# Patient Record
Sex: Male | Born: 1955 | Race: White | Hispanic: Yes | Marital: Married | Smoking: Former smoker
Health system: Southern US, Community
[De-identification: ages and names within clinical notes are randomized; demographics above are authoritative.]

## PROBLEM LIST (undated history)

## (undated) DIAGNOSIS — F32A Depression, unspecified: Secondary | ICD-10-CM

## (undated) DIAGNOSIS — R011 Cardiac murmur, unspecified: Secondary | ICD-10-CM

## (undated) DIAGNOSIS — R040 Epistaxis: Secondary | ICD-10-CM

## (undated) DIAGNOSIS — F988 Other specified behavioral and emotional disorders with onset usually occurring in childhood and adolescence: Secondary | ICD-10-CM

## (undated) DIAGNOSIS — Z87442 Personal history of urinary calculi: Secondary | ICD-10-CM

## (undated) DIAGNOSIS — E119 Type 2 diabetes mellitus without complications: Secondary | ICD-10-CM

## (undated) DIAGNOSIS — M1990 Unspecified osteoarthritis, unspecified site: Secondary | ICD-10-CM

## (undated) DIAGNOSIS — F429 Obsessive-compulsive disorder, unspecified: Secondary | ICD-10-CM

## (undated) DIAGNOSIS — F329 Major depressive disorder, single episode, unspecified: Secondary | ICD-10-CM

## (undated) DIAGNOSIS — Z9989 Dependence on other enabling machines and devices: Secondary | ICD-10-CM

## (undated) DIAGNOSIS — E785 Hyperlipidemia, unspecified: Secondary | ICD-10-CM

## (undated) DIAGNOSIS — IMO0001 Reserved for inherently not codable concepts without codable children: Secondary | ICD-10-CM

## (undated) DIAGNOSIS — I701 Atherosclerosis of renal artery: Secondary | ICD-10-CM

## (undated) DIAGNOSIS — G4730 Sleep apnea, unspecified: Secondary | ICD-10-CM

## (undated) DIAGNOSIS — N200 Calculus of kidney: Secondary | ICD-10-CM

## (undated) DIAGNOSIS — F419 Anxiety disorder, unspecified: Secondary | ICD-10-CM

## (undated) DIAGNOSIS — M5126 Other intervertebral disc displacement, lumbar region: Secondary | ICD-10-CM

## (undated) DIAGNOSIS — I10 Essential (primary) hypertension: Secondary | ICD-10-CM

## (undated) DIAGNOSIS — M51369 Other intervertebral disc degeneration, lumbar region without mention of lumbar back pain or lower extremity pain: Secondary | ICD-10-CM

## (undated) DIAGNOSIS — S2239XA Fracture of one rib, unspecified side, initial encounter for closed fracture: Secondary | ICD-10-CM

## (undated) DIAGNOSIS — M5136 Other intervertebral disc degeneration, lumbar region: Secondary | ICD-10-CM

## (undated) DIAGNOSIS — N400 Enlarged prostate without lower urinary tract symptoms: Secondary | ICD-10-CM

## (undated) DIAGNOSIS — G4733 Obstructive sleep apnea (adult) (pediatric): Secondary | ICD-10-CM

## (undated) HISTORY — DX: Unspecified osteoarthritis, unspecified site: M19.90

## (undated) HISTORY — DX: Dependence on other enabling machines and devices: Z99.89

## (undated) HISTORY — DX: Sleep apnea, unspecified: G47.30

## (undated) HISTORY — DX: Benign prostatic hyperplasia without lower urinary tract symptoms: N40.0

## (undated) HISTORY — PX: REPLACEMENT TOTAL KNEE BILATERAL: SUR1225

## (undated) HISTORY — DX: Depression, unspecified: F32.A

## (undated) HISTORY — DX: Reserved for inherently not codable concepts without codable children: IMO0001

## (undated) HISTORY — DX: Other specified behavioral and emotional disorders with onset usually occurring in childhood and adolescence: F98.8

## (undated) HISTORY — DX: Major depressive disorder, single episode, unspecified: F32.9

## (undated) HISTORY — DX: Calculus of kidney: N20.0

## (undated) HISTORY — DX: Hyperlipidemia, unspecified: E78.5

## (undated) HISTORY — DX: Anxiety disorder, unspecified: F41.9

## (undated) HISTORY — PX: NASAL SINUS SURGERY: SHX719

## (undated) HISTORY — DX: Obstructive sleep apnea (adult) (pediatric): G47.33

## (undated) HISTORY — DX: Type 2 diabetes mellitus without complications: E11.9

## (undated) HISTORY — PX: JOINT REPLACEMENT: SHX530

## (undated) HISTORY — PX: FRACTURE SURGERY: SHX138

## (undated) HISTORY — DX: Essential (primary) hypertension: I10

## (undated) HISTORY — DX: Cardiac murmur, unspecified: R01.1

## (undated) HISTORY — PX: MULTIPLE TOOTH EXTRACTIONS: SHX2053

## (undated) HISTORY — PX: LITHOTRIPSY: SUR834

## (undated) HISTORY — PX: FEMUR FRACTURE SURGERY: SHX633

## (undated) HISTORY — PX: KNEE SURGERY: SHX244

## (undated) HISTORY — PX: COLONOSCOPY: SHX174

---

## 1976-06-28 HISTORY — PX: DEROTATIONAL TIBIAL OSTEOTOMY: SHX1453

## 2003-04-05 ENCOUNTER — Encounter: Admission: RE | Admit: 2003-04-05 | Discharge: 2003-04-05 | Payer: Self-pay | Admitting: Family Medicine

## 2003-04-05 ENCOUNTER — Encounter: Payer: Self-pay | Admitting: Family Medicine

## 2004-12-24 ENCOUNTER — Encounter: Payer: Self-pay | Admitting: Internal Medicine

## 2006-03-03 ENCOUNTER — Ambulatory Visit (HOSPITAL_COMMUNITY): Admission: RE | Admit: 2006-03-03 | Discharge: 2006-03-03 | Payer: Self-pay | Admitting: Urology

## 2006-09-13 ENCOUNTER — Ambulatory Visit: Payer: Self-pay | Admitting: Cardiology

## 2006-09-13 LAB — CONVERTED CEMR LAB
BUN: 20 mg/dL (ref 6–23)
CO2: 29 meq/L (ref 19–32)
GFR calc Af Amer: 82 mL/min
Potassium: 3.9 meq/L (ref 3.5–5.1)
Sodium: 140 meq/L (ref 135–145)

## 2006-09-23 ENCOUNTER — Ambulatory Visit: Payer: Self-pay | Admitting: Cardiology

## 2006-09-23 LAB — CONVERTED CEMR LAB
BUN: 22 mg/dL (ref 6–23)
CO2: 25 meq/L (ref 19–32)
Calcium: 10.2 mg/dL (ref 8.4–10.5)
GFR calc Af Amer: 91 mL/min
GFR calc non Af Amer: 75 mL/min
Glucose, Bld: 155 mg/dL — ABNORMAL HIGH (ref 70–99)
Potassium: 4.3 meq/L (ref 3.5–5.1)

## 2006-10-06 ENCOUNTER — Ambulatory Visit: Payer: Self-pay | Admitting: Cardiology

## 2006-11-10 ENCOUNTER — Ambulatory Visit: Payer: Self-pay

## 2006-11-29 ENCOUNTER — Ambulatory Visit: Payer: Self-pay | Admitting: Internal Medicine

## 2006-11-29 ENCOUNTER — Encounter: Payer: Self-pay | Admitting: Internal Medicine

## 2006-11-29 DIAGNOSIS — M1990 Unspecified osteoarthritis, unspecified site: Secondary | ICD-10-CM

## 2006-11-29 DIAGNOSIS — E119 Type 2 diabetes mellitus without complications: Secondary | ICD-10-CM

## 2006-11-29 DIAGNOSIS — E785 Hyperlipidemia, unspecified: Secondary | ICD-10-CM

## 2006-11-29 DIAGNOSIS — I10 Essential (primary) hypertension: Secondary | ICD-10-CM

## 2006-11-29 LAB — CONVERTED CEMR LAB
Cholesterol, target level: 200 mg/dL
HDL goal, serum: 40 mg/dL
LDL Goal: 100 mg/dL

## 2006-12-08 ENCOUNTER — Observation Stay (HOSPITAL_COMMUNITY): Admission: EM | Admit: 2006-12-08 | Discharge: 2006-12-09 | Payer: Self-pay | Admitting: Emergency Medicine

## 2006-12-12 ENCOUNTER — Ambulatory Visit (HOSPITAL_COMMUNITY): Admission: RE | Admit: 2006-12-12 | Discharge: 2006-12-12 | Payer: Self-pay | Admitting: Urology

## 2007-01-03 ENCOUNTER — Ambulatory Visit: Payer: Self-pay | Admitting: Cardiology

## 2007-01-04 ENCOUNTER — Encounter: Payer: Self-pay | Admitting: Internal Medicine

## 2007-01-04 ENCOUNTER — Ambulatory Visit (HOSPITAL_BASED_OUTPATIENT_CLINIC_OR_DEPARTMENT_OTHER): Admission: RE | Admit: 2007-01-04 | Discharge: 2007-01-04 | Payer: Self-pay | Admitting: Internal Medicine

## 2007-01-10 ENCOUNTER — Ambulatory Visit: Payer: Self-pay | Admitting: Internal Medicine

## 2007-01-16 ENCOUNTER — Ambulatory Visit: Payer: Self-pay | Admitting: Gastroenterology

## 2007-01-18 ENCOUNTER — Ambulatory Visit: Payer: Self-pay | Admitting: Pulmonary Disease

## 2007-01-25 ENCOUNTER — Ambulatory Visit: Payer: Self-pay | Admitting: Internal Medicine

## 2007-01-25 DIAGNOSIS — Z87442 Personal history of urinary calculi: Secondary | ICD-10-CM

## 2007-01-25 LAB — HM DIABETES FOOT EXAM

## 2007-01-27 ENCOUNTER — Ambulatory Visit: Payer: Self-pay | Admitting: Gastroenterology

## 2007-01-27 LAB — HM COLONOSCOPY: HM Colonoscopy: NORMAL

## 2007-02-10 ENCOUNTER — Ambulatory Visit: Payer: Self-pay | Admitting: Internal Medicine

## 2007-02-14 ENCOUNTER — Encounter: Payer: Self-pay | Admitting: Internal Medicine

## 2007-02-21 ENCOUNTER — Telehealth: Payer: Self-pay | Admitting: Internal Medicine

## 2007-02-21 ENCOUNTER — Encounter: Admission: RE | Admit: 2007-02-21 | Discharge: 2007-05-22 | Payer: Self-pay | Admitting: Internal Medicine

## 2007-02-21 ENCOUNTER — Encounter: Payer: Self-pay | Admitting: Internal Medicine

## 2007-02-28 LAB — CONVERTED CEMR LAB
ALT: 40 units/L (ref 0–53)
AST: 26 units/L (ref 0–37)
Albumin: 4.2 g/dL (ref 3.5–5.2)
Alkaline Phosphatase: 42 units/L (ref 39–117)
BUN: 21 mg/dL (ref 6–23)
Bilirubin, Direct: 0.1 mg/dL (ref 0.0–0.3)
CO2: 26 meq/L (ref 19–32)
Calcium: 9.3 mg/dL (ref 8.4–10.5)
Chloride: 106 meq/L (ref 96–112)
Cholesterol: 136 mg/dL (ref 0–200)
Creatinine, Ser: 1.2 mg/dL (ref 0.4–1.5)
GFR calc Af Amer: 82 mL/min
GFR calc non Af Amer: 68 mL/min
Glucose, Bld: 139 mg/dL — ABNORMAL HIGH (ref 70–99)
HDL: 25.6 mg/dL — ABNORMAL LOW (ref >39.0)
LDL Cholesterol: 15 mg/dL (ref 0–99)
Potassium: 4.2 meq/L (ref 3.5–5.1)
Sodium: 141 meq/L (ref 135–145)
Total Bilirubin: 1.1 mg/dL (ref 0.3–1.2)
Total CHOL/HDL Ratio: 5.3
Total Protein: 7.3 g/dL (ref 6.0–8.3)
Triglycerides: 476 mg/dL (ref 0–149)
VLDL: 95 mg/dL — ABNORMAL HIGH (ref 0–40)

## 2007-03-25 ENCOUNTER — Encounter: Admission: RE | Admit: 2007-03-25 | Discharge: 2007-03-25 | Payer: Self-pay | Admitting: Otolaryngology

## 2007-03-27 ENCOUNTER — Ambulatory Visit: Payer: Self-pay | Admitting: Pulmonary Disease

## 2007-04-21 DIAGNOSIS — G4733 Obstructive sleep apnea (adult) (pediatric): Secondary | ICD-10-CM | POA: Insufficient documentation

## 2007-04-24 ENCOUNTER — Encounter: Payer: Self-pay | Admitting: Internal Medicine

## 2007-04-29 LAB — CONVERTED CEMR LAB: PSA: NORMAL ng/mL

## 2007-05-15 ENCOUNTER — Ambulatory Visit: Payer: Self-pay | Admitting: Internal Medicine

## 2007-05-15 LAB — CONVERTED CEMR LAB
ALT: 30 units/L (ref 0–53)
AST: 23 units/L (ref 0–37)
Albumin: 4.5 g/dL (ref 3.5–5.2)
Alkaline Phosphatase: 47 units/L (ref 39–117)
BUN: 33 mg/dL — ABNORMAL HIGH (ref 6–23)
Bilirubin, Direct: 0.2 mg/dL (ref 0.0–0.3)
CO2: 26 meq/L (ref 19–32)
Calcium: 9.7 mg/dL (ref 8.4–10.5)
Chloride: 103 meq/L (ref 96–112)
Cholesterol: 126 mg/dL (ref 0–200)
Creatinine, Ser: 1.5 mg/dL (ref 0.4–1.5)
Direct LDL: 54.1 mg/dL
GFR calc Af Amer: 63 mL/min
GFR calc non Af Amer: 52 mL/min
Glucose, Bld: 188 mg/dL — ABNORMAL HIGH (ref 70–99)
HDL: 23.9 mg/dL — ABNORMAL LOW (ref >39.0)
Hgb A1c MFr Bld: 6.8 % — ABNORMAL HIGH (ref 4.6–6.0)
Potassium: 5 meq/L (ref 3.5–5.1)
Sodium: 137 meq/L (ref 135–145)
Total Bilirubin: 1 mg/dL (ref 0.3–1.2)
Total CHOL/HDL Ratio: 5.3
Total Protein: 7.7 g/dL (ref 6.0–8.3)
Triglycerides: 302 mg/dL (ref 0–149)
VLDL: 60 mg/dL — ABNORMAL HIGH (ref 0–40)

## 2007-05-22 ENCOUNTER — Ambulatory Visit: Payer: Self-pay | Admitting: Internal Medicine

## 2007-05-22 DIAGNOSIS — F329 Major depressive disorder, single episode, unspecified: Secondary | ICD-10-CM

## 2007-05-22 DIAGNOSIS — F419 Anxiety disorder, unspecified: Secondary | ICD-10-CM | POA: Insufficient documentation

## 2007-06-15 ENCOUNTER — Observation Stay (HOSPITAL_COMMUNITY): Admission: RE | Admit: 2007-06-15 | Discharge: 2007-06-16 | Payer: Self-pay | Admitting: Surgery

## 2007-06-15 ENCOUNTER — Encounter (INDEPENDENT_AMBULATORY_CARE_PROVIDER_SITE_OTHER): Payer: Self-pay | Admitting: Otolaryngology

## 2007-07-28 ENCOUNTER — Ambulatory Visit: Payer: Self-pay | Admitting: Internal Medicine

## 2007-08-01 ENCOUNTER — Telehealth: Payer: Self-pay | Admitting: Internal Medicine

## 2007-09-20 ENCOUNTER — Ambulatory Visit: Payer: Self-pay | Admitting: Internal Medicine

## 2007-09-20 LAB — CONVERTED CEMR LAB
AST: 30 units/L (ref 0–37)
Albumin: 4.6 g/dL (ref 3.5–5.2)
Alkaline Phosphatase: 44 units/L (ref 39–117)
BUN: 27 mg/dL — ABNORMAL HIGH (ref 6–23)
Cholesterol: 170 mg/dL (ref 0–200)
Creatinine, Ser: 1.3 mg/dL (ref 0.4–1.5)
GFR calc Af Amer: 75 mL/min
Glucose, Bld: 210 mg/dL — ABNORMAL HIGH (ref 70–99)
HDL: 30.8 mg/dL — ABNORMAL LOW (ref >39.0)
Hgb A1c MFr Bld: 7.9 % — ABNORMAL HIGH (ref 4.6–6.0)
Potassium: 5.3 meq/L — ABNORMAL HIGH (ref 3.5–5.1)
Total Protein: 7.3 g/dL (ref 6.0–8.3)
VLDL: 101 mg/dL — ABNORMAL HIGH (ref 0–40)

## 2007-09-27 ENCOUNTER — Ambulatory Visit: Payer: Self-pay | Admitting: Internal Medicine

## 2007-09-28 ENCOUNTER — Ambulatory Visit: Payer: Self-pay | Admitting: Internal Medicine

## 2007-10-04 ENCOUNTER — Telehealth: Payer: Self-pay | Admitting: Internal Medicine

## 2007-10-05 ENCOUNTER — Telehealth: Payer: Self-pay | Admitting: Internal Medicine

## 2007-10-11 ENCOUNTER — Telehealth: Payer: Self-pay | Admitting: Internal Medicine

## 2007-10-16 ENCOUNTER — Encounter: Payer: Self-pay | Admitting: Internal Medicine

## 2008-01-22 ENCOUNTER — Ambulatory Visit: Payer: Self-pay | Admitting: Internal Medicine

## 2008-01-22 LAB — CONVERTED CEMR LAB
ALT: 43 units/L (ref 0–53)
AST: 31 units/L (ref 0–37)
Alkaline Phosphatase: 49 units/L (ref 39–117)
Bilirubin, Direct: 0.1 mg/dL (ref 0.0–0.3)
CO2: 26 meq/L (ref 19–32)
Chloride: 103 meq/L (ref 96–112)
Glucose, Bld: 195 mg/dL — ABNORMAL HIGH (ref 70–99)
Sodium: 139 meq/L (ref 135–145)
Total Bilirubin: 0.8 mg/dL (ref 0.3–1.2)
Total CHOL/HDL Ratio: 4.8

## 2008-01-29 ENCOUNTER — Ambulatory Visit: Payer: Self-pay | Admitting: Internal Medicine

## 2008-02-05 ENCOUNTER — Telehealth: Payer: Self-pay | Admitting: Internal Medicine

## 2008-02-16 ENCOUNTER — Ambulatory Visit: Payer: Self-pay | Admitting: Internal Medicine

## 2008-02-16 LAB — CONVERTED CEMR LAB
BUN: 28 mg/dL — ABNORMAL HIGH (ref 6–23)
CO2: 26 meq/L (ref 19–32)
Chloride: 105 meq/L (ref 96–112)
Creatinine, Ser: 1.4 mg/dL (ref 0.4–1.5)
Glucose, Bld: 180 mg/dL — ABNORMAL HIGH (ref 70–99)

## 2008-02-20 ENCOUNTER — Telehealth: Payer: Self-pay | Admitting: Internal Medicine

## 2008-07-22 IMAGING — CT CT ABDOMEN W/O CM
1 series · 15 of 32 positions shown, 19 images · IV contrast (agent unspecified)
Comparison: None.

CLINICAL DATA: 50-year-old with left flank pain and hematuria.  
 ABDOMEN CT WITHOUT CONTRAST:
TECHNIQUE: Multidetector CT imaging of the abdomen was performed following the standard protocol without IV contrast.
TECHNIQUE: Multidetector CT imaging of the pelvis was performed following the standard protocol without IV contrast.

[Series 2: stone_wo 5.0 b40f st · axial · 0.88mm/px · z∈[-474,-70]mm · 15 of 113 slices shown, 19 images]
[im 8/113  soft-tissue]
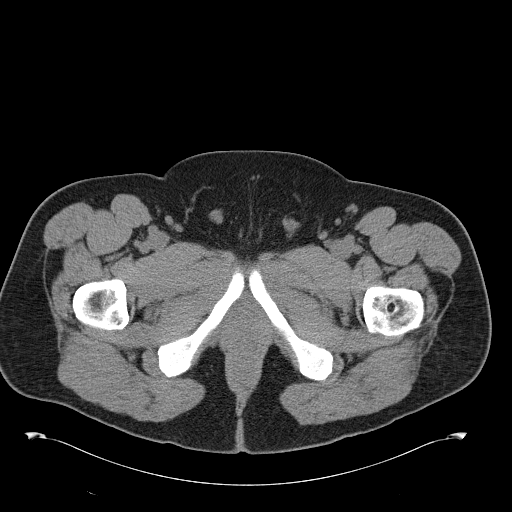
[im 8/113  bone]
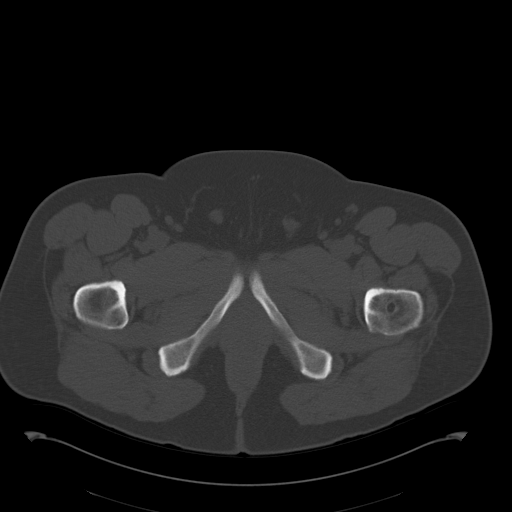
[im 15/113  soft-tissue]
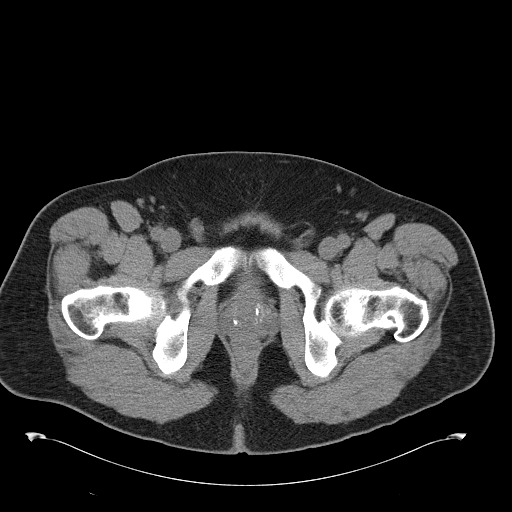
[im 22/113  soft-tissue]
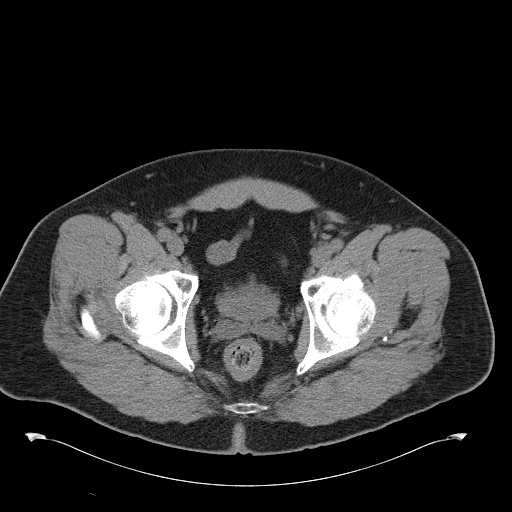
[im 33/113  soft-tissue]
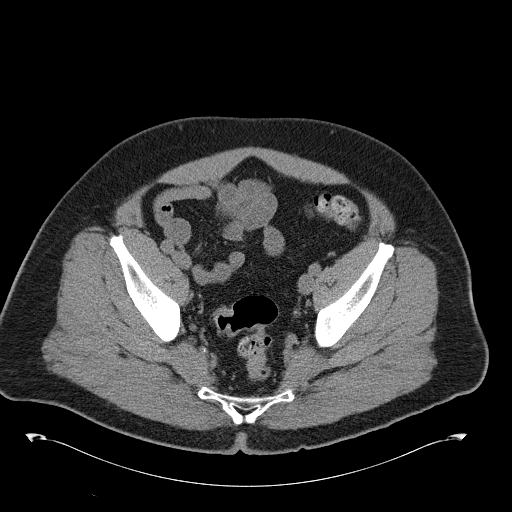
[im 40/113  soft-tissue]
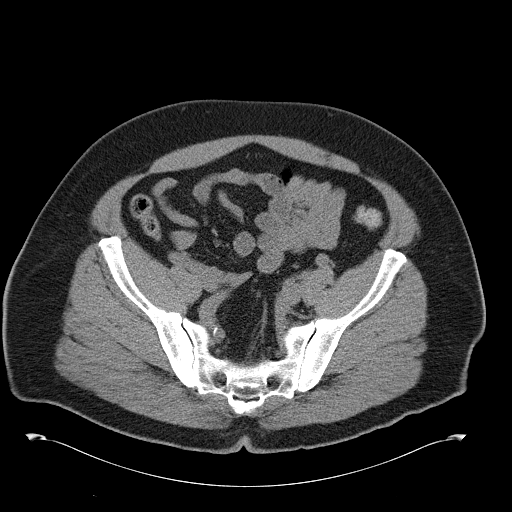
[im 47/113  soft-tissue]
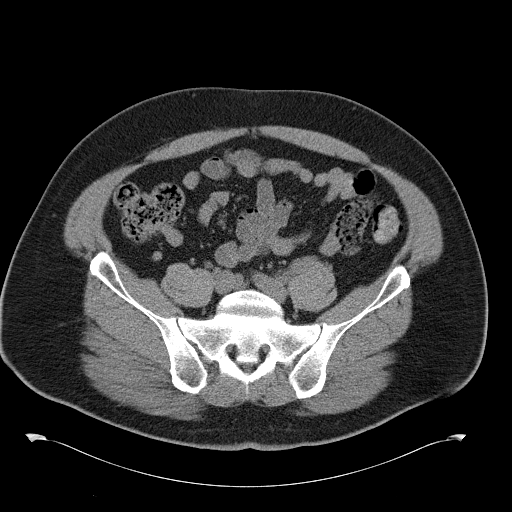
[im 58/113  soft-tissue]
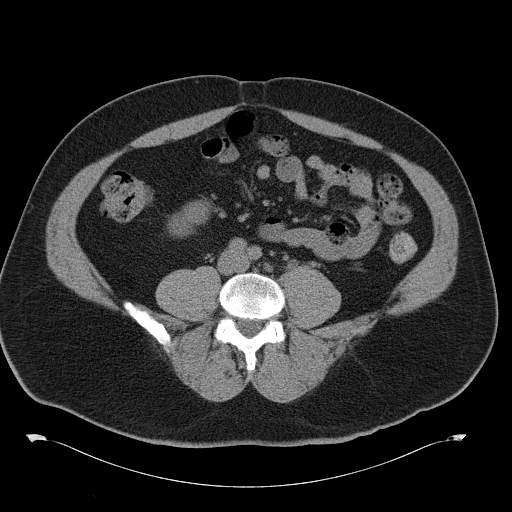
[im 66/113  soft-tissue]
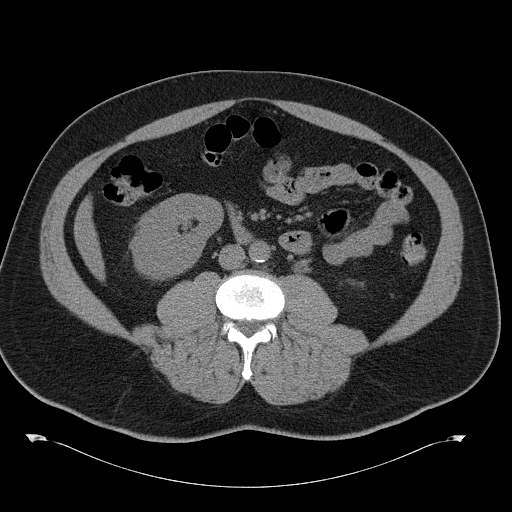
[im 73/113  soft-tissue]
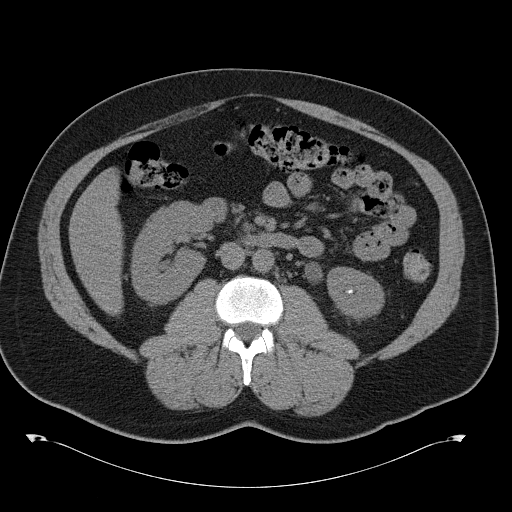
[im 73/113  bone]
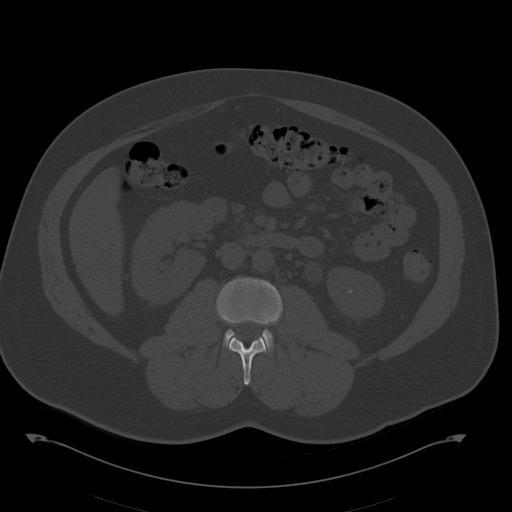
[im 80/113  soft-tissue]
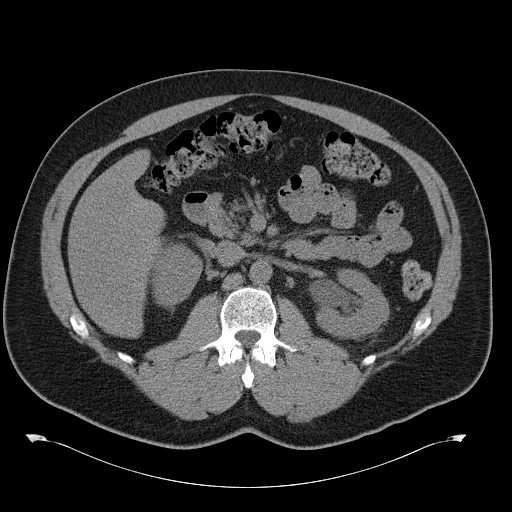
[im 91/113  soft-tissue]
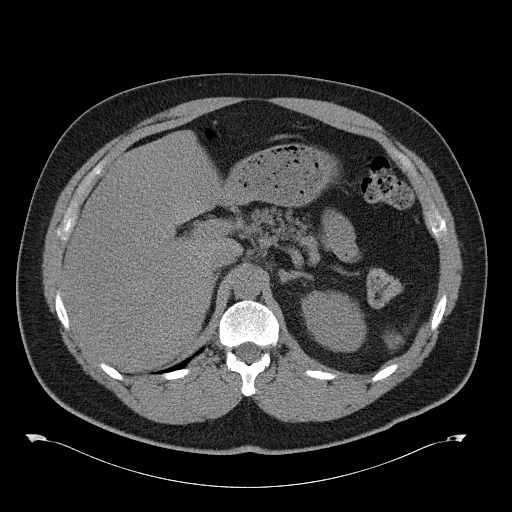
[im 98/113  soft-tissue]
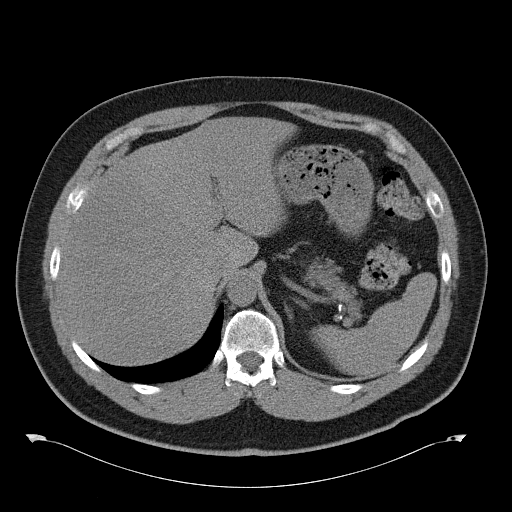
[im 98/113  lung]
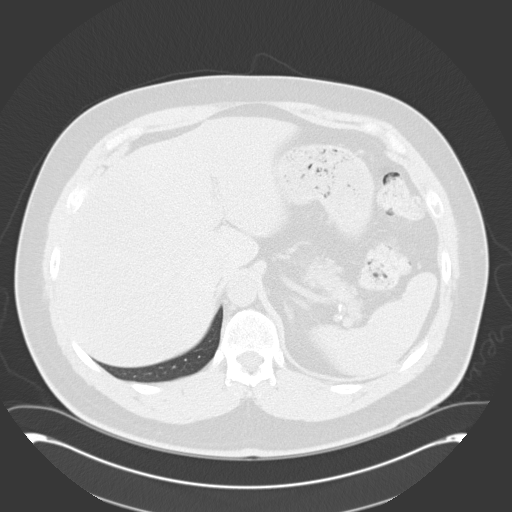
[im 102/113  lung]
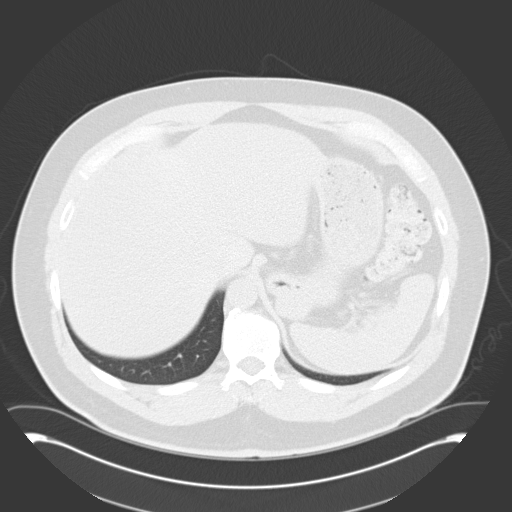
[im 105/113  soft-tissue]
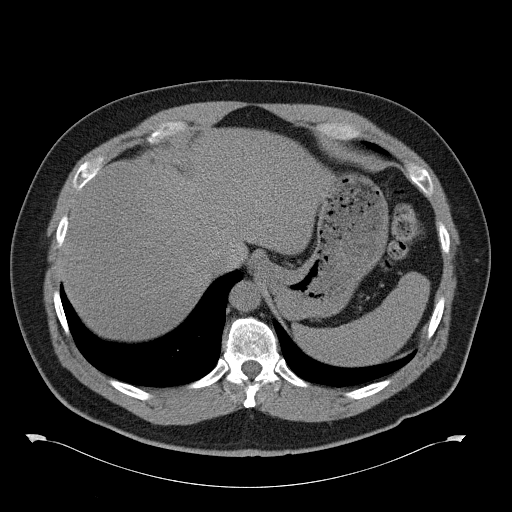
[im 105/113  lung]
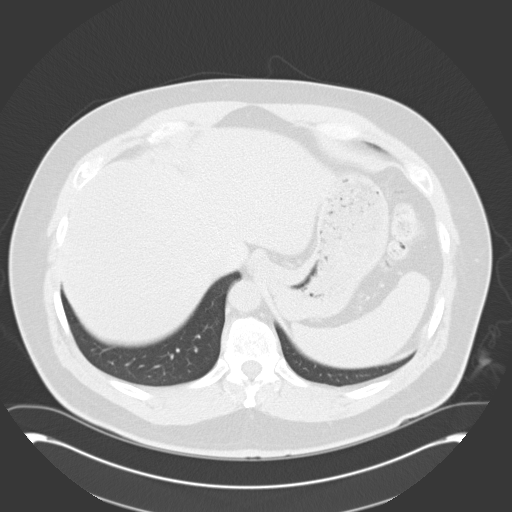
[im 109/113  lung]
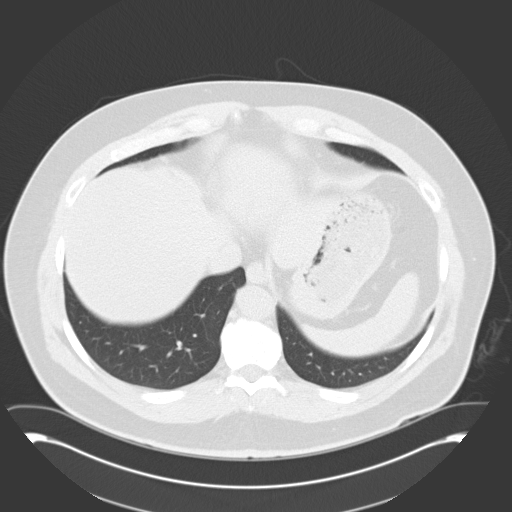

[15 of 32 positions shown; findings below may reference images not displayed]

FINDINGS: The lung bases are clear.  
 The unenhanced appearance of the liver and spleen are unremarkable.  Gallbladder is slightly contracted.  The pancreas demonstrates no abnormalities.  The adrenal glands are normal.  Left kidney is normal.  There is a small lower pole left renal calculus and there is moderate hydronephrosis of the left kidney.  The right ureter is dilated down to a large, 10.0 x 9.8 x 8.0 mm calculus which is located near the L5-S1 disc space.  Below this, the ureter appears normal.  There may be a tiny stone fragment just above the large dome.  Right ureter is normal.  The appendix is visualized and is normal.  The aorta is normal in caliber.  Stomach, duodenum, small bowel and colon are grossly normal.  The study is limited without oral contrast.
IMPRESSION: 1.  10.0 x 9.8 x 8.0 mm calculus in the left mid ureter at the L5-S1 disc space level causing moderate grade obstruction. 
 2.  Lower pole left renal calculus. 
 3.  Remainder of the abdomen is unremarkable.  
 PELVIS CT WITHOUT CONTRAST:
FINDINGS: The rectum, sigmoid colon, and visualized small bowel loops are unremarkable.  Mild diverticulosis of the sigmoid colon.  No distal ureteral calculi or bladder calculi.  
 No significant bony findings.
IMPRESSION: Unremarkable CT pelvis.

## 2008-09-11 ENCOUNTER — Encounter: Payer: Self-pay | Admitting: Cardiology

## 2008-09-11 ENCOUNTER — Ambulatory Visit: Payer: Self-pay | Admitting: Cardiology

## 2008-09-18 ENCOUNTER — Ambulatory Visit: Payer: Self-pay | Admitting: Cardiology

## 2008-09-18 LAB — CONVERTED CEMR LAB
BUN: 20 mg/dL (ref 6–23)
CO2: 29 meq/L (ref 19–32)
Calcium: 9.2 mg/dL (ref 8.4–10.5)
Creatinine, Ser: 1.2 mg/dL (ref 0.4–1.5)
Glucose, Bld: 212 mg/dL — ABNORMAL HIGH (ref 70–99)
Sodium: 138 meq/L (ref 135–145)

## 2008-10-08 ENCOUNTER — Ambulatory Visit: Payer: Self-pay | Admitting: Internal Medicine

## 2008-10-10 ENCOUNTER — Telehealth (INDEPENDENT_AMBULATORY_CARE_PROVIDER_SITE_OTHER): Payer: Self-pay | Admitting: *Deleted

## 2008-10-10 ENCOUNTER — Ambulatory Visit: Payer: Self-pay

## 2008-10-10 ENCOUNTER — Encounter: Payer: Self-pay | Admitting: Cardiology

## 2008-10-10 ENCOUNTER — Ambulatory Visit: Payer: Self-pay | Admitting: Cardiology

## 2008-10-10 LAB — CONVERTED CEMR LAB
AST: 33 units/L (ref 0–37)
BUN: 28 mg/dL — ABNORMAL HIGH (ref 6–23)
Basophils Relative: 0.6 % (ref 0.0–3.0)
CO2: 24 meq/L (ref 19–32)
Chloride: 102 meq/L (ref 96–112)
Creatinine, Ser: 1.2 mg/dL (ref 0.4–1.5)
Eosinophils Absolute: 0.2 10*3/uL (ref 0.0–0.7)
Hemoglobin: 14.6 g/dL (ref 13.0–17.0)
Lymphocytes Relative: 33.1 % (ref 12.0–46.0)
MCHC: 34.2 g/dL (ref 30.0–36.0)
Neutro Abs: 3.4 10*3/uL (ref 1.4–7.7)
RBC: 4.75 M/uL (ref 4.22–5.81)

## 2008-10-21 ENCOUNTER — Encounter: Payer: Self-pay | Admitting: Internal Medicine

## 2008-10-22 ENCOUNTER — Telehealth (INDEPENDENT_AMBULATORY_CARE_PROVIDER_SITE_OTHER): Payer: Self-pay | Admitting: *Deleted

## 2008-11-26 ENCOUNTER — Telehealth (INDEPENDENT_AMBULATORY_CARE_PROVIDER_SITE_OTHER): Payer: Self-pay | Admitting: *Deleted

## 2008-11-26 ENCOUNTER — Ambulatory Visit: Payer: Self-pay | Admitting: Cardiology

## 2008-11-27 LAB — CONVERTED CEMR LAB
Bilirubin, Direct: 0.1 mg/dL (ref 0.0–0.3)
Eosinophils Absolute: 0.3 10*3/uL (ref 0.0–0.7)
MCHC: 35.3 g/dL (ref 30.0–36.0)
MCV: 87.5 fL (ref 78.0–100.0)
Monocytes Absolute: 0.9 10*3/uL (ref 0.1–1.0)
Neutrophils Relative %: 52.4 % (ref 43.0–77.0)
Platelets: 304 10*3/uL (ref 150.0–400.0)
RDW: 12.4 % (ref 11.5–14.6)
Total Bilirubin: 1.1 mg/dL (ref 0.3–1.2)
WBC: 7.4 10*3/uL (ref 4.5–10.5)

## 2008-12-25 LAB — CONVERTED CEMR LAB: PSA: 0.51 ng/mL

## 2009-01-06 ENCOUNTER — Telehealth (INDEPENDENT_AMBULATORY_CARE_PROVIDER_SITE_OTHER): Payer: Self-pay | Admitting: *Deleted

## 2009-01-17 ENCOUNTER — Telehealth (INDEPENDENT_AMBULATORY_CARE_PROVIDER_SITE_OTHER): Payer: Self-pay | Admitting: *Deleted

## 2009-01-22 ENCOUNTER — Ambulatory Visit: Payer: Self-pay | Admitting: Internal Medicine

## 2009-01-27 ENCOUNTER — Telehealth: Payer: Self-pay | Admitting: Internal Medicine

## 2009-01-27 LAB — CONVERTED CEMR LAB
CO2: 26 meq/L (ref 19–32)
Calcium: 9.4 mg/dL (ref 8.4–10.5)
Chloride: 108 meq/L (ref 96–112)
Cholesterol: 133 mg/dL (ref 0–200)
Creatinine,U: 148.5 mg/dL
Direct LDL: 48.1 mg/dL
Hgb A1c MFr Bld: 8 % — ABNORMAL HIGH (ref 4.6–6.5)
Microalb, Ur: 5.2 mg/dL — ABNORMAL HIGH (ref 0.0–1.9)
Sodium: 139 meq/L (ref 135–145)
Triglycerides: 405 mg/dL — ABNORMAL HIGH (ref 0.0–149.0)

## 2009-01-28 ENCOUNTER — Encounter: Payer: Self-pay | Admitting: Internal Medicine

## 2009-01-28 LAB — HM DIABETES EYE EXAM: HM Diabetic Eye Exam: NORMAL

## 2009-02-12 ENCOUNTER — Encounter: Payer: Self-pay | Admitting: Internal Medicine

## 2009-02-24 ENCOUNTER — Encounter: Payer: Self-pay | Admitting: Internal Medicine

## 2009-04-17 ENCOUNTER — Ambulatory Visit: Payer: Self-pay | Admitting: Internal Medicine

## 2009-04-21 ENCOUNTER — Telehealth: Payer: Self-pay | Admitting: Internal Medicine

## 2009-04-25 ENCOUNTER — Ambulatory Visit: Payer: Self-pay | Admitting: Internal Medicine

## 2009-05-01 ENCOUNTER — Telehealth (INDEPENDENT_AMBULATORY_CARE_PROVIDER_SITE_OTHER): Payer: Self-pay | Admitting: *Deleted

## 2009-05-12 ENCOUNTER — Encounter (INDEPENDENT_AMBULATORY_CARE_PROVIDER_SITE_OTHER): Payer: Self-pay | Admitting: *Deleted

## 2009-05-30 ENCOUNTER — Ambulatory Visit: Payer: Self-pay | Admitting: Internal Medicine

## 2009-08-29 ENCOUNTER — Ambulatory Visit: Payer: Self-pay | Admitting: Internal Medicine

## 2009-09-03 LAB — CONVERTED CEMR LAB
CO2: 28 meq/L (ref 19–32)
Calcium: 9.8 mg/dL (ref 8.4–10.5)
Chloride: 106 meq/L (ref 96–112)
Cholesterol: 132 mg/dL (ref 0–200)
Glucose, Bld: 132 mg/dL — ABNORMAL HIGH (ref 70–99)
HDL: 40.7 mg/dL (ref >39.00)
Sodium: 141 meq/L (ref 135–145)
Total CHOL/HDL Ratio: 3
Triglycerides: 126 mg/dL (ref 0.0–149.0)

## 2009-10-14 ENCOUNTER — Encounter: Payer: Self-pay | Admitting: Internal Medicine

## 2009-10-25 ENCOUNTER — Encounter: Admission: RE | Admit: 2009-10-25 | Discharge: 2009-10-25 | Payer: Self-pay | Admitting: Orthopedic Surgery

## 2009-11-11 ENCOUNTER — Telehealth (INDEPENDENT_AMBULATORY_CARE_PROVIDER_SITE_OTHER): Payer: Self-pay | Admitting: *Deleted

## 2009-12-31 ENCOUNTER — Ambulatory Visit: Payer: Self-pay | Admitting: Internal Medicine

## 2010-01-01 LAB — CONVERTED CEMR LAB
BUN: 28 mg/dL — ABNORMAL HIGH (ref 6–23)
Basophils Relative: 0.6 % (ref 0.0–3.0)
Creatinine, Ser: 1.2 mg/dL (ref 0.4–1.5)
Eosinophils Absolute: 0.3 10*3/uL (ref 0.0–0.7)
Eosinophils Relative: 4 % (ref 0.0–5.0)
GFR calc non Af Amer: 70.46 mL/min (ref >60)
Glucose, Bld: 190 mg/dL — ABNORMAL HIGH (ref 70–99)
HCT: 42.9 % (ref 39.0–52.0)
Hemoglobin: 14.9 g/dL (ref 13.0–17.0)
Lymphs Abs: 2 10*3/uL (ref 0.7–4.0)
MCHC: 34.8 g/dL (ref 30.0–36.0)
MCV: 89.6 fL (ref 78.0–100.0)
Monocytes Absolute: 0.8 10*3/uL (ref 0.1–1.0)
Neutro Abs: 5.2 10*3/uL (ref 1.4–7.7)
Neutrophils Relative %: 62.7 % (ref 43.0–77.0)
RBC: 4.79 M/uL (ref 4.22–5.81)
WBC: 8.4 10*3/uL (ref 4.5–10.5)

## 2010-01-13 ENCOUNTER — Encounter: Payer: Self-pay | Admitting: Internal Medicine

## 2010-02-09 ENCOUNTER — Ambulatory Visit: Payer: Self-pay | Admitting: Pulmonary Disease

## 2010-02-25 ENCOUNTER — Ambulatory Visit: Payer: Self-pay | Admitting: Cardiology

## 2010-03-25 ENCOUNTER — Encounter: Payer: Self-pay | Admitting: Pulmonary Disease

## 2010-03-26 ENCOUNTER — Ambulatory Visit: Payer: Self-pay | Admitting: Cardiology

## 2010-03-26 ENCOUNTER — Ambulatory Visit: Payer: Self-pay

## 2010-03-26 DIAGNOSIS — R9431 Abnormal electrocardiogram [ECG] [EKG]: Secondary | ICD-10-CM

## 2010-03-30 ENCOUNTER — Telehealth (INDEPENDENT_AMBULATORY_CARE_PROVIDER_SITE_OTHER): Payer: Self-pay | Admitting: *Deleted

## 2010-03-31 ENCOUNTER — Ambulatory Visit: Payer: Self-pay | Admitting: Cardiology

## 2010-03-31 ENCOUNTER — Ambulatory Visit: Payer: Self-pay

## 2010-03-31 ENCOUNTER — Encounter: Payer: Self-pay | Admitting: Cardiovascular Disease

## 2010-03-31 ENCOUNTER — Encounter: Payer: Self-pay | Admitting: Cardiology

## 2010-03-31 ENCOUNTER — Encounter (HOSPITAL_COMMUNITY): Admission: RE | Admit: 2010-03-31 | Discharge: 2010-04-13 | Payer: Self-pay | Admitting: Cardiology

## 2010-03-31 ENCOUNTER — Encounter (INDEPENDENT_AMBULATORY_CARE_PROVIDER_SITE_OTHER): Payer: Self-pay | Admitting: *Deleted

## 2010-05-04 ENCOUNTER — Ambulatory Visit: Payer: Self-pay | Admitting: Cardiology

## 2010-05-06 ENCOUNTER — Encounter: Payer: Self-pay | Admitting: Cardiology

## 2010-05-06 LAB — CONVERTED CEMR LAB
Albumin: 4.4 g/dL (ref 3.5–5.2)
Cholesterol: 129 mg/dL (ref 0–200)
Total Bilirubin: 0.9 mg/dL (ref 0.3–1.2)
Total CHOL/HDL Ratio: 4
Triglycerides: 207 mg/dL — ABNORMAL HIGH (ref 0.0–149.0)
VLDL: 41.4 mg/dL — ABNORMAL HIGH (ref 0.0–40.0)

## 2010-06-03 ENCOUNTER — Encounter: Payer: Self-pay | Admitting: Pulmonary Disease

## 2010-06-09 ENCOUNTER — Ambulatory Visit: Payer: Self-pay | Admitting: Internal Medicine

## 2010-07-20 ENCOUNTER — Encounter: Payer: Self-pay | Admitting: Otolaryngology

## 2010-07-24 ENCOUNTER — Telehealth (INDEPENDENT_AMBULATORY_CARE_PROVIDER_SITE_OTHER): Payer: Self-pay | Admitting: *Deleted

## 2010-07-30 NOTE — Letter (Signed)
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 [address]   [place], [place] [address]   Phone: [phone]  Fax: [phone]         May 06, 2010 MRN: [id number]   Leonard [name] [address] [place], [place]  [address]   Dear Mr. Stehle,  We have reviewed your cholesterol results.  They are as follows:     Total Cholesterol:    129 (Desirable: less than 200)       HDL  Cholesterol:     33.40  (Desirable: greater than 40 for men and 50 for women)       LDL Cholesterol:       66  (Desirable: less than 100 for low risk and less than 70 for moderate to high risk)       Triglycerides:       207.0  (Desirable: less than 150)  Our recommendations include:  No change in treatment, continue current medications and healthy life style.  Hope all is well with you!   Call our office at the number listed above if you have any questions.  Lowering your LDL cholesterol is important, but it is only one of a large number of "risk factors" that may indicate that you are at risk for heart disease, stroke or other complications of hardening of the arteries.  Other risk factors include:   A.  Cigarette Smoking* B.  High Blood Pressure* C.  Obesity* D.   Low HDL Cholesterol (see yours above)* E.   Diabetes Mellitus (higher risk if your is uncontrolled) F.  Family history of premature heart disease G.  Previous history of stroke or cardiovascular disease    *These are risk factors YOU HAVE CONTROL OVER.  For more information, visit .      There is now evidence that lowering the TOTAL CHOLESTEROL AND LDL CHOLESTEROL can reduce the risk of heart disease.  The American Heart Association recommends the following guidelines for the treatment of elevated cholesterol:  1.  If there is now current heart disease and less than two risk factors, TOTAL CHOLESTEROL should be less than 200 and LDL CHOLESTEROL should be less than 100. 2.  If there is current heart disease  or two or more risk factors, TOTAL CHOLESTEROL should be less than 200 and LDL CHOLESTEROL should be less than 70.  A diet low in cholesterol, saturated fat, and calories is the cornerstone of treatment for elevated cholesterol.  Cessation of smoking and exercise are also important in the management of elevated cholesterol and preventing vascular disease.  Studies have shown that 30 to 60 minutes of physical activity most days can help lower blood pressure, lower cholesterol, and keep your weight at a healthy level.  Drug therapy is used when cholesterol levels do not respond to therapeutic lifestyle changes (smoking cessation, diet, and exercise) and remains unacceptably high.  If medication is started, it is important to have you levels checked periodically to evaluate the need for further treatment options.      Thank you,     Sander Nephew, RN for Dr. Rollene Rotunda [facility] Team
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
## 2010-07-30 NOTE — Miscellaneous (Signed)
Summary: order for GXT  Clinical Lists Changes  Orders: Added new Referral order of Treadmill (Treadmill) - Signed

## 2010-07-30 NOTE — Letter (Signed)
Summary: CMN for CPAP/[facility]  CMN for CPAP/[facility]   Imported By: Lanelle Bal 10/16/2009 10:02:44  _____________________________________________________________________  External Attachment:    Type:   Image     Comment:   External Document

## 2010-07-30 NOTE — Assessment & Plan Note (Signed)
Summary: Cardiology Nuclear Testing  Nuclear Med Background Indications for Stress Test: Evaluation for Ischemia  Indications Comments: Abnormal GXT  History: GXT, Myocardial Perfusion Study  History Comments: '08 [id number]   Symptoms Comments: No cardiac complaints   Nuclear Pre-Procedure Cardiac Risk Factors: History of Smoking, Hypertension, Lipids, NIDDM, Obesity Caffeine/Decaff Intake: none NPO After: 6:30 PM Lungs: Clear IV 0.9% NS with Angio Cath: 22g     IV Site: R Wrist IV Started by: Cathlyn Parsons, RN Chest Size (in) 46     Height (in): 72 Weight (lb): 253 BMI: 34.44  Nuclear Med Study 1 or 2 day study:  1 day     Stress Test Type:  Stress Reading MD:  Peter Swaziland, MD     Referring MD:  Rollene Rotunda, MD Resting Radionuclide:  Technetium 41m Tetrofosmin     Resting Radionuclide Dose:  11 mCi  Stress Radionuclide:  Technetium 85m Tetrofosmin     Stress Radionuclide Dose:  33 mCi   Stress Protocol Exercise Time (min):  12:45 min     Max HR:  179 bpm     Predicted Max HR:  166 bpm  Max Systolic BP: 192 mm Hg     Percent Max HR:  107.83 %     METS: 14.7 Rate Pressure Product:  14782    Stress Test Technologist:  [employer], CMA-N     Nuclear Technologist:  Domenic Polite, CNMT  Rest Procedure  Myocardial perfusion imaging was performed at rest 45 minutes following the intravenous administration of Technetium 42m Tetrofosmin.  Stress Procedure  The patient exercised for 12:45.  The patient stopped due to fatigue and denied any chest pain.  There were ST-T wave changes with exercise and occasional PVC's with rare couplet, occasional PAC's.  Technetium 53m Tetrofosmin was injected at peak exercise and myocardial perfusion imaging was performed after a brief delay.  QPS Raw Data Images:  Normal; no motion artifact; normal heart/lung ratio. Stress Images:  Mild apical thinning, otherwise normal. Rest Images:  Mild apical thinning, otherwise  normal. Subtraction (SDS):  Normal Transient Ischemic Dilatation:  .95  (Normal <1.22)  Lung/Heart Ratio:  .31  (Normal <0.45)  Quantitative Gated Spect Images QGS EDV:  139 ml QGS ESV:  66 ml QGS EF:  52 % QGS cine images:  Normal wall motion.  Findings Normal nuclear study      Overall Impression  Exercise Capacity: Excellent exercise capacity. BP Response: Hypertensive blood pressure response. Clinical Symptoms: No chest pain ECG Impression: Significant ST abnormalities consistent with ischemia. Overall Impression: Normal stress nuclear study. Overall Impression Comments: Mild apical thinning. No ischemia.   Appended Document: Cardiology Nuclear Testing Negative nuclear study.  OK to resume exercise.    Appended Document: Cardiology Nuclear Testing pt aware of results

## 2010-07-30 NOTE — Miscellaneous (Signed)
Summary: nuc study l/l orders  Clinical Lists Changes  Problems: Added new problem of ABNORMAL STRESS ELECTROCARDIOGRAM (ICD-794.31) Orders: Added new Referral order of Nuclear Stress Test (Nuc Stress Test) - Signed Observations: Added new observation of PI CARDIO: Your physician recommends that you schedule a follow-up appointment after stress test Your physician recommends that you return for a FASTING lipid and liver profile: on the same day as your stress test Your physician recommends that you continue on your current medications as directed. Please refer to the Current Medication list given to you today. Your physician has requested that you have an exercise stress myoview.  For further information please visit [email or web address]  Please follow instruction sheet, as given. (03/26/2010 15:45)      Patient Instructions: 1)  Your physician recommends that you schedule a follow-up appointment after stress test 2)  Your physician recommends that you return for a FASTING lipid and liver profile: on the same day as your stress test 3)  Your physician recommends that you continue on your current medications as directed. Please refer to the Current Medication list given to you today. 4)  Your physician has requested that you have an exercise stress myoview.  For further information please visit [email or web address]  Please follow instruction sheet, as given.

## 2010-07-30 NOTE — Letter (Signed)
Summary: stone followup---[facility]   [facility]   Imported By: Lanelle Bal 01/27/2010 12:39:06  _____________________________________________________________________  External Attachment:    Type:   Image     Comment:   External Document  Appended Document: Orders Update  downalod  7/1 - 03/25/10 >> good compliance, avg pr 7.4 cm, AHI 1.5 cm, no leak, change to fixed pr 8 cm Let him know we have sent Rx to change settings to fixed pressure.   Clinical Lists Changes  Orders: Added new Referral order of DME Referral (DME) - Signed      Appended Document: sleep issues/jd pt informed and stated he will take machine to DME this afternoon. jwr

## 2010-07-30 NOTE — Progress Notes (Signed)
Summary: Refill Request  Phone Note Refill Request Call back at [phone] Message from:  Pharmacy on Nov 11, 2009 10:14 AM  Refills Requested: Medication #1:  GLIPIZIDE 10 MG TABLET, take 1 tablet by mouth twice a day   Dosage confirmed as above?Dosage Confirmed   Supply Requested: 1 month   Last Refilled: 07/01/2009 [facility] on [address]   Next Appointment Scheduled: 7.6.11 Initial call taken by: Harold Barban,  Nov 11, 2009 10:15 AM  Follow-up for Phone Call        SPOKE WITH PHARMACIST INFORMED THIS RX WAS REMOVED FROM  MED LIST .Kandice Hams  Nov 11, 2009 2:33 PM  Follow-up by: Kandice Hams,  Nov 11, 2009 2:33 PM
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
## 2010-07-30 NOTE — Letter (Signed)
Summary: Outpatient Coinsurance Notice  Outpatient Coinsurance Notice   Imported By: Marylou Mccoy 04/08/2010 12:06:07  _____________________________________________________________________  External Attachment:    Type:   Image     Comment:   External Document

## 2010-07-30 NOTE — Assessment & Plan Note (Signed)
Summary: sleep issues/Leonard [name]   Visit Type:  Initial Consult Primary Leonard [name]/Referring Leonard [name]:  [name], MD  CC:  Pt here to become re-established.  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  The sleep efficiency of 65%.  Although the oral AHI was 8 per hour, but REM related AHI was31.8.  The lowest desaturation was 87%.  Longest hypopnea was 43 seconds.  His lowest heart rate was 52 beats per minute during non-REM sleep.  No PLMs or arrhythmias were noted.  August15, 2011  Had good results with autoCPAP but lost to FU x 3 yrs. Feels more refreshed. He has lost from 295 to 258 lbs  Preventive Screening-Counseling & Management  Alcohol-Tobacco     Smoking Status: quit     Year Quit: 2005  Caffeine-Diet-Exercise     Does Patient Exercise: no   History of Present Illness: Sleep disorder-CPAP user Sleep Apnea  What time do you typically go to bed?(between what hours): 11:00pm  How long does it take you to fall asleep? 10 minutes  How many times during the night do you wake up? 2  What time do you get out of bed to start your day? 7:00am  Do you drive or operate heavy machinery in your occupation? no  How much has your weight changed (up or down) over the past two years? (in pounds): 45 decrease   Do you currently use CPAP ? If so , at what pressure? yes-18  Do you wear oxygen at any time? If yes, how many liters per minute? no Current Medications (verified): 1)  Amlodipine Besy-Benazepril Hcl 10-20 Mg Caps (Amlodipine Besy-Benazepril Hcl) .[name].. 1 By Mouth Qpm 2)  Benazepril Hcl 20 Mg Tabs (Benazepril Hcl) .... One By Mouth Every Am 3)  Hydrochlorothiazide 25 Mg Tabs (Hydrochlorothiazide) .... Take 1 Tablet By Mouth Every Morning 4)  Simvastatin 40 Mg Tabs (Simvastatin) .... Take 1 Tablet  By Mouth At Bedtime 5)  Janumet 50-500 Mg  Tabs (Sitagliptin-Metformin Hcl) .... Take 1 Tablet By Mouth Two Times A Day 6)  Glipizide 10 Mg Tabs (Glipizide) .... 1/2 Once Daily. 7)  Flomax 0.4 Mg  Cp24 (Tamsulosin Hcl) .[name].. 1 By Mouth At Bedtime 8)  K Citrate .... Two Times A Day 9)  Allopurinol 300 Mg  Tabs (Allopurinol) .... Take 1 Tablet By Mouth Once A Day 10)  Fish Oil 1000 Mg  Caps (Omega-3 Fatty Acids) .[name].. 1 Two Times A Day 11)  Vitamin D 1000 Unit  Tabs (Cholecalciferol) .... Once Daily 12)  Accu-Chek Compact   Strp (Glucose Blood) .... Check Blood Sugar Daily 13)  Multivitamins   Tabs (Multiple Vitamin) .... Qd 14)  Vitamin E 1000 Unit  Caps (Vitamin E) .... Once Daily 15)  Clonazepam 0.5 Mg Tbdp (Clonazepam) .... One or Two At Bedtime As Needed For Anxiety or Insomnia 16)  Diclofenac Potassium 50 Mg Tabs (Diclofenac Potassium) .... 2x As Needed 17)  Cpap .[name].. 18 Max Ahc  Allergies (verified): No Known Drug Allergies  Past History:  Past Medical History: Last updated: 12/31/2009 Diabetes mellitus, type II Hyperlipidemia Hypertension Osteoarthritis-knees, recommended replaement by Dr. 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  The patient denies shortness of breath with activity, shortness of breath at rest, productive cough, non-productive cough, coughing up blood, chest pain, irregular heartbeats, acid heartburn, indigestion, loss of appetite, weight change, abdominal pain, difficulty swallowing, sore throat, tooth/dental problems, headaches, nasal congestion/difficulty  breathing through nose, sneezing, itching, ear ache, hand/feet swelling, joint stiffness or pain, rash, change in color of mucus, and fever.    Vital Signs:  Patient profile:   55 year old male Height:      72 inches Weight:      258.13 pounds BMI:     35.14 O2 Sat:      98 % on Room air Temp:     98.3 degrees F oral Pulse rate:   75 / minute BP sitting:   110 / 78  (left arm) Cuff size:   large  Vitals Entered By: Zackery Barefoot CMA (February 09, 2010 3:08 PM)  O2 Flow:  Room air CC: Pt here to become re-established Comments Medications reviewed with patient Verified contact number and pharmacy with patient Zackery Barefoot CMA  February 09, 2010 3:09 PM    Physical Exam  Additional Exam:  Gen. Pleasant, well-nourished, in no distress, normal affect ENT - no lesions, no post nasal drip, class 2 airway Neck: No JVD, no thyromegaly, no carotid bruits Lungs: no use of accessory muscles, no dullness to percussion, clear without rales or rhonchi  Cardiovascular: Rhythm regular, heart sounds  normal, no murmurs or gallops, no peripheral edema Abdomen: soft and non-tender, no hepatosplenomegaly, BS normal. Musculoskeletal: No deformities, no cyanosis or clubbing Neuro:  alert, non focal     Impression & Recommendations:  Problem # 1:  OBSTRUCTIVE SLEEP APNEA (ICD-327.23) The pathophysiology of obstructive sleep apnea, it's cardiovascular consequences and modes of treatment including CPAP were discussed with the patient in great detail.  ct autoCPAP, obtain download & adjust to fixed level CPAP as needed Compliance encouraged, wt loss emphasized, asked to avoid meds with sedative side effects, cautioned against driving when sleepy.  if he loses suficicent wt, revisit degree of obstructive sleep apnea  Orders: DME Referral (DME) Consultation Level III (62130)  Medications Added to Medication List This Visit: 1)  Cpap  .[name].. 18 max ahc  Patient Instructions: 1)  Copy sent to:  Dr Drue Novel 2)  Please schedule a follow-up appointment in 1 year. 3)  We will give you feedback once we see a download  Appended Document: Orders Update  downalod  7/1 - 03/25/10 >> good compliance, avg pr 7.4 cm, AHI 1.5 cm, no leak, change to fixed pr 8 cm Let him know we have sent Rx to change settings to fixed pressure.   Clinical Lists Changes  Orders: Added new Referral order of DME Referral (DME) - Signed      Appended Document: sleep issues/Leonard [name] pt informed and stated he will take machine to DME this afternoon. jwr  Appended Document: sleep issues/Leonard [name] download 10/28 - 06/03/10 >> good compliance, good control of events <2/h, minimal leak  Appended Document: sleep issues/Leonard [name] Pt informed no changes at this time. Pt c/o snoring with mask after adjusting Ramping. Was advised by Mardelle Matte that if continues will make arrangement to restrain mouth.
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
## 2010-07-30 NOTE — Progress Notes (Signed)
Summary: Nuclear Pre-Procedure  Phone Note Outgoing Call   Call placed by: Milana Na, EMT-P,  March 30, 2010 3:33 PM Summary of Call: Left message with information on [employer] Information Sheet (see scanned document for details).      Nuclear Med Background Indications for Stress Test: Evaluation for Ischemia   History: GXT, Myocardial Perfusion Study  History Comments: 05/08 MPS (-) ischemia 56% Thinning inferior     Nuclear Pre-Procedure Cardiac Risk Factors: Hypertension, Lipids, NIDDM Height (in): 72  Nuclear Med Study Referring MD:  J.Hochrein

## 2010-08-05 ENCOUNTER — Other Ambulatory Visit: Payer: Self-pay | Admitting: Internal Medicine

## 2010-08-05 ENCOUNTER — Encounter: Payer: Self-pay | Admitting: Internal Medicine

## 2010-08-05 ENCOUNTER — Encounter (INDEPENDENT_AMBULATORY_CARE_PROVIDER_SITE_OTHER): Payer: BC Managed Care – PPO | Admitting: Internal Medicine

## 2010-08-05 DIAGNOSIS — Z0000 Encounter for general adult medical examination without abnormal findings: Secondary | ICD-10-CM

## 2010-08-05 DIAGNOSIS — E119 Type 2 diabetes mellitus without complications: Secondary | ICD-10-CM

## 2010-08-05 DIAGNOSIS — I10 Essential (primary) hypertension: Secondary | ICD-10-CM

## 2010-08-05 DIAGNOSIS — M1990 Unspecified osteoarthritis, unspecified site: Secondary | ICD-10-CM

## 2010-08-05 DIAGNOSIS — E785 Hyperlipidemia, unspecified: Secondary | ICD-10-CM

## 2010-08-05 LAB — CBC WITH DIFFERENTIAL/PLATELET
Basophils Absolute: 0 10*3/uL (ref 0.0–0.1)
Eosinophils Absolute: 0.2 10*3/uL (ref 0.0–0.7)
HCT: 41.9 % (ref 39.0–52.0)
Hemoglobin: 14.4 g/dL (ref 13.0–17.0)
Lymphs Abs: 1.9 10*3/uL (ref 0.7–4.0)
MCHC: 34.3 g/dL (ref 30.0–36.0)
Monocytes Relative: 6.9 % (ref 3.0–12.0)
Neutro Abs: 5.7 10*3/uL (ref 1.4–7.7)
Platelets: 275 10*3/uL (ref 150.0–400.0)
RDW: 13 % (ref 11.5–14.6)

## 2010-08-05 LAB — BASIC METABOLIC PANEL
BUN: 27 mg/dL — ABNORMAL HIGH (ref 6–23)
CO2: 28 mEq/L (ref 19–32)
Calcium: 9.5 mg/dL (ref 8.4–10.5)
GFR: 72.48 mL/min (ref >60.00)
Glucose, Bld: 167 mg/dL — ABNORMAL HIGH (ref 70–99)
Potassium: 4.1 mEq/L (ref 3.5–5.1)
Sodium: 141 mEq/L (ref 135–145)

## 2010-08-05 LAB — HEMOGLOBIN A1C: Hgb A1c MFr Bld: 6.7 % — ABNORMAL HIGH (ref 4.6–6.5)

## 2010-08-05 NOTE — Progress Notes (Signed)
Summary: CPX--sched = 2/8  Phone Note Outgoing Call   Call placed by: Army Fossa CMA,  July 24, 2010 1:06 PM Summary of Call: Pt is due for a CPX. Army Fossa CMA  July 24, 2010 1:06 PM   Follow-up for Phone Call        patient has appointment for physical on for 08/05/2010 at 1:30pm Follow-up by: Jerolyn Shin,  July 29, 2010 4:11 PM
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
## 2010-09-16 ENCOUNTER — Other Ambulatory Visit: Payer: Self-pay | Admitting: Internal Medicine

## 2010-10-22 ENCOUNTER — Other Ambulatory Visit: Payer: Self-pay | Admitting: Internal Medicine

## 2010-10-23 NOTE — Telephone Encounter (Signed)
60, 3 RF 

## 2010-10-24 ENCOUNTER — Other Ambulatory Visit: Payer: Self-pay | Admitting: Cardiology

## 2010-10-24 DIAGNOSIS — I10 Essential (primary) hypertension: Secondary | ICD-10-CM

## 2010-10-27 ENCOUNTER — Other Ambulatory Visit: Payer: Self-pay | Admitting: Internal Medicine

## 2010-10-27 NOTE — Telephone Encounter (Signed)
Okay for a year 

## 2010-11-10 NOTE — Op Note (Signed)
NAMEROBEN, SCHLIEP NO.:  [id number]   MEDICAL RECORD NO.:  [id number]          PATIENT TYPE:  OBV   LOCATION:  1418                         FACILITY:  [facility]   PHYSICIAN:  Jamison Neighbor, M.D.  DATE OF BIRTH:  1955/12/28   DATE OF PROCEDURE:  12/09/2006  DATE OF DISCHARGE:                               OPERATIVE REPORT   PREOPERATIVE DIAGNOSIS:  1. Left ureteral calculus with hydronephrosis.  2. Moderate renal insufficiency exacerbated by hydronephrosis.   POSTOPERATIVE DIAGNOSIS:  1. Left ureteral calculus with hydronephrosis.  2. Moderate renal insufficiency exacerbated by hydronephrosis.   PROCEDURE:  Cystoscopy, bilateral retrograde, and a left double-J  catheter insertion.   SURGEON:  Jamison Neighbor, M.D.   ANESTHESIA:  General.   COMPLICATIONS:  None.   DRAINS:  6-French x 26 cm double J catheter   BRIEF HISTORY:  This 55 year old male has a past history of kidney  stones.  He presented to the hospital last night where he was found to  have some degree of renal insufficiency with a rise in his creatinine up  to 2.3.  One of potential reasons for this is the patient was found to  have a stone at the L5-S1 level in the distal left ureter with  associated hydronephrosis.  The patient does, however, describe recent  problems with diarrhea secondary to the initiation of therapy with  metformin. He also states that he had been out in the sun working and  may have become tired in that fashion.  The patient had his creatinine  rechecked today and it had come down to 1.6 which was closed to his  baseline of 1.4.  He is concerned, however, that he does not wish to  have pain on the left hand side but would like to go ahead and have  eventual ESWL. After some discussion, we decided that the best option  was to place a double-J catheter today, let him go home, and plan for  lithotripsy next week if, of course, the stone has not broken or passed,  he can  have in situ laser lithotripsy, but he would like to try the less  invasive therapy first. He understands the risks and benefits of the  procedure and gave full informed consent.   DESCRIPTION OF PROCEDURE:  After successful induction of general  anesthesia, the patient was placed in the dorsal lithotomy position,  prepped with Betadine, and draped in the usual sterile fashion.  Cystoscopy was performed, the urethra was visualized in its entirety and  found to be normal. Beyond the verumontanum, there was minimal prostatic  enlargement and no evidence of bladder obstruction.  The bladder was  carefully inspected and tumors or stones could be seen.  There was no  trabeculation and no irregularities of the mucosa. Bilateral retrograde  studies were performed. On the right hand side, a 6-French ureteral  catheter was inserted through the opening and a retrograde study was  done under visualization with fluoroscopy.  The ureter had a normal  caliber, the contrast ascended up the ureter, and the collecting system  was free of any filling  defects or other irregularities.  The drain out  on that side was normal. On the left hand side, a 6-French ureteral  catheter was also inserted into the ureter.  The contrast was injected  and a stone could be seen at the junction of the lumbar and sacral  vertebra.  There was hydronephrosis above that level with some clubbing  of the collecting system.  No other filling defects could be identified.  A drain out film showed that there was obstruction at the level of the  stone.   After the completion of the bilateral diagnostic retrogrades, a  guidewire was passed up to the kidney where it coil normally within the  pelvis.  A 6-French x 26 cm double-J catheter was passed over the wire  allowed to coil normally within the collecting system as well as within  the bladder.  The bladder was drained and the patient tolerated the  procedure well and was taken to  the recovery room in good condition.  He  will be sent home with Flomax to be taken one daily to try to help with  stone passage. He will not Lorcet 10 to take as needed for pain and  Pyridium Plus.  We will try to schedule him for ESWL next week.           ______________________________  Jamison Neighbor, M.D.  Electronically Signed     RJE/MEDQ  D:  12/09/2006  T:  12/10/2006  Job:  [id number]

## 2010-11-10 NOTE — Op Note (Signed)
NAMERAMAJ, FRANGOS NO.:  [id number]   MEDICAL RECORD NO.:  [id number]          PATIENT TYPE:  OBV   LOCATION:  3315                         FACILITY:  [facility]   PHYSICIAN:  Kristine Garbe. Ezzard Standing, M.D.DATE OF BIRTH:  [birth year]   DATE OF PROCEDURE:  06/15/2007  DATE OF DISCHARGE:                               OPERATIVE REPORT   PREOP DIAGNOSIS:  1. Chronic nasal obstruction with sinonasal polyps.  2. Septal deformity and turbinate hypertrophy.   POSTOPERATIVE DIAGNOSES:  1. Chronic nasal obstruction with sinonasal polyps.  2. Septal deformity and turbinate hypertrophy.   OPERATION PERFORMED:  1. Septoplasty with bilateral inferior turbinate reductions.  2. Functional endoscopic sinus surgery with bilateral total      ethmoidectomies.  3. Bilateral maxillary ostial enlargement with removal of polypoid      disease.   SURGEON:  Kristine Garbe. Ezzard Standing, M.D.   ANESTHETIC:  General endotracheal.   COMPLICATIONS:  None.   BRIEF CLINICAL NOTE:  Leonard [name] is a 55 year old gentleman with  obstructive sleep apnea, and chronic nasal obstruction.  He has had  chronic trouble breathing through his nose, despite use of nasal steroid  sprays.  He does have some allergies, and has also tried antihistamine  decongestant medication.  He has obstructive sleep apnea and is using  nasal CPAP and having some difficulty because of the nasal obstruction.  He has a septal deformity to the right with a narrow right nasal  passageway compared to left.  He has small polypoid disease within the  middle meatus bilaterally.  He is taken to the operating room at this  time for septoplasty, turbinate reductions, and functional endoscopic  sinus surgery with removal of sinonasal polyps.   DESCRIPTION OF PROCEDURE:  After adequate endotracheal anesthesia.  The  patient 1 gram Ancef IV preoperatively.  Nose was prepped with cotton  pledgets soaked in decongestant, and then  injected with Xylocaine with  epinephrine for hemostasis.  The patient had a deviation of the septum  to the right with a very narrowed right nasal passageway. and generous  sized inferior turbinates.   First a septoplasty was performed, and a hemitransfixion incision was  made along the septum at the caudal edge.  Mucoperichondrial  mucoperiosteal flaps were elevated posteriorly.  Most of the deformity  of the septum was at the junction of the bony cartilaginous septum.  This thickened cartilage and bone were removed with biting cutting  forceps.  The bony septum deviated to the right.  The septum returned  much more to midline, and I was able to better visualize the work in the  right middle meatus region.   Next the right middle meatus was injected with Xylocaine with  epinephrine.  The uncinate process was incised, anterior, and a few  posterior ethmoid cells were opened up with straight through cup and  microdebrider.  There was some polypoid disease within the ethmoid and  around the maxillary ostia, and this was removed.  The ostia was  enlarged to approximately 1 to 1.5 cm in size.  There was some minimal  disease within the maxillary sinus, but had  several polyps around the  maxillary ostia and the ethmoid region.  There was minimal bleeding on  the right side.  This completed the right ethmoidectomy and right  maxillary ostial enlargement.   Next, the left side was approached.  The left side had more polypoid  disease within the anterior superior ethmoid area toward the nasal  frontal region.  This area was opened up.  The polypoid tissue was  removed, as was the polypoid tissue around the left maxillary ostia.  There were a few polyps in the posterior ethmoid area on the left side.  A microdebrider and straight grasping and up through-cup forceps were  used to open this region up.  He had a moderate amount of bleeding from  the anterior ethmoid area on the left side.  A  cotton pledget soaked in  Afrin was placed for hemostasis.   Next, inferior turbinate reductions were performed.  On the left side,  incision was made along the inferior turbinate.  The mucosa was elevated  off the turbinate bone, and the turbinate bone was removed submucosally;  in addition, some the excess mucosa inferiorly was removed with a  microdebrider.  Cautery was used for hemostasis.  On the right side the  inferior 1/2 to 1/3 of the turbinate was amputated, and suction cautery  was used for hemostasis.  The remaining turbinate tissue was  outfractured bilaterally.  The hemitransfixion incision was closed with  interrupted 4-0 chromic sutures.  Septum was basted with a 3-0 chromic  suture.  Kennedy sinus packs were then placed within the ethmoid areas  bilaterally, and Telfa soaked in bacitracin ointment was placed along  the floor of the nose bilaterally.  This completed the procedure.  Eligha was awoke from anesthesia, and transferred to the recovery room,  postop doing well.   DISPOSITION:  Macalister will be observed for overnight observation because  of obstructive sleep apnea.  Will receive antibiotic Ancef  perioperatively, and plan discharge home on Keflex.  Will plan on  removing the nasal packs tomorrow morning, and discharging home after  the packs are removed.           ______________________________  Kristine Garbe Ezzard Standing, M.D.     CEN/MEDQ  D:  06/15/2007  T:  06/15/2007  Job:  [id number]   cc:   Valetta Mole. Cato Mulligan, MD  Oretha Milch, MD

## 2010-11-10 NOTE — Letter (Signed)
February 10, 2007    Bruce H. Swords, MD  [address] [place], [place] [address]   RE:  Leonard [name], Leonard [name]  MRN:  [id number]  /  DOB:  Aug 23, 1955   Dear Dr. Cato Mulligan:   Thank you for this referral.  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  Leonard [name] wife is a Administrator, Civil Service, and has  witnessed him choking and gasping in Leonard [name] sleep, and described loud  snoring.  Leonard [name] has occasionally been woken up by Leonard [name] own snoring.  Leonard [name]  reports Leonard [name] sleepiness score as 9 to 11 over 24, and describes  sleepiness.  Leonard [name] activity level is low, especially in the afternoon  hours.  Leonard [name] not able to drive for long distances without stopping at  rest areas.  Leonard [name] also reports significant nocturia, although Flomax has  helped a little bit.   Leonard [name] usual bedtime is between 10:30 p.m. to 11:30 p.m.  Sleep latency is  about 15 to 30 minutes.  Leonard [name] generally sleeps on Leonard [name] left side.  Leonard [name]  awakens 3 to 4 times for bathroom visits, and denies any postvoid sleep  latency.  Leonard [name] gets out of bed around 8 a.m. feeling tired with a dry  mouth and occasional headache.  On weekends, Leonard [name] will stay bed up to 10  a.m., and still be un-refreshed.  A cup of coffee generally gets him  going in the mornings, and Leonard [name] avoids sodas during the day.  Leonard [name] has  gained about 10 pounds in the last 3 years.   PAST MEDICAL HISTORY:  Includes:  1. Hypertension.  2. Diabetes type 2.  3. BPH.  4. Renal calculi requiring lithotripsy.   PAST SURGICAL HISTORY:  Knee surgery in both legs at 55 years of age.  Leonard [name] is trying to put off knee replacement surgery.   ALLERGIES:  NONE.   CURRENT MEDICATIONS:  Include:  1. Spironolactone 25 mg daily.  2. Lotrel 10/20 mg daily.  3. Benicar 40 mg daily.  4. Hydrochlorothiazide 25 mg daily.  5. Potassium citrate ER 10 mg b.i.d.  6. Glipizide 10 mg daily.  7. Diclofenac 75 mg b.i.d.  8. Metformin 400 mg  b.i.d.  9. Flomax 0.4 mg daily.  10.Simvastatin 40 mg daily.   SOCIAL HISTORY:  Leonard [name] quit smoking 2 years ago.  Smoked about a pack per  day for 25 years.  Leonard [name] is married, and teaches at [employer].   FAMILY HISTORY:  Leonard [name] son has asthma.  Father had prostate cancer.   REVIEW OF SYSTEMS:  Loud snoring.  Denies creepiness in legs before  going to sleep.  Denies sadness of mood.   PHYSICAL EXAMINATION:  Weight 275 pounds.  Temperature 97.6.  Blood  pressure 140/90.  Heart rate 90 per minute.  Oxygen saturation 96% on  room air.  HEENT:  Narrow oropharyngeal space.  NECK:  Supple.  __________  CARDIOVASCULAR:  Normal.  CHEST:  Clear to auscultation.  ABDOMEN:  Soft and nontender.  NEUROLOGIC:  Nonfocal.  EXTREMITIES:  No edema.   Overnight polysomnogram on January 04, 2007 recorded 232 minutes of sleep  with only 24.5 minutes of REM sleep.  The sleep maintained insufficiency  of 65%.  Although the oral AHI was 8 per hour, but REM related AHI was  31.8.  The lowest desaturation was 87%.  Longest hypopnea was 43  seconds.  Leonard [name] lowest  heart rate was 52 beats per minute during non-REM  sleep.  No PLMs or arrhythmias were noted.   IMPRESSION:  1. Obstructive sleep apnea.  I feel that the overnight polysomnogram      significantly underestimates Leonard [name] degree of sleep apnea.  Note, that      only 24.5 minutes of REM sleep was recorded, and during this      period, Leonard [name] had 13 events.  Unfortunately, this sleep study was      terminated at 5:30 a.m., and with Leonard [name] usual wake up time being 8      a.m., this would be the period of most extensive REM sleep.  I feel      that Leonard [name] would have significant REM related apnea.  Please also note      that our devices did not measure RERAs  (respiratory  effort-      related arousals), and hence, do underestimate the degree of sleep      apnea.  2. Obesity.  3. Difficult-to-control hypertension.  4. Diabetes type 2.   RECOMMENDATIONS:  1. Weight loss  should be encouraged.  Leonard [name] seems very motivated, and has      already started on a nutrition and exercise program.  2. The pathophysiology of obstructive sleep apnea and its      cardiovascular consequences and modes of treatment, including CPAP,      were discussed in detail.  Options including surgery and auto      appliance were also discussed.  I think Leonard [name] would definitely benefit      symptomatically from CPAP treatment.  This would perhaps also help      Leonard [name] blood pressure.  3. Due to Leonard [name] claustrophobia we would use nasal pillows as interface.      I will set him up with an auto CPAP with nasal pillows.  I have      discussed common CPAP problems.  Leonard [name] will use this for 2 weeks.  We      will download data, and put him on fixed CPAP based on the data      obtained.  Leonard [name] will call me for problems with the CPAP.  We will      keep you informed as to Leonard [name] progress.    Sincerely,      Oretha Milch, MD  Electronically Signed    RVA/MedQ  DD: 02/10/2007  DT: 02/11/2007  Job #: [id number]

## 2010-11-10 NOTE — Procedures (Signed)
NAMEKIERAN, ARREGUIN NO.:  [id number]   MEDICAL RECORD NO.:  [id number]          PATIENT TYPE:  OUT   LOCATION:  SLEEP CENTER                 FACILITY[facility]  [facility]   PHYSICIAN:  Barbaraann Share, MD,FCCPDATE OF BIRTH:  07-09-1955   DATE OF STUDY:  01/04/2007                            NOCTURNAL POLYSOMNOGRAM   REFERRING PHYSICIAN:   REFERRING PHYSICIAN:  Valetta Mole. Swords, M.D.   INDICATIONS FOR STUDY:  Hypersomnia with sleep apnea.   EPWORTH'S SCORE:  12.   SLEEP ARCHITECTURE:  The patient had a total sleep time of 233 minutes  with no slow wave sleep and only 25 minutes of REM.  Sleep onset latency  was prolonged at 67 minutes, and REM onset was normal.  Sleep efficiency  was severely decreased at 55%.   RESPIRATORY DATA:  The patient was found to have 30 obstructive  hypopneas and 1 obstructive apnea for an apnea/hypopnea index of 8  events per hour.  In addition the patient was noted to have numerous  respiratory effort-related arousals.  The obstructive events were  clearly worse in the supine position and there was moderate snoring  noted throughout.   OXYGEN DATA:  The patient had O2 desaturation as low as 87% with his  obstructive events.   CARDIAC DATA:  No clinically significant cardiac arrhythmias were noted.   MOVEMENT/PARASOMNIA:  None.   IMPRESSION/RECOMMENDATIONS:  Mild obstructive sleep apnea/hypopnea  syndrome with an apnea/hypopnea index of 8 events per hour and O2  desaturation as low as 87%.  It should be noted that the patient did  have many respiratory effort-related arousals and also had very little  slow wave sleep and REM which may underestimate his degree of sleep  apnea.  Treatment for this degree of sleep apnea can include weight loss  alone if  applicable, upper airway surgery, oral appliance, and also CPAP.  Treatment decisions should be based on how this is effecting the  patient's quality of life and what may fit best with his  lifestyle.      Barbaraann Share, MD,FCCP  Diplomate, American Board of Sleep  Medicine  Electronically Signed     KMC/MEDQ  D:  01/17/2007 17:01:01  T:  01/18/2007 10:13:22  Job:  [id number]

## 2010-11-10 NOTE — Assessment & Plan Note (Signed)
[facility]                            CARDIOLOGY OFFICE NOTE   MAURICE, FOTHERINGHAM                 MRN:          [id number]  DATE:01/03/2007                            DOB:          02/21/1956    PRIMARY:  Dr. Birdie Sons.   REASON FOR PRESENTATION:  Evaluate the patient with difficult-to-control  hypertension.   HISTORY OF PRESENT ILLNESS:  The patient returns for followup.  He is 55  years old.  He has done well since I last saw him.  He has had problems  with kidney stones and lots of discomfort related to the treatment of  this.  He was doing some walking before then and is now about to get  back to that.  He has noted improved blood pressure taking  spironolactone.  He has had followup blood work.  He did have an  elevation in his creatinine, but this was related to the kidney stone.  He is due to have this followed up soon.  He had no hyperkalemia.  He  has had no problems with gynecomastia or erectile dysfunction.  He  denies any chest pain or shortness of breath.  He has had no  palpitation, presyncope or syncope.  He has had no PND or orthopnea.   PAST MEDICAL HISTORY:  1. Hypertension for 14 years.  2. Hyperlipidemia for 6 months.  3. Diabetes mellitus for 2 years.  4. Nephrolithiasis, status post lithotripsy and extraction.  5. Degenerative joint disease.  6. Questionable fibromuscular disease of his renal artery on      arteriogram in 1996 with a normal renin level.   ALLERGIES:  NIACIN.   MEDICATIONS:  1. Spironolactone 25 mg daily.  2. Lotrel 10/20 daily.  3. Benicar 40 mg daily.  4. Hydrochlorothiazide 25 mg daily.  5. Citrate ER 10 mg b.i.d.  6. Glipizide 10 mg daily.  7. Diclofenac 75 mg b.i.d.  8. Metformin 500 mg b.i.d.  9. Flomax 0.4 mg daily.  10.Simvastatin 40 mg daily.   REVIEW OF SYSTEMS:  As stated in the HPI and otherwise negative for  other systems.   PHYSICAL EXAMINATION:  The patient is in no  distress  Blood pressure 124/90, heart rate 71 and regular, weight 276 pounds,  body mass index 37.  HEENT:  Eyelids unremarkable.  Pupils are equal, round and reactive to  light.  Fundi not visualized.  Oral mucosa unremarkable.  NECK:  No jugular venous distention at 45 degrees.  Carotid upstroke  brisk and symmetric, no bruits.  No thyromegaly.  LYMPHATICS:  No cervical, axillary or inguinal adenopathy.  LUNGS:  Clear to auscultation bilaterally.  BACK:  No costovertebral angle tenderness.  CHEST:  Unremarkable.  HEART:  PMI not displaced or sustained.  S1 and S2 within normal limits.  No S3, no S4.  No clicks, no rubs, no murmurs.  ABDOMEN:  Obese.  Positive bowel sounds, normal in frequency and pitch.  No bruits, no rebound, no guarding, no midline pulsatile mass, no  hepatomegaly, no splenomegaly.  SKIN:  No rashes, no nodules.  EXTREMITIES:  Pulses 2+ throughout.  No  edema, no cyanosis, no clubbing.  NEUROLOGIC:  Oriented to person, place and time.  Cranial nerves II-XII  grossly intact.  Motor grossly intact throughout.   EKG:  Sinus rhythm, rate 71, axis within normal limits, intervals within  normal limits, no acute ST-T wave changes, early transition in lead V2.   ASSESSMENT AND PLAN:  1. Hypertension:  Blood pressure is better controlled on the      spironolactone.  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  At      this point, I am going to try to back off on his medications      including reducing his Benicar to 20 mg daily.  He will follow up      with Dr. Cato Mulligan.  My goal would be to get rid of the Benicar.  I      then might consider discontinuing the hydrochlorothiazide.      Finally, I would get rid of the amlodipine component of the Lotrel      as he loses weight.  During that time if necessary we could      increase his spironolactone.  I would continue the ACE inhibitor.      Again, he will follow closely with Dr. Cato Mulligan and he is getting      blood work  done.  He knows he needs to get his blood work checked      about 3 times a year while on the spironolactone.  2. Obesity:  Again, he is working towards losing weight with diet and      exercise and he has an appointment with a nutritionist.  3. Followup:  I will see him back in 6 months and then probably as      needed thereafter.     Rollene Rotunda, MD, [facility]  Electronically Signed    JH/MedQ  DD: 01/03/2007  DT: 01/04/2007  Job #: [id number]   cc:   Valetta Mole. Swords, MD

## 2010-11-10 NOTE — Consult Note (Signed)
Leonard [name], Leonard [name] NO.:  [id number]   MEDICAL RECORD NO.:  [id number]          PATIENT TYPE:  EMS   LOCATION:  ED                           FACILITY:  [facility]   PHYSICIAN:  Heloise Purpura, MD      DATE OF BIRTH:  1955-08-27   DATE OF CONSULTATION:  12/08/2006  DATE OF DISCHARGE:                                 CONSULTATION   REASON FOR CONSULTATION:  1. Left ureteral calculus  2. Acute renal insufficiency.   REQUESTING PHYSICIAN:  Dr. Vanetta Mulders   HISTORY:  [name] is a 55 year old gentleman with a history of  nephrolithiasis who has been followed by Dr. Marcelyn Bruins.  He is seen  in consultation tonight due to his stone as well as his acute renal  insufficiency.  [name] has been somewhat dehydrated over the  last week.  He has been placed on metformin for diabetes beginning 1  week ago and did develop diarrhea at that time.  He began having gross  hematuria associated with left lower quadrant pain that was mild to  moderate earlier this morning.  He presented to the emergency  department, and a CT scan was performed which did demonstrate a 1 cm  left proximal ureteral calculus along with a smaller nonobstructing  lower pole left renal calculus.  There did not appear to be any stones  on the contralateral side.  The patient denies any recent fever.  He has  had some nausea and vomiting and poor p.o. intake.   PAST MEDICAL HISTORY:  1. Hypertension.  2. Hypertriglyceridemia.  3. Diabetes.  4. History of renal artery stenosis.   PAST SURGICAL HISTORY:  1. Bilateral knee surgery.  2. Repair of left femur fracture.  3. Renal artery angioplasty.  4. Lithotripsy.   MEDICATIONS:  1. Clonidine.  2. Hydrochlorothiazide.  3. Spironolactone.  4. Benicar.  5. Metformin.  6. Glipizide.  7. Zocor.  8. Diclofenac.  9. Potassium citrate.   ALLERGIES:  NIACIN.   FAMILY HISTORY:  No history of nephrolithiasis.   SOCIAL HISTORY:  The  patient quit smoking many years ago.   REVIEW OF SYSTEMS:  A complete review of systems was obtained..  Pertinent positives include recent nausea, vomiting, hematuria.  The  patient has also had significant diarrhea.  All other systems are  reviewed and are otherwise negative except for as in the history.   PHYSICAL EXAMINATION:  VITAL SIGNS:  Temperature 98.8, heart rate 76,  blood pressure 132/88.  CONSTITUTIONAL:  Alert and oriented, in no acute distress.  HEENT:  Normocephalic, atraumatic.  NECK:  Supple, without lymphadenopathy.  CARDIOVASCULAR:  Regular rate and rhythm.  LUNGS:  Clear bilaterally.  ABDOMEN:  Mild tenderness to palpation of the left lower quadrant.  No  rebound, tenderness, or guarding.  BACK:  No CVA tenderness.  GU:  Normal male external genitalia.  EXTREMITIES:  No edema.  PSYCHIATRIC:  Normal mood and affect.   LABORATORIES:  Urinalysis:  pH 5.50, nitrites negative, with 0-2 white  blood cells, too numerous to count red blood cells, and rare bacteria.  Serum electrolytes are mostly  within normal limits, except for the  patient's glucose which is 115.  Serum creatinine is 2.27, with a  calculated GFR of 31 mL/minute.  White blood count is 9.1, hemoglobin is  13.5. Serum creatinine September 13, 2006 is 1.2.   IMAGING:  The patient's CT scan was independently reviewed and does  represent a 9.8 mm proximal left ureteral calculus, with significant  hydronephrosis above the level stone.  The patient's right kidney  appears normal on noncontrasted images.  There is a small are  nonobstructing left lower pole renal calculus.   IMPRESSION:  1. Left ureteral calculus.  2. Acute renal failure.   PLAN:  Mr. Cho will be admitted for intravenous fluid hydration  and observation.  His renal function will be followed closely.  If his  creatinine does not improve, and certainly if it increases, he likely  will require ureteral stent drainage.  Otherwise, if the  patient's  creatinine is improving with hydration, he may be able to delay  definitive management of this stone per Dr. Logan Bores' recommendations.           ______________________________  Heloise Purpura, MD  Electronically Signed     LB/MEDQ  D:  12/08/2006  T:  12/09/2006  Job:  [id number]   cc:   Jamison Neighbor, M.D.  Fax: [phone]   Shelda Jakes, MD  [address] [place], [place] [address]

## 2010-11-13 NOTE — Procedures (Signed)
[facility]                              EXERCISE TREADMILL   [name], [name]                 MRN:          [id number]  DATE:10/06/2006                            DOB:          Nov 20, 1955    PROCEDURE:  Exercise treadmill test.   INDICATION:  Evaluate patient with chest discomfort and multiple  cardiovascular risk factors.   PROCEDURAL NOTE:  The patient was exercised using standard Bruce  protocol.  He exercised for 8 minutes.  The test was terminated because  of an accelerated blood pressure response, abnormal EKG, and because he  had reached his target heart rate.  He achieved 10.1 mets.  He had a  peak heart rate of 171, which was 100% of predicted.  His maximum blood  pressure was 202/74.  He had no chest pressure, neck, or arm discomfort.  However, he did have 1.5-mm ST segment depression in II, III, aVF, V5,  and V6 with peak exertion.  This resolved quickly in recovery.  He did  have slightly increased ventricular ectopy with recovery as well.  He  had a normal heart rate recovery.   CONCLUSION:  Abnormal stress perfusion study with ST segment changes.  These are not high risk as they occurred later in his activity, but they  must be interpreted as positive for evidence of an obstructive coronary  disease.   Based on the above, the patient will have stress perfusion imaging to  further clarify the presence or absence of obstructive coronary disease.     Rollene Rotunda, MD, [facility]  Electronically Signed    JH/MedQ  DD: 10/06/2006  DT: 10/06/2006  Job #: [id number]   cc:   L. Lupe Carney, M.D.

## 2010-11-13 NOTE — Assessment & Plan Note (Signed)
[facility]                            CARDIOLOGY OFFICE NOTE   Leonard [name], Leonard [name]                 MRN:          [id number]  DATE:09/13/2006                            DOB:          [birth year]    PRIMARY CARE PHYSICIAN:  L. Lupe Carney, M.D.   REASON FOR PRESENTATION:  Evaluate patient with difficult-to-control  hypertension.   HISTORY OF PRESENT ILLNESS:  The patient is a very pleasant 55 year old  gentleman whose father and mother are patients of mine.  He has had  difficult-to-control hypertension for over 14 years.  He has had a work-  up that apparently included a renal angiogram which is not available at  this point.  He did have some renal insufficiency I do note on labs in  March with a creatinine of 2; however, he says at that time he had  stones and underwent lithotripsy, and he says his creatinine returned to  baseline.  He has been on a multiple combination of medicines and says  his current blood pressure is as good as it has been.  He denies any  flushing, presyncope or syncope.  He will have vagal symptoms with blood  draws or other such stimuli.   He has not been as active as he used to be, although in the last 7 days  he stated elliptical training.  He denies any chest discomfort, neck  discomfort, arm discomfort, activity-induced nausea or vomiting or  excessive diaphoresis.  He has had no PND or orthopnea.   PAST MEDICAL HISTORY:  1. Hypertension x14 years.  2. Hyperlipidemia x6 months.  3. Diabetes mellitus x2 years.  4. Nephrolithiasis, status post lithotripsy.  5. Degenerative joint disease.   ALLERGIES:  NIACIN.   MEDICATIONS:  1. Benicar 40 mg daily.  2. Clonidine 0.1 mg b.i.d.  3. Hydrochlorothiazide 25 mg daily.  4. Amlodipine and benazepril 10/20 daily.  5. Diclofenac 75 b.i.d.  6. Glipizide 10 mg daily.  7. Potassium citrate 10 mg b.i.d.  8. Lovaza b.i.d.   SOCIAL HISTORY:  The patient is a  professor at [employer].  He has 2 children,  ages 66 and 21.  He was smoking 1 pack a day for 20 years but quit in  2006.  He drinks alcohol socially.   FAMILY HISTORY:  Contributory for his father having valvular heart  disease.  Otherwise, there is no early coronary disease.   REVIEW OF SYSTEMS:  As stated in the history of present illness.  Negative for other systems.   PHYSICAL EXAMINATION:  GENERAL:  The patient is in no distress.  VITAL SIGNS:  Blood pressure 148/98, heart rate 67 and regular, weight  283 pounds, body mass index 38.  HEENT:  Eyes unremarkable.  Pupils equal, round and reactive to light.  Fundi within normal limits.  Oral mucosa unremarkable.  NECK:  No jugular venous distention at 45 degrees.  Carotid upstrokes  brisk and symmetric.  No bruits or thyromegaly.  LYMPHATICS:  No cervical, axillary or inguinal adenopathy.  LUNGS:  Clear to auscultation bilaterally.  BACK:  No costovertebral angle tenderness.  CHEST:  Unremarkable.  HEART:  PMI not displaced or sustained.  S1 and S2 within normal limits.  No S3, no S4, clicks, rubs or murmurs.  ABDOMEN:  Obese.  Positive bowel sounds.  Normal in frequency and pitch.  No bruits, rebound or guarding.  No midline pulse or mass.  No  hepatomegaly or splenomegaly.  SKIN:  No rashes, no nodules.  EXTREMITIES:  There were 2+ pulses throughout.  No edema, no cyanosis,  no clubbing.  NEUROLOGIC:  Oriented to person, place, and time.  Cranial nerves II-XII  grossly intact.  Motor grossly intact throughout.   ELECTROCARDIOGRAM:  Sinus rhythm, rate 67.  Axis within normal limits.  Intervals within normal limits.  No acute ST-T wave changes.   ASSESSMENT AND PLAN:  1. Hypertension.  The patient's blood pressure is very difficult to      control.  Apparently, he has had a work-up for secondary causes.  I      would like to see his renal angiogram.  Will get a release of      information, but this says it is not available.  I am  going to add      Spironolactone after checking a BMET today.  This is typically      effective in difficult-to-control hypertension.  I will start with      25 mg daily.  I discussed with him gynecomastia and impotence and      hyperkalemia.  He will keep a look at his blood pressure on this.      Will continue the other medications.  Also weight loss would      probably facilitate, as it has in the past, blood pressure control.  2. Risk stratification.  The patient has multiple cardiovascular risk      factors.  We are going to have him come back for an exercise      treadmill test.  I doubt that he has any obstructive coronary      disease, but I would like to screen for this, and more importantly      risk stratify him, as well as give him a prescription for exercise.  3. Obesity.  Discussed weight loss with diet and exercise.  He did      this with [facility]kin's diet in the      past, but I would suggest [facility].  4. Follow up.  I will see him at the time of his treadmill test.     Rollene Rotunda, MD, [facility]  Electronically Signed    JH/MedQ  DD: 09/13/2006  DT: 09/14/2006  Job #: [id number]   cc:   L. Lupe Carney, M.D.

## 2010-11-20 ENCOUNTER — Other Ambulatory Visit: Payer: Self-pay | Admitting: Internal Medicine

## 2010-11-26 ENCOUNTER — Other Ambulatory Visit: Payer: Self-pay | Admitting: Internal Medicine

## 2010-11-26 MED ORDER — GLUCOSE BLOOD VI STRP: ORAL_STRIP | Status: DC

## 2010-11-26 NOTE — Telephone Encounter (Signed)
Strips sent in.

## 2010-12-02 ENCOUNTER — Encounter: Payer: Self-pay | Admitting: Internal Medicine

## 2010-12-21 ENCOUNTER — Other Ambulatory Visit: Payer: Self-pay | Admitting: Internal Medicine

## 2010-12-21 ENCOUNTER — Telehealth: Payer: Self-pay | Admitting: Internal Medicine

## 2010-12-21 MED ORDER — ACCU-CHEK SOFT TOUCH LANCETS MISC: Status: DC

## 2010-12-21 MED ORDER — ACCU-CHEK SOFT TOUCH LANCETS MISC: Status: AC

## 2010-12-21 MED ORDER — GLUCOSE BLOOD VI STRP: ORAL_STRIP | Status: DC

## 2010-12-21 MED ORDER — ACCU-CHEK COMPACT PLUS CARE KIT: PACK | Status: DC

## 2010-12-21 NOTE — Telephone Encounter (Signed)
error 

## 2010-12-21 NOTE — Telephone Encounter (Signed)
Patient called back---said he gave [place] wrong pharmacy---says he needs prescription called into [facility] on [address], [place]

## 2010-12-21 NOTE — Telephone Encounter (Signed)
Addended by: Army Fossa R on: 12/21/2010 04:19 PM   Modules accepted: Orders

## 2010-12-21 NOTE — Telephone Encounter (Signed)
Sent in

## 2011-01-21 ENCOUNTER — Other Ambulatory Visit: Payer: Self-pay | Admitting: Internal Medicine

## 2011-01-22 NOTE — Telephone Encounter (Signed)
Ok 6 months supply

## 2011-01-28 ENCOUNTER — Ambulatory Visit: Payer: BC Managed Care – PPO | Admitting: Internal Medicine

## 2011-02-11 ENCOUNTER — Ambulatory Visit (INDEPENDENT_AMBULATORY_CARE_PROVIDER_SITE_OTHER): Payer: BC Managed Care – PPO | Admitting: Internal Medicine

## 2011-02-11 ENCOUNTER — Encounter: Payer: Self-pay | Admitting: Internal Medicine

## 2011-02-11 DIAGNOSIS — E119 Type 2 diabetes mellitus without complications: Secondary | ICD-10-CM

## 2011-02-11 DIAGNOSIS — M1990 Unspecified osteoarthritis, unspecified site: Secondary | ICD-10-CM

## 2011-02-11 DIAGNOSIS — E785 Hyperlipidemia, unspecified: Secondary | ICD-10-CM

## 2011-02-11 DIAGNOSIS — R7989 Other specified abnormal findings of blood chemistry: Secondary | ICD-10-CM

## 2011-02-11 DIAGNOSIS — I10 Essential (primary) hypertension: Secondary | ICD-10-CM

## 2011-02-11 LAB — HEPATIC FUNCTION PANEL
ALT: 27 U/L (ref 0–53)
Albumin: 5 g/dL (ref 3.5–5.2)
Total Protein: 8.1 g/dL (ref 6.0–8.3)
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
## 2011-02-11 NOTE — Assessment & Plan Note (Signed)
LFTs elevate AST 203, AL:T 105 last month at urology. Since then he has d/c  tylenol (650 mg qd), tramadol, runed out of many OTCs No ETOH at all, on allopurinol x years .  Plan: recheck, further labs if still elevated

## 2011-02-11 NOTE — Assessment & Plan Note (Addendum)
amb BP slt elevated but has not checked recently; his life style has improved and BP today is great. Suspect his control is appropriate BMP last month normal Plan:  no change  (His pharmacyst states there is a amlodip-benazepril 10-40 combo, not found in Epic-- written Rx provided )

## 2011-02-11 NOTE — Assessment & Plan Note (Signed)
Well controlled 

## 2011-02-11 NOTE — Assessment & Plan Note (Addendum)
Doing great with life style, on glipizide as needed only Labs

## 2011-02-11 NOTE — Assessment & Plan Note (Signed)
Self d/c tramadol -tylenol due to increased LFTs last month Will see how he does w/o meds

## 2011-02-23 ENCOUNTER — Other Ambulatory Visit: Payer: Self-pay | Admitting: Internal Medicine

## 2011-02-23 ENCOUNTER — Other Ambulatory Visit: Payer: Self-pay | Admitting: Cardiology

## 2011-02-24 NOTE — Telephone Encounter (Signed)
Rx Done . 

## 2011-02-25 ENCOUNTER — Ambulatory Visit (INDEPENDENT_AMBULATORY_CARE_PROVIDER_SITE_OTHER): Payer: BC Managed Care – PPO | Admitting: Internal Medicine

## 2011-02-25 VITALS — BP 110/80 | HR 86

## 2011-02-25 DIAGNOSIS — R42 Dizziness and giddiness: Secondary | ICD-10-CM

## 2011-02-25 LAB — CBC WITH DIFFERENTIAL/PLATELET
Basophils Relative: 0.3 % (ref 0.0–3.0)
Eosinophils Relative: 1.9 % (ref 0.0–5.0)
Hemoglobin: 14.7 g/dL (ref 13.0–17.0)
Lymphocytes Relative: 22.7 % (ref 12.0–46.0)
MCHC: 33.7 g/dL (ref 30.0–36.0)
Monocytes Relative: 8.8 % (ref 3.0–12.0)
Neutro Abs: 5.6 10*3/uL (ref 1.4–7.7)
RBC: 4.86 Mil/uL (ref 4.22–5.81)

## 2011-02-25 LAB — BASIC METABOLIC PANEL
CO2: 27 mEq/L (ref 19–32)
Calcium: 9.6 mg/dL (ref 8.4–10.5)
GFR: 79.68 mL/min (ref >60.00)
Sodium: 140 mEq/L (ref 135–145)

## 2011-02-25 MED ORDER — MECLIZINE HCL 12.5 MG PO TABS: 12.5000 mg | ORAL_TABLET | Freq: Three times a day (TID) | ORAL | Status: AC | PRN

## 2011-02-25 NOTE — Patient Instructions (Signed)
ER if symptoms severe, headache, nausea , chest pain  Rest, lots of fluids

## 2011-02-25 NOTE — Assessment & Plan Note (Addendum)
55 year old gentleman with diabetes, high cholesterol hypertension with a one-week history of dizziness, fatigue. The dizziness is made worse by lying down or standing up, orthostatic vital signs are wnl , neurological exam is normal. EKG today wnl Sx likely peripheral , no h/o of low BP-CBGs Plan: Rest , fluids Labs antivert ER if sx severe

## 2011-02-25 NOTE — Progress Notes (Signed)
  Subjective:    Patient ID: Leonard [name], male    DOB: 1955-09-06, 55 y.o.   MRN: [id number]  HPI One week history of not feeling well in the afternoon. He feels dizzy, fatigued and very hungry. The dizziness is not described as spinning or lack of balance, spacey?[name] He is better when he rests in bed, is worse whenever he stands up or lay down ( like when he laid down and the table to be examined); has not notice this feeling when he turns in bed. He has checked his blood pressure and blood sugar while he is symptomatic and they are  abnormal. There is no associated nausea or headache.  Past Medical History  Diagnosis Date  . DM (diabetes mellitus)   . Other and unspecified hyperlipidemia   . HTN (hypertension)   . Osteoarthritis   . Nephrolithiasis   . Anxiety   . Depression   . OSA on CPAP   . Normal nuclear stress test    Past Surgical History  Procedure Date  . Knee surgery     reconstruction  . Femur fracture surgery   . Lithotripsy       Review of Systems Denies any chest pains or palpitations No URI type of sx No near-syncope, double vision, focal deficits, face paresthesias, slurred speech. On average his blood pressure is 120/70-130/80. No low blood sugars during the episodes or throughout the day. did 1 hour of intense workup at the gym yesterday w/o problems     Objective:   Physical Exam  Constitutional: He is oriented to person, place, and time. He appears well-developed and well-nourished. No distress.  Neck:       Normal carotid pulse  Cardiovascular: Normal rate, regular rhythm and normal heart sounds.   No murmur heard. Pulmonary/Chest: Breath sounds normal. No respiratory distress. He has no wheezes. He has no rales.  Musculoskeletal: He exhibits no edema.  Neurological: He is alert and oriented to person, place, and time. He has normal reflexes. He displays normal reflexes. No cranial nerve deficit. He exhibits normal muscle tone. Coordination  normal.       Speech, gait and motor normal  Skin: He is not diaphoretic.  Psychiatric: He has a normal mood and affect. His behavior is normal. Thought content normal.         Assessment & Plan:

## 2011-03-03 ENCOUNTER — Other Ambulatory Visit: Payer: Self-pay | Admitting: Internal Medicine

## 2011-03-04 ENCOUNTER — Encounter: Payer: Self-pay | Admitting: Pulmonary Disease

## 2011-03-04 ENCOUNTER — Ambulatory Visit (INDEPENDENT_AMBULATORY_CARE_PROVIDER_SITE_OTHER): Payer: BC Managed Care – PPO | Admitting: Pulmonary Disease

## 2011-03-04 DIAGNOSIS — G4733 Obstructive sleep apnea (adult) (pediatric): Secondary | ICD-10-CM

## 2011-03-04 MED ORDER — GLUCOSE BLOOD VI STRP: ORAL_STRIP | Status: DC

## 2011-03-04 NOTE — Patient Instructions (Signed)
We will check download on your machine & increase presure if needed to cut out snoring

## 2011-03-04 NOTE — Progress Notes (Signed)
  Subjective:    Patient ID: Leonard [name], male    DOB: Jul 05, 1955, 55 y.o.   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 The sleep efficiency of 65%. Although the oral AHI was 8 per hour, but REM related AHI was31.8. The lowest desaturation was 87%. Longest hypopnea was 43 seconds. His lowest heart rate was 52 beats per minute during non-REM sleep. No PLMs or arrhythmias were noted.  August15, 2011  Had good results with autoCPAP but lost to FU x 3 yrs. Feels more refreshed. He has lost from 295 to 258 lbs Changed to 8 cm based on download  03/04/2011 Last visit 8/11 pt states he snores a lot with cpap since the pressure was lowered, only when he is very tired otherwise it is fine, has been out camping a few times Download on 8 cm shows no residual events, AHI 1.1/h, leak +, good compliance Mask ok, pressure ok Wt 240 range   Review of Systems Patient denies significant dyspnea,cough, hemoptysis,  chest pain, palpitations, pedal edema, orthopnea, paroxysmal nocturnal dyspnea, lightheadedness, nausea, vomiting, abdominal or  leg pains      Objective:   Physical Exam Gen. Pleasant, well-nourished, in no distress ENT - no lesions, no post nasal drip Neck: No JVD, no thyromegaly, no carotid bruits Lungs: no use of accessory muscles, no dullness to percussion, clear without rales or rhonchi  Cardiovascular: Rhythm regular, heart sounds  normal, no murmurs or gallops, no peripheral edema Musculoskeletal: No deformities, no cyanosis or clubbing         Assessment & Plan:

## 2011-03-04 NOTE — Telephone Encounter (Signed)
Rx Done . 

## 2011-03-05 NOTE — Assessment & Plan Note (Signed)
Ct CPAP 8 cm He will adjust mask to prevent leak Weight loss encouraged - If he is able to stay at this wt, consider repeating study to see if he still needs CPAP  compliance with goal of at least 4-6 hrs every night is the expectation. Advised against medications with sedative side effects Cautioned against driving when sleepy - understanding that sleepiness will vary on a day to day basis

## 2011-03-24 ENCOUNTER — Other Ambulatory Visit: Payer: Self-pay | Admitting: Internal Medicine

## 2011-03-25 NOTE — Telephone Encounter (Signed)
Glipizide requested for Sig: Take [1] tab PO BID EMR Med List: Take 10 mg PO only as needed. Patient takes 1/4-1/2 tablet only PRN.  Please advise.

## 2011-04-02 LAB — CBC
HCT: 42.4
MCHC: 34
MCV: 88.4
Platelets: 312
RDW: 13
WBC: 7.5

## 2011-04-02 LAB — PROTIME-INR: Prothrombin Time: 13.2

## 2011-04-02 LAB — BASIC METABOLIC PANEL
BUN: 21
Chloride: 103
Creatinine, Ser: 1.23
Glucose, Bld: 188 — ABNORMAL HIGH

## 2011-04-15 LAB — URINALYSIS, ROUTINE W REFLEX MICROSCOPIC
Bilirubin Urine: NEGATIVE
Nitrite: NEGATIVE
Specific Gravity, Urine: 1.02
Urobilinogen, UA: 0.2

## 2011-04-15 LAB — URINE MICROSCOPIC-ADD ON

## 2011-04-15 LAB — BASIC METABOLIC PANEL
BUN: 35 — ABNORMAL HIGH
CO2: 21
Calcium: 9.2
Chloride: 103
Creatinine, Ser: 2.27 — ABNORMAL HIGH
GFR calc Af Amer: 37 — ABNORMAL LOW
GFR calc non Af Amer: 46 — ABNORMAL LOW
Glucose, Bld: 120 — ABNORMAL HIGH

## 2011-04-15 LAB — DIFFERENTIAL
Basophils Relative: 1
Eosinophils Absolute: 0.4
Eosinophils Relative: 4
Monocytes Absolute: 1 — ABNORMAL HIGH
Monocytes Relative: 11
Neutrophils Relative %: 48

## 2011-04-15 LAB — CBC
Hemoglobin: 13.5
MCHC: 34.6
MCV: 85.9
RBC: 4.52

## 2011-04-24 ENCOUNTER — Other Ambulatory Visit: Payer: Self-pay | Admitting: Cardiology

## 2011-05-24 ENCOUNTER — Other Ambulatory Visit: Payer: Self-pay

## 2011-05-24 NOTE — Telephone Encounter (Signed)
Last OV 02/25/2011. Last filled 03/30/2011, per Bill at [facility]

## 2011-05-31 MED ORDER — CLONAZEPAM 0.5 MG PO TABS: ORAL_TABLET | ORAL | Status: DC

## 2011-05-31 NOTE — Telephone Encounter (Signed)
Ok 60 and 1 RF 

## 2011-06-14 ENCOUNTER — Ambulatory Visit: Payer: BC Managed Care – PPO | Admitting: Internal Medicine

## 2011-06-15 ENCOUNTER — Ambulatory Visit: Payer: BC Managed Care – PPO | Admitting: Internal Medicine

## 2011-07-05 ENCOUNTER — Other Ambulatory Visit: Payer: Self-pay | Admitting: Internal Medicine

## 2011-07-05 MED ORDER — ATORVASTATIN CALCIUM 20 MG PO TABS: 20.0000 mg | ORAL_TABLET | Freq: Every day | ORAL | Status: DC

## 2011-07-05 NOTE — Telephone Encounter (Signed)
Rx sent 

## 2011-07-06 ENCOUNTER — Telehealth: Payer: Self-pay

## 2011-07-06 NOTE — Telephone Encounter (Signed)
Needing to verify pt's Lipitor rx.  Called pt to see if he wanted to change pharmacy to [facility].

## 2011-07-07 ENCOUNTER — Other Ambulatory Visit: Payer: Self-pay | Admitting: Internal Medicine

## 2011-07-07 MED ORDER — SITAGLIPTIN PHOS-METFORMIN HCL 50-500 MG PO TABS: ORAL_TABLET | ORAL | Status: DC

## 2011-07-07 MED ORDER — ATORVASTATIN CALCIUM 20 MG PO TABS: 20.0000 mg | ORAL_TABLET | Freq: Every day | ORAL | Status: DC

## 2011-07-07 NOTE — Telephone Encounter (Signed)
Rx sent 

## 2011-07-12 ENCOUNTER — Ambulatory Visit (INDEPENDENT_AMBULATORY_CARE_PROVIDER_SITE_OTHER): Payer: BC Managed Care – PPO | Admitting: *Deleted

## 2011-07-12 DIAGNOSIS — Z23 Encounter for immunization: Secondary | ICD-10-CM

## 2011-07-22 ENCOUNTER — Encounter: Payer: Self-pay | Admitting: Internal Medicine

## 2011-07-22 ENCOUNTER — Ambulatory Visit (INDEPENDENT_AMBULATORY_CARE_PROVIDER_SITE_OTHER): Payer: BC Managed Care – PPO | Admitting: Internal Medicine

## 2011-07-22 VITALS — BP 144/88 | HR 80 | Temp 97.7°F | Wt 242.0 lb

## 2011-07-22 DIAGNOSIS — F411 Generalized anxiety disorder: Secondary | ICD-10-CM

## 2011-07-22 DIAGNOSIS — E119 Type 2 diabetes mellitus without complications: Secondary | ICD-10-CM

## 2011-07-22 DIAGNOSIS — E785 Hyperlipidemia, unspecified: Secondary | ICD-10-CM

## 2011-07-22 DIAGNOSIS — I10 Essential (primary) hypertension: Secondary | ICD-10-CM

## 2011-07-22 DIAGNOSIS — R7989 Other specified abnormal findings of blood chemistry: Secondary | ICD-10-CM

## 2011-07-22 LAB — ALT: ALT: 27 U/L (ref 0–53)

## 2011-07-22 LAB — LIPID PANEL: VLDL: 18.4 mg/dL (ref 0.0–40.0)

## 2011-07-22 LAB — BASIC METABOLIC PANEL
Calcium: 9.3 mg/dL (ref 8.4–10.5)
Creatinine, Ser: 1.1 mg/dL (ref 0.4–1.5)

## 2011-07-22 LAB — HEMOGLOBIN A1C: Hgb A1c MFr Bld: 6.4 % (ref 4.6–6.5)

## 2011-07-22 LAB — AST: AST: 18 U/L (ref 0–37)

## 2011-07-22 NOTE — Assessment & Plan Note (Signed)
Changed klonopin to 1/2 bid, sx improved dramatically "70%" Plan: increase to tid prn

## 2011-07-22 NOTE — Assessment & Plan Note (Signed)
BP slt elevated today, labs, see instructions
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
## 2011-07-22 NOTE — Assessment & Plan Note (Signed)
Recheck labs 

## 2011-07-22 NOTE — Assessment & Plan Note (Signed)
Good med compliance , labs  

## 2011-07-22 NOTE — Patient Instructions (Signed)
Check the  blood pressure 2 or 3 times a week, be sure it is less than 140/85. If it is consistently higher, let me know  

## 2011-07-22 NOTE — Assessment & Plan Note (Signed)
Due for labs

## 2011-07-27 ENCOUNTER — Encounter: Payer: Self-pay | Admitting: Internal Medicine

## 2011-08-03 ENCOUNTER — Ambulatory Visit (INDEPENDENT_AMBULATORY_CARE_PROVIDER_SITE_OTHER): Payer: BC Managed Care – PPO | Admitting: Cardiology

## 2011-08-03 ENCOUNTER — Encounter: Payer: Self-pay | Admitting: Cardiology

## 2011-08-03 DIAGNOSIS — E785 Hyperlipidemia, unspecified: Secondary | ICD-10-CM

## 2011-08-03 DIAGNOSIS — E669 Obesity, unspecified: Secondary | ICD-10-CM

## 2011-08-03 DIAGNOSIS — E119 Type 2 diabetes mellitus without complications: Secondary | ICD-10-CM

## 2011-08-03 DIAGNOSIS — I10 Essential (primary) hypertension: Secondary | ICD-10-CM

## 2011-08-03 NOTE — Assessment & Plan Note (Signed)
He brings a blood pressure diary. His blood pressures are well controlled. At this point no change in therapy is indicated.

## 2011-08-03 NOTE — Assessment & Plan Note (Signed)
His weight has come down nicely with diet and exercise. He will continue with more of this.

## 2011-08-03 NOTE — Patient Instructions (Signed)
The current medical regimen is effective;  continue present plan and medications.  Follow up in 18 months with Dr Hochrein.  You will receive a letter in the mail 2 months before you are due.  Please call us when you receive this letter to schedule your follow up appointment.  

## 2011-08-03 NOTE — Progress Notes (Signed)
HPI The patient presents for follow up of multiple cardiovascular risk factors.  Since I last saw him he has done well.  The patient denies any new symptoms such as chest discomfort, neck or arm discomfort. There has been no new shortness of breath, PND or orthopnea. There have been no reported palpitations, presyncope or syncope.  He exercises routinely and aggressively.      He had a negative stress perfusion study in 2011.  No Known Allergies  Current Outpatient Prescriptions  Medication Sig Dispense Refill  . allopurinol (ZYLOPRIM) 100 MG tablet Take 100 mg by mouth daily.        [name] amLODipine-benazepril (LOTREL) 10-40 MG per capsule Take 1 capsule by mouth daily.      [name] atorvastatin (LIPITOR) 20 MG tablet Take 1 tablet (20 mg total) by mouth at bedtime.  30 tablet  0  . Blood Glucose Monitoring Suppl (ACCU-CHEK COMPACT CARE KIT) KIT 1 machine.  1 each  0  . clonazePAM (KLONOPIN) 0.5 MG tablet Take 0.25 mg by mouth 3 (three) times daily as needed.      . fish oil-omega-3 fatty acids 1000 MG capsule Take 2 g by mouth 2 (two) times daily.        [name] glipiZIDE (GLUCOTROL) 10 MG tablet 1/4-1/2 tablet only as needed  60 tablet  1  . glucose blood (ACCU-CHEK ACTIVE STRIPS) test strip Bid- dx 250.00  100 each  5  . glucose blood (ACCU-CHEK ADVANTAGE TEST) test strip Two times daily- dx 250.00  100 each  5  . glucose blood (ACCU-CHEK COMPACT TEST DRUM) test strip Use as instructed  100 each  1  . hydrochlorothiazide 25 MG tablet TAKE 1 TABLET BY MOUTH EVERY DAY  30 tablet  2  . Lancets (ACCU-CHEK SOFT TOUCH) lancets Bid- 250.00- dx  100 each  5  . meclizine (ANTIVERT) 12.5 MG tablet Take 1 tablet (12.5 mg total) by mouth 3 (three) times daily as needed for dizziness or nausea.  30 tablet  1  . Multiple Vitamins-Minerals (MULTIVITAMIN,TX-MINERALS) tablet Take 1 tablet by mouth daily.        . potassium citrate (UROCIT-K) 10 MEQ (1080 MG) SR tablet Take 10 mEq by mouth 2 (two) times daily.          . sitaGLIPtan-metformin (JANUMET) 50-500 MG per tablet take 1 tablet by mouth twice a day  60 tablet  0  . Tamsulosin HCl (FLOMAX) 0.4 MG CAPS Take 0.4 mg by mouth at bedtime.          Past Medical History  Diagnosis Date  . DM (diabetes mellitus)   . Other and unspecified hyperlipidemia   . HTN (hypertension)   . Osteoarthritis   . Nephrolithiasis   . Anxiety   . Depression   . OSA on CPAP   . Normal nuclear stress test     Past Surgical History  Procedure Date  . Knee surgery     reconstruction  . Femur fracture surgery   . Lithotripsy     ROS:  As stated in the HPI and negative for all other systems.  PHYSICAL EXAM BP 130/85  Pulse 73  Ht 6' (1.829 m)  Wt 242 lb (109.77 kg)  BMI 32.82 kg/m2 GENERAL:  Well appearing HEENT:  Pupils equal round and reactive, fundi not visualized, oral mucosa unremarkable NECK:  No jugular venous distention, waveform within normal limits, carotid upstroke brisk and symmetric, no bruits, no thyromegaly LYMPHATICS:  No cervical, inguinal  adenopathy LUNGS:  Clear to auscultation bilaterally BACK:  No CVA tenderness CHEST:  Unremarkable HEART:  PMI not displaced or sustained,S1 and S2 within normal limits, no S3, no S4, no clicks, no rubs, no murmurs ABD:  Flat, positive bowel sounds normal in frequency in pitch, no bruits, no rebound, no guarding, no midline pulsatile mass, no hepatomegaly, no splenomegaly EXT:  2 plus pulses throughout, no edema, no cyanosis no clubbing SKIN:  No rashes no nodules NEURO:  Cranial nerves II through XII grossly intact, motor grossly intact throughout PSYCH:  Cognitively intact, oriented to person place and time  EKG:  Sinus rhythm, rate 73, axis within normal limits, intervals within normal limits, no acute ST-T wave changes.  ASSESSMENT AND PLAN

## 2011-08-03 NOTE — Assessment & Plan Note (Signed)
His hemoglobin A1c was 6.4. He will continue with diet and medications as listed. He's is having am hyperglycemia and I asked him to discuss this with Dr. Drue Novel.

## 2011-08-03 NOTE — Assessment & Plan Note (Signed)
I reviewed his lipid profile. His HDL is slightly low his LDL is 40s. He will continue with meds as listed.

## 2011-09-06 ENCOUNTER — Telehealth: Payer: Self-pay | Admitting: Internal Medicine

## 2011-09-06 MED ORDER — ATORVASTATIN CALCIUM 20 MG PO TABS: 20.0000 mg | ORAL_TABLET | Freq: Every day | ORAL | Status: DC

## 2011-09-06 NOTE — Telephone Encounter (Signed)
Prescription sent to pharmacy.

## 2011-09-06 NOTE — Telephone Encounter (Signed)
Refill: Atorvastatin 20mg  tablet. Take 1 tablet by mouth at bedtime. Qty 30. Last fill 08-07-11

## 2011-09-28 ENCOUNTER — Other Ambulatory Visit: Payer: Self-pay | Admitting: Internal Medicine

## 2011-09-28 NOTE — Telephone Encounter (Signed)
Refill done.  

## 2011-09-29 ENCOUNTER — Other Ambulatory Visit: Payer: Self-pay | Admitting: Internal Medicine

## 2011-09-29 NOTE — Telephone Encounter (Signed)
Refill Clonazepam 0.5mg  tab Qty 60 Take 1 or  Two tablets by mouth at bed time if needed  Last filled 1.2.13

## 2011-09-29 NOTE — Telephone Encounter (Signed)
OK to fill

## 2011-09-29 NOTE — Telephone Encounter (Signed)
According to last OV note Clonazepam 0.5mg  was discontinued.

## 2011-10-01 ENCOUNTER — Telehealth: Payer: Self-pay | Admitting: *Deleted

## 2011-10-01 MED ORDER — CLONAZEPAM 0.5 MG PO TABS: 0.2500 mg | ORAL_TABLET | Freq: Three times a day (TID) | ORAL | Status: DC | PRN

## 2011-10-01 NOTE — Telephone Encounter (Signed)
#  30 until Dr Drue Novel can review

## 2011-10-01 NOTE — Telephone Encounter (Signed)
Refill done.  

## 2011-10-01 NOTE — Telephone Encounter (Signed)
Refill request Klonopin 0.5mg  1/2 tab TID #60 with zero refills. Last refilled on 1.24.13. OK to refill?

## 2011-10-04 NOTE — Telephone Encounter (Signed)
Ok 60, 3 RF 

## 2011-10-22 ENCOUNTER — Telehealth: Payer: Self-pay | Admitting: Internal Medicine

## 2011-10-22 MED ORDER — CLONAZEPAM 0.5 MG PO TABS: 0.2500 mg | ORAL_TABLET | Freq: Three times a day (TID) | ORAL | Status: DC | PRN

## 2011-10-22 NOTE — Telephone Encounter (Signed)
Ok 60 and 5 RF 

## 2011-10-22 NOTE — Telephone Encounter (Signed)
Refill done.  

## 2011-10-22 NOTE — Telephone Encounter (Signed)
Your patient , Leonard [name]

## 2011-10-22 NOTE — Telephone Encounter (Signed)
Caller: Sam/; PCP: Marga Melnick; CB#: [phone]; ; ; Call regarding Medication Issue;  [facility] calling regarding refill for pts Clonazepam 0.5 mg. Takes 1/2 tab po TID, last had #30 filled on 10/01/11.

## 2011-11-08 ENCOUNTER — Other Ambulatory Visit: Payer: Self-pay | Admitting: Internal Medicine

## 2011-11-08 NOTE — Telephone Encounter (Signed)
Refill done.  

## 2011-11-19 ENCOUNTER — Encounter: Payer: BC Managed Care – PPO | Admitting: Internal Medicine

## 2012-02-07 ENCOUNTER — Encounter: Payer: BC Managed Care – PPO | Admitting: Internal Medicine

## 2012-03-27 ENCOUNTER — Ambulatory Visit (INDEPENDENT_AMBULATORY_CARE_PROVIDER_SITE_OTHER): Payer: BC Managed Care – PPO | Admitting: Pulmonary Disease

## 2012-03-27 ENCOUNTER — Encounter: Payer: Self-pay | Admitting: Pulmonary Disease

## 2012-03-27 VITALS — BP 130/82 | HR 62 | Temp 97.5°F | Ht 72.0 in | Wt 251.2 lb

## 2012-03-27 DIAGNOSIS — G4733 Obstructive sleep apnea (adult) (pediatric): Secondary | ICD-10-CM

## 2012-03-27 NOTE — Progress Notes (Signed)
  Subjective:    Patient ID: Leonard [name], male    DOB: 1955/11/27, 56 y.o.   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 The sleep efficiency of 65%. Although the oral AHI was 8 per hour, but REM related AHI was31.8. The lowest desaturation was 87%. Longest hypopnea was 43 seconds. His lowest heart rate was 52 beats per minute during non-REM sleep. No PLMs or arrhythmias were noted.  August15, 2011  Had good results with autoCPAP but lost to FU x 3 yrs. Feels more refreshed. He has lost from 295 to 258 lbs  Changed to 8 cm based on download   9/12 Download on 8 cm shows no residual events, AHI 1.1/h, leak +, good compliance    03/27/2012 1 yr FU Wt 251 -gained 10 lbs in the last yr Snoring once in a while  since the pressure was lowered, only when he is very tired otherwise it is fine Mask ok, pressure ok  Download 9/13 >> no residuals 1/h on cpap 8 cm, good usage, leak ok   Review of Systems neg for any significant sore throat, dysphagia, itching, sneezing, nasal congestion or excess/ purulent secretions, fever, chills, sweats, unintended wt loss, pleuritic or exertional cp, hempoptysis, orthopnea pnd or change in chronic leg swelling. Also denies presyncope, palpitations, heartburn, abdominal pain, nausea, vomiting, diarrhea or change in bowel or urinary habits, dysuria,hematuria, rash, arthralgias, visual complaints, headache, numbness weakness or ataxia.     Objective:   Physical Exam  Gen. Pleasant, well-nourished, in no distress ENT - no lesions, no post nasal drip Neck: No JVD, no thyromegaly, no carotid bruits Lungs: no use of accessory muscles, no dullness to percussion, clear without rales or rhonchi  Cardiovascular: Rhythm regular, heart sounds  normal, no murmurs or gallops, no peripheral  edema Musculoskeletal: No deformities, no cyanosis or clubbing         Assessment & Plan:

## 2012-03-27 NOTE — Progress Notes (Deleted)
Patient ID: Leonard [name], male   DOB: 05-06-1956, 56 y.o.   MRN: [id number]

## 2012-03-27 NOTE — Assessment & Plan Note (Signed)
Increase pressure to 10 cm for snoring & chk download in 1 month Weight loss encouraged, compliance with goal of at least 4-6 hrs every night is the expectation. Advised against medications with sedative side effects Cautioned against driving when sleepy - understanding that sleepiness will vary on a day to day basis

## 2012-03-27 NOTE — Patient Instructions (Signed)
Flu shot Increase pressure to 10 cm for snoring & chk download in 1 month

## 2012-03-28 ENCOUNTER — Other Ambulatory Visit: Payer: Self-pay | Admitting: Internal Medicine

## 2012-03-28 ENCOUNTER — Other Ambulatory Visit: Payer: Self-pay

## 2012-03-28 ENCOUNTER — Encounter: Payer: Self-pay | Admitting: Pulmonary Disease

## 2012-03-28 MED ORDER — GLUCOSE BLOOD VI STRP: ORAL_STRIP | Status: DC

## 2012-03-28 NOTE — Telephone Encounter (Signed)
Rx sent for 100strips no refills.    MW

## 2012-03-29 NOTE — Telephone Encounter (Signed)
Refill done.  

## 2012-04-06 ENCOUNTER — Ambulatory Visit (INDEPENDENT_AMBULATORY_CARE_PROVIDER_SITE_OTHER): Payer: BC Managed Care – PPO | Admitting: Internal Medicine

## 2012-04-06 ENCOUNTER — Encounter: Payer: Self-pay | Admitting: Internal Medicine

## 2012-04-06 VITALS — BP 122/74 | HR 71 | Temp 97.8°F | Ht 71.25 in | Wt 247.0 lb

## 2012-04-06 DIAGNOSIS — Z23 Encounter for immunization: Secondary | ICD-10-CM

## 2012-04-06 DIAGNOSIS — I10 Essential (primary) hypertension: Secondary | ICD-10-CM

## 2012-04-06 DIAGNOSIS — F411 Generalized anxiety disorder: Secondary | ICD-10-CM

## 2012-04-06 DIAGNOSIS — E119 Type 2 diabetes mellitus without complications: Secondary | ICD-10-CM

## 2012-04-06 DIAGNOSIS — E785 Hyperlipidemia, unspecified: Secondary | ICD-10-CM

## 2012-04-06 DIAGNOSIS — Z0000 Encounter for general adult medical examination without abnormal findings: Secondary | ICD-10-CM

## 2012-04-06 LAB — LIPID PANEL
Total CHOL/HDL Ratio: 3
VLDL: 20.6 mg/dL (ref 0.0–40.0)

## 2012-04-06 LAB — COMPREHENSIVE METABOLIC PANEL
ALT: 25 U/L (ref 0–53)
AST: 20 U/L (ref 0–37)
CO2: 24 mEq/L (ref 19–32)
Chloride: 106 mEq/L (ref 96–112)
GFR: 79.35 mL/min (ref >60.00)
Sodium: 138 mEq/L (ref 135–145)
Total Bilirubin: 0.6 mg/dL (ref 0.3–1.2)
Total Protein: 7.9 g/dL (ref 6.0–8.3)

## 2012-04-06 LAB — HEMOGLOBIN A1C: Hgb A1c MFr Bld: 6.6 % — ABNORMAL HIGH (ref 4.6–6.5)

## 2012-04-06 NOTE — Assessment & Plan Note (Signed)
Td 08  flu shot--today PSAs per urology Saw  Dr. Logan Bores [phone], had a prostate exam, PSA wnl per pt   reports a Cscope at age 56---normal per patient ,at [facility]-- will get records  See history of present illness, lifestyle has not been the best in the last few months but a week ago he started to make some corrections.

## 2012-04-06 NOTE — Assessment & Plan Note (Signed)
Check a FLP 
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
## 2012-04-06 NOTE — Assessment & Plan Note (Addendum)
Will check A1c. Lifestyle has not been the best lately about a week ago started to make some corrections. RTC 4 months

## 2012-04-06 NOTE — Assessment & Plan Note (Addendum)
BPs ranges from 120/80, 140/90, mostly in the lower side. BP today wnl reassess on RTC

## 2012-04-06 NOTE — Assessment & Plan Note (Addendum)
See history of present illness. A lot of stress at work, recommend counseling, another option is  to see psychiatry again. Continue with clonazepam

## 2012-04-08 ENCOUNTER — Other Ambulatory Visit: Payer: Self-pay | Admitting: Internal Medicine

## 2012-04-10 NOTE — Telephone Encounter (Signed)
Refill done.  

## 2012-04-11 ENCOUNTER — Encounter: Payer: Self-pay | Admitting: *Deleted

## 2012-04-13 ENCOUNTER — Other Ambulatory Visit: Payer: Self-pay | Admitting: Internal Medicine

## 2012-04-13 NOTE — Telephone Encounter (Signed)
Rx sent.    MW 

## 2012-04-14 NOTE — Telephone Encounter (Signed)
Refill done.  

## 2012-04-18 ENCOUNTER — Encounter: Payer: Self-pay | Admitting: *Deleted

## 2012-04-18 LAB — HM DIABETES EYE EXAM: HM Diabetic Eye Exam: NORMAL

## 2012-04-25 ENCOUNTER — Other Ambulatory Visit: Payer: Self-pay | Admitting: Internal Medicine

## 2012-04-25 MED ORDER — CLONAZEPAM 0.5 MG PO TABS: 0.2500 mg | ORAL_TABLET | Freq: Two times a day (BID) | ORAL | Status: DC | PRN

## 2012-04-25 NOTE — Telephone Encounter (Signed)
Ok to refill 

## 2012-04-25 NOTE — Telephone Encounter (Signed)
done

## 2012-04-25 NOTE — Telephone Encounter (Signed)
refill clonazepam 0.5mg  tab #45 take 1/2 tablet by mouth three tomes daily as needed -- last fill 10.1.13--last ov 10.10.13-V70

## 2012-05-05 ENCOUNTER — Other Ambulatory Visit: Payer: Self-pay | Admitting: Internal Medicine

## 2012-05-05 NOTE — Telephone Encounter (Signed)
Refill done.  

## 2012-05-08 ENCOUNTER — Telehealth: Payer: Self-pay | Admitting: Pulmonary Disease

## 2012-05-08 DIAGNOSIS — G4733 Obstructive sleep apnea (adult) (pediatric): Secondary | ICD-10-CM

## 2012-05-08 NOTE — Telephone Encounter (Signed)
Download 10/13  On 8cm - excellent usage, no residuals CPAP working well

## 2012-05-10 NOTE — Telephone Encounter (Signed)
Pt returned call. Leonard [name] °

## 2012-05-10 NOTE — Telephone Encounter (Signed)
I called [facility] to get correct pressure setting. On 03/28/12 pt pressure was increased to 10 CM. Pt is requesting to be decreased to 8 cm.

## 2012-05-10 NOTE — Telephone Encounter (Signed)
Do you mean going up to 10 cm - is so, OK to do so ?

## 2012-05-10 NOTE — Telephone Encounter (Signed)
Ok to lower to 8 cm

## 2012-05-10 NOTE — Telephone Encounter (Signed)
I spoke with patient about results and he verbalized understanding. Pt states his setting on cpap is 10 or 12 cm. Pt is requesting to go back down to 8 cm. Pt stated his snoring is much worse and he is not sleeping well at all. He is waking up every couple hours. Please advise Dr. Vassie Loll thanks

## 2012-05-10 NOTE — Telephone Encounter (Signed)
lmomtcb x1 

## 2012-05-11 NOTE — Telephone Encounter (Signed)
I spoke with pt and is aware RA okay'd for his pressure to be lowered to 8 cm. I have sent order to [facility] will forward message to [facility] to ensure this was received

## 2012-06-05 ENCOUNTER — Telehealth: Payer: Self-pay | Admitting: *Deleted

## 2012-06-05 NOTE — Telephone Encounter (Signed)
PT left VM on triage line stating that his BS have been elevated in the AM running 160-170 so he increase his glipizide to 1 tab a day which have kept BS in range. Pt would like to know if he can get med change to 1 tab daily instead of 1/4 to 1/2 tab as indicated on Rx

## 2012-06-06 MED ORDER — GLIPIZIDE 10 MG PO TABS: 10.0000 mg | ORAL_TABLET | Freq: Every day | ORAL | Status: DC

## 2012-06-06 NOTE — Telephone Encounter (Signed)
Discussed with pt. Pt states that since he has increased his glipizide he has now ran out of med early & is requesting a refill sent into [facility].  Refill sent.

## 2012-06-06 NOTE — Telephone Encounter (Signed)
Okay to change if so desired, watch for low sugar symptoms

## 2012-07-05 ENCOUNTER — Other Ambulatory Visit: Payer: Self-pay | Admitting: Internal Medicine

## 2012-07-06 NOTE — Telephone Encounter (Signed)
Refill done.  

## 2012-07-28 ENCOUNTER — Other Ambulatory Visit: Payer: Self-pay | Admitting: Internal Medicine

## 2012-07-28 NOTE — Telephone Encounter (Signed)
Refill done.  

## 2012-08-08 ENCOUNTER — Ambulatory Visit: Payer: BC Managed Care – PPO | Admitting: Internal Medicine

## 2012-09-04 ENCOUNTER — Encounter: Payer: Self-pay | Admitting: *Deleted

## 2012-09-05 ENCOUNTER — Ambulatory Visit: Payer: BC Managed Care – PPO | Admitting: Internal Medicine

## 2012-10-03 ENCOUNTER — Encounter: Payer: Self-pay | Admitting: Lab

## 2012-10-04 ENCOUNTER — Encounter: Payer: Self-pay | Admitting: Internal Medicine

## 2012-10-04 ENCOUNTER — Ambulatory Visit (INDEPENDENT_AMBULATORY_CARE_PROVIDER_SITE_OTHER): Payer: BC Managed Care – PPO | Admitting: Internal Medicine

## 2012-10-04 VITALS — BP 122/82 | HR 64 | Temp 98.3°F | Wt 250.0 lb

## 2012-10-04 DIAGNOSIS — I10 Essential (primary) hypertension: Secondary | ICD-10-CM

## 2012-10-04 DIAGNOSIS — F341 Dysthymic disorder: Secondary | ICD-10-CM

## 2012-10-04 DIAGNOSIS — Z01818 Encounter for other preprocedural examination: Secondary | ICD-10-CM | POA: Insufficient documentation

## 2012-10-04 DIAGNOSIS — E785 Hyperlipidemia, unspecified: Secondary | ICD-10-CM

## 2012-10-04 DIAGNOSIS — M1990 Unspecified osteoarthritis, unspecified site: Secondary | ICD-10-CM

## 2012-10-04 DIAGNOSIS — E119 Type 2 diabetes mellitus without complications: Secondary | ICD-10-CM

## 2012-10-04 DIAGNOSIS — F329 Major depressive disorder, single episode, unspecified: Secondary | ICD-10-CM

## 2012-10-04 LAB — COMPREHENSIVE METABOLIC PANEL
ALT: 27 U/L (ref 0–53)
AST: 21 U/L (ref 0–37)
Calcium: 9.3 mg/dL (ref 8.4–10.5)
Chloride: 106 mEq/L (ref 96–112)
Creatinine, Ser: 1 mg/dL (ref 0.4–1.5)
Potassium: 4 mEq/L (ref 3.5–5.1)
Sodium: 139 mEq/L (ref 135–145)

## 2012-10-04 LAB — CBC WITH DIFFERENTIAL/PLATELET
Basophils Absolute: 0 10*3/uL (ref 0.0–0.1)
Eosinophils Absolute: 0.3 10*3/uL (ref 0.0–0.7)
Hemoglobin: 14.7 g/dL (ref 13.0–17.0)
Lymphocytes Relative: 29 % (ref 12.0–46.0)
Lymphs Abs: 1.9 10*3/uL (ref 0.7–4.0)
MCHC: 33.8 g/dL (ref 30.0–36.0)
Neutro Abs: 3.7 10*3/uL (ref 1.4–7.7)
RDW: 13.7 % (ref 11.5–14.6)

## 2012-10-04 NOTE — Assessment & Plan Note (Signed)
Well-controlled, no change, labs 
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
## 2012-10-04 NOTE — Assessment & Plan Note (Addendum)
CBGs slightly elevated in the morning but normal in the afternoon. Has gained some weight, diet and exercise discussed. exercise discussed. Labs

## 2012-10-04 NOTE — Patient Instructions (Addendum)
Next visit in 3-4 months 

## 2012-10-04 NOTE — Assessment & Plan Note (Signed)
Surgical clearance 57 year old gentleman with diabetes, high cholesterol, hypertension and sleep apnea on a CPAP.[name] Chronic medical problems well controlled, he is quite active without any symptoms, last EKG last year normal. He is doing well, he is clear for surgery pending labs from today. Will cc this note to Dr. Despina Hick

## 2012-10-04 NOTE — Assessment & Plan Note (Signed)
Well-controlled per last cholesterol panel 

## 2012-10-04 NOTE — Assessment & Plan Note (Signed)
To have bilateral knee replacement 11/02/2011.

## 2012-10-04 NOTE — Assessment & Plan Note (Addendum)
On and off issue, currently with mild depression, I like him to see a different psychiatrist for further eval, he did not agree with a diagnosis of bipolar from previous psychiatrist although he has a strong family history of bipolarity; patient would like to defer that after knee surgery, that is okay, advised to call me if symptoms increase.

## 2012-10-09 ENCOUNTER — Encounter: Payer: Self-pay | Admitting: *Deleted

## 2012-10-12 ENCOUNTER — Other Ambulatory Visit: Payer: Self-pay | Admitting: Orthopedic Surgery

## 2012-10-12 MED ORDER — DEXAMETHASONE SODIUM PHOSPHATE 10 MG/ML IJ SOLN: 10.0000 mg | Freq: Once | INTRAMUSCULAR | Status: DC

## 2012-10-12 NOTE — Progress Notes (Signed)
Preoperative surgical orders have been place into the Epic hospital system for Leonard [name] on 10/12/2012, 1:13 PM  by Patrica Duel for surgery on 11/01/2012.  Preop Bilateral Total Knee orders including Epidural per Anesthesia, IV Tylenol, and IV Decadron as long as there are no contraindications to the above medications. Avel Peace, PA-C

## 2012-10-19 ENCOUNTER — Encounter (HOSPITAL_COMMUNITY): Payer: Self-pay | Admitting: Pharmacy Technician

## 2012-10-20 ENCOUNTER — Encounter (HOSPITAL_COMMUNITY)
Admission: RE | Admit: 2012-10-20 | Discharge: 2012-10-20 | Disposition: A | Discharge status: Home / Self Care | Payer: BC Managed Care – PPO | Source: Ambulatory Visit | Attending: Orthopedic Surgery | Admitting: Orthopedic Surgery

## 2012-10-20 ENCOUNTER — Ambulatory Visit (HOSPITAL_COMMUNITY)
Admission: RE | Admit: 2012-10-20 | Discharge: 2012-10-20 | Disposition: A | Discharge status: Home / Self Care | Payer: BC Managed Care – PPO | Source: Ambulatory Visit | Attending: Orthopedic Surgery | Admitting: Orthopedic Surgery

## 2012-10-20 ENCOUNTER — Encounter (HOSPITAL_COMMUNITY): Payer: Self-pay

## 2012-10-20 DIAGNOSIS — E119 Type 2 diabetes mellitus without complications: Secondary | ICD-10-CM | POA: Insufficient documentation

## 2012-10-20 DIAGNOSIS — Z01818 Encounter for other preprocedural examination: Secondary | ICD-10-CM | POA: Insufficient documentation

## 2012-10-20 DIAGNOSIS — Z01810 Encounter for preprocedural cardiovascular examination: Secondary | ICD-10-CM | POA: Insufficient documentation

## 2012-10-20 DIAGNOSIS — M1710 Unilateral primary osteoarthritis, unspecified knee: Secondary | ICD-10-CM | POA: Insufficient documentation

## 2012-10-20 DIAGNOSIS — I10 Essential (primary) hypertension: Secondary | ICD-10-CM | POA: Insufficient documentation

## 2012-10-20 DIAGNOSIS — Z01812 Encounter for preprocedural laboratory examination: Secondary | ICD-10-CM | POA: Insufficient documentation

## 2012-10-20 HISTORY — DX: Epistaxis: R04.0

## 2012-10-20 HISTORY — DX: Atherosclerosis of renal artery: I70.1

## 2012-10-20 LAB — CBC
HCT: 41.4 % (ref 39.0–52.0)
Hemoglobin: 14.6 g/dL (ref 13.0–17.0)
MCH: 29.7 pg (ref 26.0–34.0)
MCHC: 35.3 g/dL (ref 30.0–36.0)
MCV: 84.1 fL (ref 78.0–100.0)
Platelets: 260 10*3/uL (ref 150–400)
RBC: 4.92 MIL/uL (ref 4.22–5.81)
RDW: 12.9 % (ref 11.5–15.5)
WBC: 8.7 10*3/uL (ref 4.0–10.5)

## 2012-10-20 LAB — COMPREHENSIVE METABOLIC PANEL
ALT: 35 U/L (ref 0–53)
AST: 24 U/L (ref 0–37)
Alkaline Phosphatase: 56 U/L (ref 39–117)
CO2: 26 mEq/L (ref 19–32)
Chloride: 102 mEq/L (ref 96–112)
GFR calc non Af Amer: 76 mL/min — ABNORMAL LOW (ref >90)
Glucose, Bld: 91 mg/dL (ref 70–99)
Sodium: 139 mEq/L (ref 135–145)
Total Bilirubin: 0.3 mg/dL (ref 0.3–1.2)

## 2012-10-20 LAB — URINALYSIS, ROUTINE W REFLEX MICROSCOPIC
Bilirubin Urine: NEGATIVE
Glucose, UA: NEGATIVE mg/dL
Hgb urine dipstick: NEGATIVE
Ketones, ur: NEGATIVE mg/dL
Leukocytes, UA: NEGATIVE
Nitrite: NEGATIVE
Protein, ur: NEGATIVE mg/dL
Specific Gravity, Urine: 1.012 (ref 1.005–1.030)
Urobilinogen, UA: 0.2 mg/dL (ref 0.0–1.0)
pH: 7 (ref 5.0–8.0)

## 2012-10-20 LAB — APTT: aPTT: 27 s (ref 24–37)

## 2012-10-20 NOTE — Patient Instructions (Addendum)
[address] [facility]  10/20/2012   Your procedure is scheduled on: 11/01/12  Report to [facility] at 0830 AM.  Call this number if you have problems the morning of surgery 336-: [phone]   Remember:   Do not eat food or drink liquids After Midnight.     Take these medicines the morning of surgery with A SIP OF WATER: Klonopin if needed   Do not wear jewelry, make-up or nail polish.  Do not wear lotions, powders, or perfumes. You may wear deodorant.  Do not shave 48 hours prior to surgery. Men may shave face and neck.  Do not bring valuables to the hospital.  Contacts, dentures or bridgework may not be worn into surgery.  Leave suitcase in the car. After surgery it may be brought to your room.  For patients admitted to the hospital, checkout time is 11:00 AM the day of discharge.   Please read over the following fact sheets that you were given: MRSA Information, blood fact sheet, incentive spirometry fact sheet,   Birdie Sons, RN  pre op nurse call if needed [phone]    FAILURE TO FOLLOW THESE INSTRUCTIONS MAY RESULT IN CANCELLATION OF YOUR SURGERY   Patient Signature: ___________________________________________

## 2012-10-20 NOTE — Progress Notes (Signed)
Stress test Dr. [place] 03/31/10 on chart

## 2012-10-20 NOTE — Progress Notes (Signed)
Pt states "when someone is drawing blood or putting IV in; I will pass out if they move the needle around."

## 2012-10-26 ENCOUNTER — Encounter: Payer: Self-pay | Admitting: Internal Medicine

## 2012-10-31 ENCOUNTER — Other Ambulatory Visit: Payer: Self-pay | Admitting: Orthopedic Surgery
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
## 2012-11-01 ENCOUNTER — Encounter (HOSPITAL_COMMUNITY)
Admission: RE | Disposition: A | Discharge status: Rehabilitation Facility | Payer: Self-pay | Source: Ambulatory Visit | Attending: Orthopedic Surgery

## 2012-11-01 ENCOUNTER — Encounter (HOSPITAL_COMMUNITY): Payer: Self-pay | Admitting: *Deleted

## 2012-11-01 ENCOUNTER — Ambulatory Visit (HOSPITAL_COMMUNITY): Payer: BC Managed Care – PPO | Admitting: Anesthesiology

## 2012-11-01 ENCOUNTER — Encounter (HOSPITAL_COMMUNITY): Payer: Self-pay | Admitting: Anesthesiology

## 2012-11-01 ENCOUNTER — Inpatient Hospital Stay (HOSPITAL_COMMUNITY)
Admission: RE | Admit: 2012-11-01 | Discharge: 2012-11-06 | DRG: 471 | Disposition: A | Discharge status: Rehabilitation Facility | Payer: BC Managed Care – PPO | Source: Ambulatory Visit | Attending: Orthopedic Surgery | Admitting: Orthopedic Surgery

## 2012-11-01 DIAGNOSIS — R209 Unspecified disturbances of skin sensation: Secondary | ICD-10-CM | POA: Diagnosis not present

## 2012-11-01 DIAGNOSIS — G4733 Obstructive sleep apnea (adult) (pediatric): Secondary | ICD-10-CM | POA: Diagnosis present

## 2012-11-01 DIAGNOSIS — F411 Generalized anxiety disorder: Secondary | ICD-10-CM | POA: Diagnosis present

## 2012-11-01 DIAGNOSIS — E7800 Pure hypercholesterolemia, unspecified: Secondary | ICD-10-CM | POA: Diagnosis present

## 2012-11-01 DIAGNOSIS — E871 Hypo-osmolality and hyponatremia: Secondary | ICD-10-CM

## 2012-11-01 DIAGNOSIS — I701 Atherosclerosis of renal artery: Secondary | ICD-10-CM | POA: Diagnosis present

## 2012-11-01 DIAGNOSIS — Z79899 Other long term (current) drug therapy: Secondary | ICD-10-CM

## 2012-11-01 DIAGNOSIS — E119 Type 2 diabetes mellitus without complications: Secondary | ICD-10-CM | POA: Diagnosis present

## 2012-11-01 DIAGNOSIS — Z96653 Presence of artificial knee joint, bilateral: Secondary | ICD-10-CM

## 2012-11-01 DIAGNOSIS — F3289 Other specified depressive episodes: Secondary | ICD-10-CM | POA: Diagnosis present

## 2012-11-01 DIAGNOSIS — M1710 Unilateral primary osteoarthritis, unspecified knee: Principal | ICD-10-CM | POA: Diagnosis present

## 2012-11-01 DIAGNOSIS — I9589 Other hypotension: Secondary | ICD-10-CM | POA: Diagnosis not present

## 2012-11-01 DIAGNOSIS — I10 Essential (primary) hypertension: Secondary | ICD-10-CM | POA: Diagnosis present

## 2012-11-01 DIAGNOSIS — F329 Major depressive disorder, single episode, unspecified: Secondary | ICD-10-CM | POA: Diagnosis present

## 2012-11-01 DIAGNOSIS — N400 Enlarged prostate without lower urinary tract symptoms: Secondary | ICD-10-CM | POA: Diagnosis present

## 2012-11-01 DIAGNOSIS — Z87442 Personal history of urinary calculi: Secondary | ICD-10-CM

## 2012-11-01 HISTORY — PX: TOTAL KNEE ARTHROPLASTY: SHX125

## 2012-11-01 LAB — TYPE AND SCREEN

## 2012-11-01 LAB — GLUCOSE, CAPILLARY
Glucose-Capillary: 154 mg/dL — ABNORMAL HIGH (ref 70–99)
Glucose-Capillary: 179 mg/dL — ABNORMAL HIGH (ref 70–99)
Glucose-Capillary: 264 mg/dL — ABNORMAL HIGH (ref 70–99)

## 2012-11-01 LAB — ABO/RH: ABO/RH(D): O POS

## 2012-11-01 SURGERY — ARTHROPLASTY, KNEE, BILATERAL, TOTAL
Anesthesia: Epidural | Site: Knee | Laterality: Bilateral | Wound class: Clean

## 2012-11-01 MED ORDER — NALOXONE HCL 1 MG/ML IJ SOLN: 1.0000 ug/kg/h | INTRAVENOUS | Status: DC | PRN

## 2012-11-01 MED ORDER — DIPHENHYDRAMINE HCL 50 MG/ML IJ SOLN: 25.0000 mg | INTRAMUSCULAR | Status: DC | PRN

## 2012-11-01 MED ORDER — POTASSIUM CITRATE ER 10 MEQ (1080 MG) PO TBCR
10.0000 meq | EXTENDED_RELEASE_TABLET | Freq: Two times a day (BID) | ORAL | Status: DC
  Administered 2012-11-01 – 2012-11-06 (×10): 10 meq via ORAL
  Filled 2012-11-01 (×11): qty 1

## 2012-11-01 MED ORDER — NALBUPHINE HCL 10 MG/ML IJ SOLN
5.0000 mg | INTRAMUSCULAR | Status: DC | PRN
  Filled 2012-11-01: qty 1

## 2012-11-01 MED ORDER — DEXAMETHASONE 6 MG PO TABS
10.0000 mg | ORAL_TABLET | Freq: Every day | ORAL | Status: AC
  Administered 2012-11-02: 10 mg via ORAL
  Filled 2012-11-01: qty 1

## 2012-11-01 MED ORDER — ACETAMINOPHEN 650 MG RE SUPP: 650.0000 mg | Freq: Four times a day (QID) | RECTAL | Status: DC | PRN

## 2012-11-01 MED ORDER — ONDANSETRON HCL 4 MG/2ML IJ SOLN: 4.0000 mg | Freq: Four times a day (QID) | INTRAMUSCULAR | Status: DC | PRN

## 2012-11-01 MED ORDER — DIPHENHYDRAMINE HCL 25 MG PO CAPS
25.0000 mg | ORAL_CAPSULE | ORAL | Status: DC | PRN
  Filled 2012-11-01: qty 1

## 2012-11-01 MED ORDER — MEPERIDINE HCL 50 MG/ML IJ SOLN: 6.2500 mg | INTRAMUSCULAR | Status: DC | PRN

## 2012-11-01 MED ORDER — BUPIVACAINE HCL (PF) 0.25 % IJ SOLN
INTRAMUSCULAR | Status: DC | PRN
  Administered 2012-11-01: 5 mL via EPIDURAL

## 2012-11-01 MED ORDER — METOCLOPRAMIDE HCL 5 MG/ML IJ SOLN: 10.0000 mg | Freq: Three times a day (TID) | INTRAMUSCULAR | Status: DC | PRN

## 2012-11-01 MED ORDER — HYDROMORPHONE HCL PF 1 MG/ML IJ SOLN: 0.2500 mg | INTRAMUSCULAR | Status: DC | PRN

## 2012-11-01 MED ORDER — ACETAMINOPHEN 10 MG/ML IV SOLN
1000.0000 mg | Freq: Once | INTRAVENOUS | Status: AC
  Administered 2012-11-01: 1000 mg via INTRAVENOUS

## 2012-11-01 MED ORDER — BENAZEPRIL HCL 40 MG PO TABS
40.0000 mg | ORAL_TABLET | Freq: Every day | ORAL | Status: DC
  Administered 2012-11-02 – 2012-11-05 (×4): 40 mg via ORAL
  Filled 2012-11-01 (×6): qty 1

## 2012-11-01 MED ORDER — NALOXONE HCL 0.4 MG/ML IJ SOLN: 0.4000 mg | INTRAMUSCULAR | Status: DC | PRN

## 2012-11-01 MED ORDER — ONDANSETRON HCL 4 MG/2ML IJ SOLN: 4.0000 mg | Freq: Three times a day (TID) | INTRAMUSCULAR | Status: DC | PRN

## 2012-11-01 MED ORDER — CLONAZEPAM 0.5 MG PO TABS: 0.2500 mg | ORAL_TABLET | Freq: Two times a day (BID) | ORAL | Status: DC | PRN

## 2012-11-01 MED ORDER — BUPIVACAINE HCL (PF) 0.75 % IJ SOLN: INTRAMUSCULAR | Status: DC | PRN

## 2012-11-01 MED ORDER — PROMETHAZINE HCL 25 MG/ML IJ SOLN: 6.2500 mg | INTRAMUSCULAR | Status: DC | PRN

## 2012-11-01 MED ORDER — ACETAMINOPHEN 10 MG/ML IV SOLN
INTRAVENOUS | Status: AC
  Filled 2012-11-01: qty 100

## 2012-11-01 MED ORDER — BUPIVACAINE LIPOSOME 1.3 % IJ SUSP
20.0000 mL | Freq: Once | INTRAMUSCULAR | Status: DC
  Filled 2012-11-01: qty 20

## 2012-11-01 MED ORDER — DIPHENHYDRAMINE HCL 12.5 MG/5ML PO ELIX: 12.5000 mg | ORAL_SOLUTION | ORAL | Status: DC | PRN

## 2012-11-01 MED ORDER — ATORVASTATIN CALCIUM 20 MG PO TABS
20.0000 mg | ORAL_TABLET | Freq: Every day | ORAL | Status: DC
  Administered 2012-11-01 – 2012-11-05 (×5): 20 mg via ORAL
  Filled 2012-11-01 (×6): qty 1

## 2012-11-01 MED ORDER — SCOPOLAMINE 1 MG/3DAYS TD PT72
1.0000 | MEDICATED_PATCH | Freq: Once | TRANSDERMAL | Status: DC
  Filled 2012-11-01: qty 1

## 2012-11-01 MED ORDER — POTASSIUM CHLORIDE IN NACL 20-0.9 MEQ/L-% IV SOLN
INTRAVENOUS | Status: DC
  Administered 2012-11-01 – 2012-11-02 (×2): 100 mL/h via INTRAVENOUS
  Filled 2012-11-01 (×14): qty 1000

## 2012-11-01 MED ORDER — DOCUSATE SODIUM 100 MG PO CAPS
100.0000 mg | ORAL_CAPSULE | Freq: Two times a day (BID) | ORAL | Status: DC
  Administered 2012-11-01 – 2012-11-06 (×9): 100 mg via ORAL
  Filled 2012-11-01 (×5): qty 1

## 2012-11-01 MED ORDER — SODIUM CHLORIDE 0.9 % IV BOLUS (SEPSIS)
500.0000 mL | Freq: Once | INTRAVENOUS | Status: AC
  Administered 2012-11-01: 500 mL via INTRAVENOUS

## 2012-11-01 MED ORDER — CEFAZOLIN SODIUM-DEXTROSE 2-3 GM-% IV SOLR
2.0000 g | Freq: Four times a day (QID) | INTRAVENOUS | Status: AC
  Administered 2012-11-01 – 2012-11-02 (×2): 2 g via INTRAVENOUS
  Filled 2012-11-01 (×2): qty 50

## 2012-11-01 MED ORDER — LACTATED RINGERS IV SOLN
INTRAVENOUS | Status: DC
  Administered 2012-11-01 (×2): via INTRAVENOUS

## 2012-11-01 MED ORDER — METFORMIN HCL 500 MG PO TABS
500.0000 mg | ORAL_TABLET | Freq: Two times a day (BID) | ORAL | Status: DC
  Administered 2012-11-01 – 2012-11-06 (×10): 500 mg via ORAL
  Filled 2012-11-01 (×12): qty 1

## 2012-11-01 MED ORDER — MORPHINE SULFATE 2 MG/ML IJ SOLN: 1.0000 mg | INTRAMUSCULAR | Status: DC | PRN

## 2012-11-01 MED ORDER — ONDANSETRON HCL 4 MG PO TABS: 4.0000 mg | ORAL_TABLET | Freq: Four times a day (QID) | ORAL | Status: DC | PRN

## 2012-11-01 MED ORDER — CEFAZOLIN SODIUM-DEXTROSE 2-3 GM-% IV SOLR
2.0000 g | INTRAVENOUS | Status: AC
Start: 2012-11-01 — End: 2012-11-01
  Administered 2012-11-01: 2 g via INTRAVENOUS

## 2012-11-01 MED ORDER — PROPOFOL INFUSION 10 MG/ML OPTIME
INTRAVENOUS | Status: DC | PRN
  Administered 2012-11-01: 60 ug/kg/min via INTRAVENOUS

## 2012-11-01 MED ORDER — SCOPOLAMINE 1 MG/3DAYS TD PT72
1.0000 | MEDICATED_PATCH | Freq: Once | TRANSDERMAL | Status: DC
Start: 2012-11-01 — End: 2012-11-06
  Filled 2012-11-01: qty 1

## 2012-11-01 MED ORDER — BISACODYL 10 MG RE SUPP
10.0000 mg | Freq: Every day | RECTAL | Status: DC | PRN
  Administered 2012-11-06: 10 mg via RECTAL
  Filled 2012-11-01: qty 1

## 2012-11-01 MED ORDER — BUPIVACAINE HCL (PF) 0.25 % IJ SOLN
INTRAMUSCULAR | Status: AC
  Filled 2012-11-01: qty 30

## 2012-11-01 MED ORDER — FENTANYL CITRATE 0.05 MG/ML IJ SOLN
INTRAMUSCULAR | Status: DC | PRN
  Administered 2012-11-01 (×5): 50 ug via INTRAVENOUS

## 2012-11-01 MED ORDER — KETOROLAC TROMETHAMINE 30 MG/ML IJ SOLN: 30.0000 mg | Freq: Four times a day (QID) | INTRAMUSCULAR | Status: DC | PRN

## 2012-11-01 MED ORDER — KETOROLAC TROMETHAMINE 60 MG/2ML IM SOLN
60.0000 mg | Freq: Once | INTRAMUSCULAR | Status: AC | PRN
  Filled 2012-11-01: qty 2

## 2012-11-01 MED ORDER — INSULIN ASPART 100 UNIT/ML ~~LOC~~ SOLN
0.0000 [IU] | Freq: Three times a day (TID) | SUBCUTANEOUS | Status: DC
  Administered 2012-11-02 – 2012-11-03 (×4): 3 [IU] via SUBCUTANEOUS
  Administered 2012-11-03: 2 [IU] via SUBCUTANEOUS
  Administered 2012-11-04 – 2012-11-06 (×7): 3 [IU] via SUBCUTANEOUS
  Administered 2012-11-06: 5 [IU] via SUBCUTANEOUS

## 2012-11-01 MED ORDER — POLYETHYLENE GLYCOL 3350 17 G PO PACK
17.0000 g | PACK | Freq: Every day | ORAL | Status: DC | PRN
  Administered 2012-11-05: 17 g via ORAL
  Filled 2012-11-01 (×3): qty 1

## 2012-11-01 MED ORDER — BUPIVACAINE IN DEXTROSE 0.75-8.25 % IT SOLN
INTRATHECAL | Status: DC | PRN
  Administered 2012-11-01: 2 mL via INTRATHECAL

## 2012-11-01 MED ORDER — DIPHENHYDRAMINE HCL 50 MG/ML IJ SOLN: 12.5000 mg | INTRAMUSCULAR | Status: DC | PRN

## 2012-11-01 MED ORDER — ACETAMINOPHEN 10 MG/ML IV SOLN
1000.0000 mg | Freq: Four times a day (QID) | INTRAVENOUS | Status: AC
  Administered 2012-11-01 – 2012-11-02 (×4): 1000 mg via INTRAVENOUS
  Filled 2012-11-01 (×6): qty 100

## 2012-11-01 MED ORDER — SODIUM CHLORIDE 0.9 % IJ SOLN: 3.0000 mL | INTRAMUSCULAR | Status: DC | PRN

## 2012-11-01 MED ORDER — AMLODIPINE BESYLATE 10 MG PO TABS
10.0000 mg | ORAL_TABLET | Freq: Every day | ORAL | Status: DC
  Administered 2012-11-02 – 2012-11-05 (×4): 10 mg via ORAL
  Filled 2012-11-01 (×6): qty 1

## 2012-11-01 MED ORDER — LINAGLIPTIN 5 MG PO TABS
5.0000 mg | ORAL_TABLET | Freq: Every day | ORAL | Status: DC
  Administered 2012-11-01 – 2012-11-06 (×6): 5 mg via ORAL
  Filled 2012-11-01 (×7): qty 1

## 2012-11-01 MED ORDER — TRAMADOL HCL 50 MG PO TABS
50.0000 mg | ORAL_TABLET | Freq: Four times a day (QID) | ORAL | Status: DC | PRN
  Administered 2012-11-04: 100 mg via ORAL
  Filled 2012-11-01: qty 2

## 2012-11-01 MED ORDER — SITAGLIPTIN PHOS-METFORMIN HCL 50-500 MG PO TABS: 1.0000 | ORAL_TABLET | Freq: Two times a day (BID) | ORAL | Status: DC

## 2012-11-01 MED ORDER — SODIUM CHLORIDE 0.9 % IV SOLN
INTRAVENOUS | Status: DC
  Administered 2012-11-01 – 2012-11-03 (×4): via EPIDURAL
  Filled 2012-11-01 (×25): qty 25

## 2012-11-01 MED ORDER — METOCLOPRAMIDE HCL 5 MG/ML IJ SOLN: 5.0000 mg | Freq: Three times a day (TID) | INTRAMUSCULAR | Status: DC | PRN

## 2012-11-01 MED ORDER — DEXAMETHASONE SODIUM PHOSPHATE 10 MG/ML IJ SOLN
INTRAMUSCULAR | Status: DC | PRN
  Administered 2012-11-01: 10 mg via INTRAVENOUS

## 2012-11-01 MED ORDER — SODIUM CHLORIDE 0.9 % IV SOLN: INTRAVENOUS | Status: DC

## 2012-11-01 MED ORDER — METOCLOPRAMIDE HCL 5 MG PO TABS
5.0000 mg | ORAL_TABLET | Freq: Three times a day (TID) | ORAL | Status: DC | PRN
  Filled 2012-11-01: qty 2

## 2012-11-01 MED ORDER — LACTATED RINGERS IV SOLN
INTRAVENOUS | Status: DC | PRN
  Administered 2012-11-01: 14:00:00 via INTRAVENOUS

## 2012-11-01 MED ORDER — MENTHOL 3 MG MT LOZG
1.0000 | LOZENGE | OROMUCOSAL | Status: DC | PRN
  Filled 2012-11-01: qty 9

## 2012-11-01 MED ORDER — CEFAZOLIN SODIUM-DEXTROSE 2-3 GM-% IV SOLR
INTRAVENOUS | Status: AC
  Filled 2012-11-01: qty 50

## 2012-11-01 MED ORDER — ALLOPURINOL 100 MG PO TABS
100.0000 mg | ORAL_TABLET | Freq: Every day | ORAL | Status: DC
  Administered 2012-11-01 – 2012-11-06 (×6): 100 mg via ORAL
  Filled 2012-11-01 (×7): qty 1

## 2012-11-01 MED ORDER — KETOROLAC TROMETHAMINE 30 MG/ML IJ SOLN
30.0000 mg | Freq: Four times a day (QID) | INTRAMUSCULAR | Status: DC | PRN
  Administered 2012-11-01 – 2012-11-02 (×2): 30 mg via INTRAVENOUS
  Filled 2012-11-01 (×2): qty 1

## 2012-11-01 MED ORDER — TAMSULOSIN HCL 0.4 MG PO CAPS
0.4000 mg | ORAL_CAPSULE | Freq: Every day | ORAL | Status: DC
  Administered 2012-11-02 – 2012-11-05 (×4): 0.4 mg via ORAL
  Filled 2012-11-01 (×6): qty 1

## 2012-11-01 MED ORDER — ACETAMINOPHEN 325 MG PO TABS
650.0000 mg | ORAL_TABLET | Freq: Four times a day (QID) | ORAL | Status: DC | PRN
  Administered 2012-11-04: 650 mg via ORAL
  Filled 2012-11-01: qty 2

## 2012-11-01 MED ORDER — METHOCARBAMOL 100 MG/ML IJ SOLN
500.0000 mg | Freq: Four times a day (QID) | INTRAVENOUS | Status: DC | PRN
  Filled 2012-11-01: qty 5

## 2012-11-01 MED ORDER — FLEET ENEMA 7-19 GM/118ML RE ENEM: 1.0000 | ENEMA | Freq: Once | RECTAL | Status: AC | PRN

## 2012-11-01 MED ORDER — HYDROCHLOROTHIAZIDE 25 MG PO TABS
25.0000 mg | ORAL_TABLET | Freq: Every day | ORAL | Status: DC
  Administered 2012-11-03 – 2012-11-06 (×4): 25 mg via ORAL
  Filled 2012-11-01 (×7): qty 1

## 2012-11-01 MED ORDER — SODIUM CHLORIDE 0.9 % IV SOLN
Freq: Once | INTRAVENOUS | Status: AC
  Administered 2012-11-01: 1000 mL via INTRAVENOUS

## 2012-11-01 MED ORDER — GLIPIZIDE 5 MG PO TABS
5.0000 mg | ORAL_TABLET | Freq: Every day | ORAL | Status: DC | PRN
  Filled 2012-11-01: qty 1

## 2012-11-01 MED ORDER — OXYCODONE HCL 5 MG PO TABS
5.0000 mg | ORAL_TABLET | ORAL | Status: DC | PRN
  Administered 2012-11-01 – 2012-11-02 (×2): 10 mg via ORAL
  Administered 2012-11-02 – 2012-11-03 (×6): 20 mg via ORAL
  Administered 2012-11-03: 15 mg via ORAL
  Administered 2012-11-03: 5 mg via ORAL
  Administered 2012-11-03: 20 mg via ORAL
  Administered 2012-11-04: 10 mg via ORAL
  Administered 2012-11-04 (×2): 20 mg via ORAL
  Administered 2012-11-04 – 2012-11-05 (×9): 10 mg via ORAL
  Administered 2012-11-06 (×2): 15 mg via ORAL
  Filled 2012-11-01: qty 4
  Filled 2012-11-01 (×4): qty 2
  Filled 2012-11-01 (×2): qty 4
  Filled 2012-11-01: qty 2
  Filled 2012-11-01: qty 4
  Filled 2012-11-01: qty 3
  Filled 2012-11-01: qty 2
  Filled 2012-11-01 (×2): qty 4
  Filled 2012-11-01: qty 2
  Filled 2012-11-01: qty 4
  Filled 2012-11-01: qty 3
  Filled 2012-11-01 (×5): qty 2
  Filled 2012-11-01: qty 4
  Filled 2012-11-01: qty 3
  Filled 2012-11-01: qty 2
  Filled 2012-11-01: qty 4
  Filled 2012-11-01: qty 1

## 2012-11-01 MED ORDER — DEXAMETHASONE SODIUM PHOSPHATE 10 MG/ML IJ SOLN
10.0000 mg | Freq: Every day | INTRAMUSCULAR | Status: AC
  Filled 2012-11-01: qty 1

## 2012-11-01 MED ORDER — AMLODIPINE BESY-BENAZEPRIL HCL 10-40 MG PO CAPS: 1.0000 | ORAL_CAPSULE | Freq: Every day | ORAL | Status: DC

## 2012-11-01 MED ORDER — ONDANSETRON HCL 4 MG/2ML IJ SOLN
INTRAMUSCULAR | Status: DC | PRN
  Administered 2012-11-01: 4 mg via INTRAVENOUS

## 2012-11-01 MED ORDER — PHENOL 1.4 % MT LIQD: 1.0000 | OROMUCOSAL | Status: DC | PRN

## 2012-11-01 MED ORDER — KETAMINE HCL 10 MG/ML IJ SOLN
INTRAMUSCULAR | Status: DC | PRN
  Administered 2012-11-01 (×3): 20 mg via INTRAVENOUS

## 2012-11-01 MED ORDER — METHOCARBAMOL 500 MG PO TABS
500.0000 mg | ORAL_TABLET | Freq: Four times a day (QID) | ORAL | Status: DC | PRN
  Administered 2012-11-01 – 2012-11-06 (×15): 500 mg via ORAL
  Filled 2012-11-01 (×16): qty 1

## 2012-11-01 MED ORDER — MIDAZOLAM HCL 5 MG/5ML IJ SOLN
INTRAMUSCULAR | Status: DC | PRN
  Administered 2012-11-01: 2 mg via INTRAVENOUS

## 2012-11-01 SURGICAL SUPPLY — 57 items
AUTOTRANSFUSION W/QD PVC DRAIN (AUTOTRANSFUSION) ×4 IMPLANT
BAG SPEC THK2 15X12 ZIP CLS (MISCELLANEOUS) ×2
BAG ZIPLOCK 12X15 (MISCELLANEOUS) ×4 IMPLANT
BANDAGE ELASTIC 6 VELCRO ST LF (GAUZE/BANDAGES/DRESSINGS) ×4 IMPLANT
BANDAGE ESMARK 6X9 LF (GAUZE/BANDAGES/DRESSINGS) ×2 IMPLANT
BLADE SAG 18X100X1.27 (BLADE) ×4 IMPLANT
BLADE SAW SGTL 11.0X1.19X90.0M (BLADE) ×3 IMPLANT
BLADE SURG SZ10 CARB STEEL (BLADE) ×4 IMPLANT
BNDG CMPR 9X6 STRL LF SNTH (GAUZE/BANDAGES/DRESSINGS) ×2
BNDG COHESIVE 6X5 TAN STRL LF (GAUZE/BANDAGES/DRESSINGS) ×2 IMPLANT
BNDG ESMARK 6X9 LF (GAUZE/BANDAGES/DRESSINGS) ×4
BOWL SMART MIX CTS (DISPOSABLE) ×4 IMPLANT
CEMENT HV SMART SET (Cement) ×8 IMPLANT
CLOTH BEACON ORANGE TIMEOUT ST (SAFETY) ×2 IMPLANT
CUFF TOURN SGL QUICK 34 (TOURNIQUET CUFF) ×4
CUFF TRNQT CYL 34X4X40X1 (TOURNIQUET CUFF) ×2 IMPLANT
DRAPE EXTREMITY BILATERAL (DRAPE) ×2 IMPLANT
DRAPE INCISE IOBAN 66X45 STRL (DRAPES) ×2 IMPLANT
DRAPE POUCH INSTRU U-SHP 10X18 (DRAPES) ×2 IMPLANT
DRAPE U-SHAPE 47X51 STRL (DRAPES) ×6 IMPLANT
DRSG ADAPTIC 3X8 NADH LF (GAUZE/BANDAGES/DRESSINGS) ×4 IMPLANT
DRSG PAD ABDOMINAL 8X10 ST (GAUZE/BANDAGES/DRESSINGS) ×4 IMPLANT
DURAPREP 26ML APPLICATOR (WOUND CARE) ×4 IMPLANT
ELECT REM PT RETURN 9FT ADLT (ELECTROSURGICAL) ×2
ELECTRODE REM PT RTRN 9FT ADLT (ELECTROSURGICAL) ×1 IMPLANT
FACESHIELD LNG OPTICON STERILE (SAFETY) ×16 IMPLANT
GLOVE BIO SURGEON STRL SZ7.5 (GLOVE) ×9 IMPLANT
GLOVE BIO SURGEON STRL SZ8 (GLOVE) ×5 IMPLANT
GLOVE BIOGEL PI IND STRL 8 (GLOVE) ×2 IMPLANT
GLOVE BIOGEL PI INDICATOR 8 (GLOVE) ×2
GOWN STRL NON-REIN LRG LVL3 (GOWN DISPOSABLE) ×2 IMPLANT
GOWN STRL REIN XL XLG (GOWN DISPOSABLE) ×2 IMPLANT
HANDPIECE INTERPULSE COAX TIP (DISPOSABLE) ×2
IMMOBILIZER KNEE 20 (SOFTGOODS) ×4
IMMOBILIZER KNEE 20 THIGH 36 (SOFTGOODS) ×2 IMPLANT
KIT BASIN OR (CUSTOM PROCEDURE TRAY) ×2 IMPLANT
MANIFOLD NEPTUNE II (INSTRUMENTS) ×2 IMPLANT
NDL SAFETY ECLIPSE 18X1.5 (NEEDLE) ×1 IMPLANT
NEEDLE HYPO 18GX1.5 SHARP (NEEDLE) ×2
NS IRRIG 1000ML POUR BTL (IV SOLUTION) ×2 IMPLANT
PACK TOTAL JOINT (CUSTOM PROCEDURE TRAY) ×2 IMPLANT
PADDING CAST COTTON 6X4 STRL (CAST SUPPLIES) ×7 IMPLANT
SET HNDPC FAN SPRY TIP SCT (DISPOSABLE) ×1 IMPLANT
SPONGE GAUZE 4X4 12PLY (GAUZE/BANDAGES/DRESSINGS) ×2 IMPLANT
SPONGE LAP 18X18 X RAY DECT (DISPOSABLE) ×2 IMPLANT
STOCKINETTE 8 INCH (MISCELLANEOUS) ×3 IMPLANT
STRIP CLOSURE SKIN 1/2X4 (GAUZE/BANDAGES/DRESSINGS) ×6 IMPLANT
SUCTION FRAZIER 12FR DISP (SUCTIONS) ×2 IMPLANT
SUT MNCRL AB 4-0 PS2 18 (SUTURE) ×4 IMPLANT
SUT VIC AB 2-0 CT1 27 (SUTURE) ×12
SUT VIC AB 2-0 CT1 TAPERPNT 27 (SUTURE) ×6 IMPLANT
SUT VLOC 180 0 24IN GS25 (SUTURE) ×4 IMPLANT
SYR 50ML LL SCALE MARK (SYRINGE) ×2 IMPLANT
TOWEL OR 17X26 10 PK STRL BLUE (TOWEL DISPOSABLE) ×4 IMPLANT
TRAY FOLEY CATH 14FRSI W/METER (CATHETERS) ×2 IMPLANT
WATER STERILE IRR 1500ML POUR (IV SOLUTION) ×3 IMPLANT
WRAP KNEE MAXI GEL POST OP (GAUZE/BANDAGES/DRESSINGS) ×8 IMPLANT

## 2012-11-01 NOTE — Progress Notes (Signed)
Left sided auto vac 300 cc; connected to infuse

## 2012-11-01 NOTE — Progress Notes (Signed)
Epidural checked by Marzella Schlein, RN

## 2012-11-01 NOTE — Progress Notes (Signed)
S: He states that back discomfort has decreased to 1/10. His right leg is numb and his left knee hurts some. He will be getting oxycodone now for that pain. Subjectively, he looks comfortable.  O: BP 132/88   HR 63   O2sat 97%  P: Hesitant to adjust epidural as his vitals are now stable and his pain is decently controlled. Continue current therapy. Discussed with patient that epidural will come out Friday.

## 2012-11-01 NOTE — Op Note (Addendum)
Pre-operative diagnosis- Osteoarthritis  Bilateral knee(s)  Post-operative diagnosis- Osteoarthritis Bilateral knee(s)  Procedure-  Bilateral  Total Knee Arthroplasty  Surgeon- Gus Rankin. Babetta Paterson, MD  Assistant- Avel Peace, PA-C   Anesthesia-  Spinal and Epidural EBL-* No blood loss amount entered *  Drains Autovac x 1 each side  Tourniquet time-  Total Tourniquet Time Documented: Thigh (Bilateral) - 41 minutes Total: Thigh (Bilateral) - 41 minutes    Complications- None  Condition-PACU - hemodynamically stable.   Brief Clinical Note  Leonard [name] is a 57 y.o. year old male with end stage OA of both knees with progressively worsening pain and dysfunction. He has significant pain, with activity  and significant functional deficits. He has had extensive non-op management including analgesics, and home exercise program, but remains in significant pain with significant dysfunction. We discussed doing both knees in the same setting vs. One at a time. We discussed the pros and cons of each approach including procedure, risks and potential complications and he elects to proceed with bilateral TKA. He presents now for bilateral Total Knee Arthroplasty.    Procedure in detail---   The patient is brought into the operating room and positioned supine on the operating table. After successful administration of  Spinal and Epidural,   a tourniquet is placed high on the  Bilateral thigh(s) and the lower extremities are prepped and draped in the usual sterile fashion. Time out is performed by the operating team and then the right lower extremity is wrapped in Esmarch, knee flexed and the tourniquet inflated to 300 mmHg.       A midline incision is made with a ten blade through the subcutaneous tissue to the level of the extensor mechanism. A fresh blade is used to make a medial parapatellar arthrotomy. Soft tissue over the proximal medial tibia is subperiosteally elevated to the joint line with a  knife and into the semimembranosus bursa with a Cobb elevator. Soft tissue over the proximal lateral tibia is elevated with attention being paid to avoiding the patellar tendon on the tibial tubercle. The patella is everted, knee flexed 90 degrees and the ACL and PCL are removed. Findings are bone on bone medial and patellofemoral with significant tibial subluxation and massive medial osteophytes.        The drill is used to create a starting hole in the distal femur and the canal is thoroughly irrigated with sterile saline to remove the fatty contents. The 5 degree Right  valgus alignment guide is placed into the femoral canal and the distal femoral cutting block is pinned to remove 10 mm off the distal femur. Resection is made with an oscillating saw.      The tibia is subluxed forward and the menisci are removed. The extramedullary alignment guide is placed referencing proximally at the medial aspect of the tibial tubercle and distally along the second metatarsal axis and tibial crest. The block is pinned to remove 2mm off the more deficient medial  side. He had a massive medial defect thus the resection was larger than usual. Resection is made with an oscillating saw. Size 5is the most appropriate size for the tibia and the proximal tibia is prepared with the modular drill and keel punch for that size.      The femoral sizing guide is placed and size 4 is most appropriate. Rotation is marked off the epicondylar axis and confirmed by creating a rectangular flexion gap at 90 degrees. The size 4 cutting block is pinned in  this rotation and the anterior, posterior and chamfer cuts are made with the oscillating saw. The intercondylar block is then placed and that cut is made.      Trial size 5 tibial component, trial size 4 posterior stabilized femur and a 17.5  mm posterior stabilized rotating platform insert trial is placed. Full extension is achieved with excellent varus/valgus and anterior/posterior balance  throughout full range of motion. The patella is everted and thickness measured to be 27  mm. Free hand resection is taken to 15 mm, a 41 template is placed, lug holes are drilled, trial patella is placed, and it tracks normally. Osteophytes are removed off the posterior femur with the trial in place. All trials are removed and the cut bone surfaces prepared with pulsatile lavage. Cement is mixed and once ready for implantation, the size 5 tibial implant, size  4 posterior stabilized femoral component, and the size 41 patella are cemented in place and the patella is held with the clamp. The trial insert is placed and the knee held in full extension.  All extruded cement is removed and once the cement is hard the permanent 17.5 mm posterior stabilized rotating platform insert is placed into the tibial tray..The wound is copiously irrigated with saline solution and the extensor mechanism closed over a autovac drain with #1 PDS suture. The tourniquet is released for a total tourniquet time of 42  minutes. Flexion against gravity is 140 degrees and the patella tracks normally. Subcutaneous tissue is closed with 2.0 vicryl and subcuticular with running 4.0 Monocryl. The left knee is then addressed.      The left lower extremity is wrapped in esmarch and the tourniquet inflated to 300 mm Hg.  A midline incision is made with a ten blade through the subcutaneous tissue to the level of the extensor mechanism. A fresh blade is used to make a medial parapatellar arthrotomy. Soft tissue over the proximal medial tibia is subperiosteally elevated to the joint line with a knife and into the semimembranosus bursa with a Cobb elevator. Soft tissue over the proximal lateral tibia is elevated with attention being paid to avoiding the patellar tendon on the tibial tubercle. The patella is everted, knee flexed 90 degrees and the ACL and PCL are removed. Findings are bone on bone medial and patellofemoral with large medial osteophytes  and tibial subluxation.        The drill is used to create a starting hole in the distal femur and the canal is thoroughly irrigated with sterile saline to remove the fatty contents. The 5 degree Left  valgus alignment guide is placed into the femoral canal and the distal femoral cutting block is pinned to remove 10 mm off the distal femur. Resection is made with an oscillating saw.      The tibia is subluxed forward and the menisci are removed. The extramedullary alignment guide is placed referencing proximally at the medial aspect of the tibial tubercle and distally along the second metatarsal axis and tibial crest. The block is pinned to remove 2mm off the more deficient medial  side. Resection is made with an oscillating saw. Size 5is the most appropriate size for the tibia and the proximal tibia is prepared with the modular drill and keel punch for that size.      The femoral sizing guide is placed and size 5 is most appropriate. Rotation is marked off the epicondylar axis and confirmed by creating a rectangular flexion gap at 90 degrees. The size 5  cutting block is pinned in this rotation and the anterior, posterior and chamfer cuts are made with the oscillating saw. The intercondylar block is then placed and that cut is made.      Trial size 5 tibial component, trial size 5 posterior stabilized femur and a 15  mm posterior stabilized rotating platform insert trial is placed. Full extension is achieved with excellent varus/valgus and anterior/posterior balance throughout full range of motion. The patella is everted and thickness measured to be 27  mm. Free hand resection is taken to 15 mm, a 41 template is placed, lug holes are drilled, trial patella is placed, and it tracks normally. Osteophytes are removed off the posterior femur with the trial in place. All trials are removed and the cut bone surfaces prepared with pulsatile lavage. Cement is mixed and once ready for implantation, the size 5 tibial  implant, size  5 posterior stabilized femoral component, and the size 41 patella are cemented in place and the patella is held with the clamp. The trial insert is placed and the knee held in full extension. All extruded cement is removed and once the cement is hard the permanent 15 mm posterior stabilized rotating platform insert is placed into the tibial tray.      The wound is copiously irrigated with saline solution and the extensor mechanism closed over a autovac drain with #1 PDS suture. The tourniquet is released for a total tourniquet time of 42  minutes. Flexion against gravity is 140 degrees and the patella tracks normally. Subcutaneous tissue is closed with 2.0 vicryl and subcuticular with running 4.0 Monocryl. The incisions are cleaned and dried and steri-strips and a bulky sterile dressing are applied to each knee. The limbs are placed into knee immobilizers and the patient is awakened and transported to recovery in stable condition.      Please note that a surgical assistant was a medical necessity for this procedure in order to perform it in a safe and expeditious manner. Surgical assistant was necessary to retract the ligaments and vital neurovascular structures to prevent injury to them and also necessary for proper positioning of the limb to allow for anatomic placement of the prosthesis.             Gus Rankin Leonard Bardon, MD    11/01/2012, 2:04 PM

## 2012-11-01 NOTE — Interval H&P Note (Signed)
History and Physical Interval Note:  11/01/2012 10:25 AM  Leonard [name]  has presented today for surgery, with the diagnosis of bilateral knee oa   The various methods of treatment have been discussed with the patient and family. After consideration of risks, benefits and other options for treatment, the patient has consented to  Procedure(s): TOTAL KNEE BILATERAL (Bilateral) as a surgical intervention .  The patient's history has been reviewed, patient examined, no change in status, stable for surgery.  I have reviewed the patient's chart and labs.  Questions were answered to the patient's satisfaction.     [name]

## 2012-11-01 NOTE — Progress Notes (Signed)
Auto vac right knee 300 cc output; auto-transfused to left hand peripheral IV

## 2012-11-01 NOTE — Progress Notes (Signed)
Pt c/o "sensation of needle in back". Immediately pulse rate dropped and pt became diaphoretic and lightheaded as before. Place in mild Trendelenburg with immediate reduction of symptoms. Called CRNA to report pulse and sx. She will call anesthesia

## 2012-11-01 NOTE — Anesthesia Preprocedure Evaluation (Signed)
Anesthesia Evaluation  Patient identified by MRN, date of birth, ID band Patient awake  General Assessment Comment:OA, Diabetes, nephrolithiasis, bph, anxiety, renal artery stenosis, OSA on CPAP, normal nuclear stress test 2011  Reviewed: Allergy & Precautions, H&P , NPO status , Patient's Chart, lab work & pertinent test results  Airway Mallampati: II TM Distance: >3 FB Neck ROM: Full    Dental no notable dental hx.    Pulmonary sleep apnea ,  breath sounds clear to auscultation  Pulmonary exam normal       Cardiovascular Exercise Tolerance: Good hypertension, Pt. on medications + Peripheral Vascular Disease Rhythm:Regular Rate:Normal  ECG and CXR normal.   Neuro/Psych PSYCHIATRIC DISORDERS Anxiety Depression negative neurological ROS     GI/Hepatic negative GI ROS, Neg liver ROS,   Endo/Other  diabetes, Type 2, Oral Hypoglycemic Agents  Renal/GU Renal InsufficiencyRenal disease  negative genitourinary   Musculoskeletal negative musculoskeletal ROS (+)   Abdominal (+) + obese,   Peds negative pediatric ROS (+)  Hematology negative hematology ROS (+)   Anesthesia Other Findings   Reproductive/Obstetrics negative OB ROS                           Anesthesia Physical Anesthesia Plan  ASA: III  Anesthesia Plan: Spinal and Epidural   Post-op Pain Management: MAC Combined w/ Regional for Post-op pain   Induction: Intravenous  Airway Management Planned:   Additional Equipment:   Intra-op Plan:   Post-operative Plan: Extubation in OR  Informed Consent: I have reviewed the patients History and Physical, chart, labs and discussed the procedure including the risks, benefits and alternatives for the proposed anesthesia with the patient or authorized representative who has indicated his/her understanding and acceptance.   Dental advisory given  Plan Discussed with: CRNA  Anesthesia Plan  Comments: (Discussed risks/benefits of spinal including headache, backache, failure, bleeding, infection, and nerve damage. Patient consents to spinal. Questions answered. Coagulation studies and platelet count acceptable.)        Anesthesia Quick Evaluation

## 2012-11-01 NOTE — Anesthesia Procedure Notes (Addendum)
Epidural Patient location during procedure: holding area Start time: 11/01/2012 11:30 AM  Staffing Anesthesiologist: Azell Der Performed by: anesthesiologist   Preanesthetic Checklist Completed: patient identified, site marked, surgical consent, pre-op evaluation, timeout performed, IV checked, risks and benefits discussed and monitors and equipment checked  Epidural Patient position: sitting Prep: Betadine Patient monitoring: heart rate, continuous pulse ox, blood pressure and cardiac monitor Approach: midline Injection technique: LOR saline  Needle:  Needle type: Hustead  Needle gauge: 18 G Needle length: 9 cm and 9 Needle insertion depth: 7 cm Catheter type: closed end flexible Catheter size: 20 Guage Catheter at skin depth: 11 cm Test dose: negative and 1.5% lidocaine  Additional Notes Test dose 1.5% Lidocaine with epi 1:200,000  Patient tolerated the insertion well without complications.Reason for block:post-op pain management  Spinal  Patient location during procedure: OR Start time: 11/01/2012 11:30 AM Staffing Performed by: anesthesiologist  Preanesthetic Checklist Completed: patient identified, site marked, surgical consent, pre-op evaluation, timeout performed, IV checked, risks and benefits discussed and monitors and equipment checked Spinal Block Patient position: sitting Prep: Betadine Patient monitoring: heart rate, continuous pulse ox and blood pressure Injection technique: single-shot Needle Needle type: Pencan  Needle gauge: 27 G Needle length: 15 cm Additional Notes Expiration date of kit checked and confirmed. Patient tolerated procedure well, without complications.

## 2012-11-01 NOTE — Progress Notes (Signed)
Placed pt on cpap at 8 cmH2O with his mask/circuit from home. Unable to get a good seal with the ETCO2 nasal cannula in his nose. He said he would just do without the cpap for the night that he sometimes goes back packing for a week without wearing it & is fine. He's on continuous pulse ox, so will readdress if SpO2 drops too low.  Jacqulynn Cadet RRT

## 2012-11-01 NOTE — Progress Notes (Signed)
Epidural down to 12 ml/hr per Dr. Council Mechanic

## 2012-11-01 NOTE — Transfer of Care (Signed)
Immediate Anesthesia Transfer of Care Note  Patient: Leonard [name]  Procedure(s) Performed: Procedure(s): TOTAL KNEE BILATERAL (Bilateral)  Patient Location: PACU  Anesthesia Type:Spinal and Epidural  Level of Consciousness: sedated  Airway & Oxygen Therapy: Patient Spontanous Breathing and Patient connected to face mask oxygen  Post-op Assessment: Report given to PACU RN and Post -op Vital signs reviewed and stable  Post vital signs: Reviewed and stable  Complications: No apparent anesthesia complications

## 2012-11-01 NOTE — Progress Notes (Signed)
Left sided auto vac infusion complete; normal saline flushed and then disconnected to be auto vac drainage on both knees

## 2012-11-01 NOTE — Progress Notes (Signed)
Patient stated they feel" tired and about to black out." HR 36, BP 70/39.Skin pale, hot and diaphoretic. Tracey Shuford PA notified. New orders given. EKG, fluid bolus started. Will continue to monitor.

## 2012-11-01 NOTE — Progress Notes (Signed)
S: Called to evaluate epidural. Leonard [name] states that his right leg is very comfortable and his left knee hurts some. He has had a needle like sensation in his back starting about 9:55 PM which was a 7/10 severity but decreased to a 3/10 at 10:15 PM. He has had some vasovagal episodes where his heart rate and blood pressure have fallen. The first responded to fluid boluses. It was later discovered that his foley catheter was malfunctioning and his bladder may have been distended. Currently his heart rate and blood pressure are normal.  O: Epidural is in place and dressing is intact. Mild tenderness at epidural site. He is moving both feet to command. HR 67.  A/P: 1. One sided epidural, right greater than left. I pulled catheter back to 10 cm at skin, and re-taped catheter under sterile conditions.        2. Vasovagal symptoms, improved.       3. Mild back pain, improved.  Will re-check in an hour.

## 2012-11-01 NOTE — Anesthesia Postprocedure Evaluation (Signed)
  Anesthesia Post-op Note  Patient: Leonard [name]  Procedure(s) Performed: Procedure(s) (LRB): TOTAL KNEE BILATERAL (Bilateral)  Patient Location: PACU  Anesthesia Type: Spinal and epidural.  Level of Consciousness: awake and alert   Airway and Oxygen Therapy: Patient Spontanous Breathing  Post-op Pain: mild  Post-op Assessment: Post-op Vital signs reviewed, Patient's Cardiovascular Status Stable, Respiratory Function Stable, Patent Airway and No signs of Nausea or vomiting  Last Vitals:  Filed Vitals:   11/01/12 1630  BP:   Pulse: 62  Temp:   Resp: 15    Post-op Vital Signs: stable   Complications: No apparent anesthesia complications. Spinal receded. He could move feet. Epidural test dose negative. Comfortable and increased numbness after test dose. Epidural orders written.
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
## 2012-11-01 NOTE — Progress Notes (Signed)
Dr. Council Mechanic in, retaped epidural dressing; gave test dose to epidural, then epidural started per order at 15 ml/hr

## 2012-11-01 NOTE — Progress Notes (Signed)
autovac infused, saline to flush right

## 2012-11-02 ENCOUNTER — Encounter (HOSPITAL_COMMUNITY): Payer: Self-pay | Admitting: Orthopedic Surgery

## 2012-11-02 DIAGNOSIS — E871 Hypo-osmolality and hyponatremia: Secondary | ICD-10-CM

## 2012-11-02 DIAGNOSIS — M1710 Unilateral primary osteoarthritis, unspecified knee: Secondary | ICD-10-CM

## 2012-11-02 DIAGNOSIS — Z96659 Presence of unspecified artificial knee joint: Secondary | ICD-10-CM

## 2012-11-02 LAB — CBC
MCH: 28.5 pg (ref 26.0–34.0)
MCV: 83.1 fL (ref 78.0–100.0)
Platelets: 189 10*3/uL (ref 150–400)
RDW: 12.9 % (ref 11.5–15.5)
WBC: 13.8 10*3/uL — ABNORMAL HIGH (ref 4.0–10.5)

## 2012-11-02 LAB — GLUCOSE, CAPILLARY
Glucose-Capillary: 185 mg/dL — ABNORMAL HIGH (ref 70–99)
Glucose-Capillary: 169 mg/dL — ABNORMAL HIGH (ref 70–99)

## 2012-11-02 LAB — PROTIME-INR: Prothrombin Time: 16.3 seconds — ABNORMAL HIGH (ref 11.6–15.2)

## 2012-11-02 LAB — BASIC METABOLIC PANEL
Calcium: 8.4 mg/dL (ref 8.4–10.5)
Creatinine, Ser: 0.9 mg/dL (ref 0.50–1.35)
GFR calc Af Amer: >90 mL/min (ref >90)
Sodium: 134 mEq/L — ABNORMAL LOW (ref 135–145)

## 2012-11-02 MED ORDER — COUMADIN BOOK
Freq: Once | Status: AC
  Administered 2012-11-02: 16:00:00
  Filled 2012-11-02: qty 1

## 2012-11-02 MED ORDER — WARFARIN SODIUM 5 MG PO TABS
5.0000 mg | ORAL_TABLET | Freq: Once | ORAL | Status: AC
  Administered 2012-11-02: 5 mg via ORAL
  Filled 2012-11-02: qty 1

## 2012-11-02 MED ORDER — WARFARIN VIDEO
Freq: Once | Status: AC
  Administered 2012-11-02: 1

## 2012-11-02 MED ORDER — WARFARIN - PHARMACIST DOSING INPATIENT: Freq: Every day | Status: DC

## 2012-11-02 NOTE — Progress Notes (Signed)
11/02/12 1359  PT Visit Information  Last PT Received On 11/02/12  Assistance Needed +2  PT Time Calculation  PT Start Time 1311  PT Stop Time 1352  PT Time Calculation (min) 41 min  Subjective Data  Subjective I feel good  Patient Stated Goal home  after CIR vs SNF  Precautions  Precautions Knee;Fall  Required Braces or Orthoses Knee Immobilizer - Right;Knee Immobilizer - Left  Knee Immobilizer - Right Discontinue once straight leg raise with < 10 degree lag  Knee Immobilizer - Left Discontinue once straight leg raise with < 10 degree lag  Restrictions  Weight Bearing Restrictions No  RLE Weight Bearing WBAT  LLE Weight Bearing WBAT  Cognition  Arousal/Alertness Awake/alert  Behavior During Therapy WFL for tasks assessed/performed  Overall Cognitive Status Within Functional Limits for tasks assessed  Bed Mobility  Bed Mobility Sit to Supine  Sit to Supine 3: Mod assist  Details for Bed Mobility Assistance cues for hands and wt shift and bil LE management, assist wtih bil LEs  Transfers  Transfers Sit to Stand;Stand to Sit;Stand Pivot Transfers  Sit to Stand 1: +2 Total assist;From chair/3-in-1;With upper extremity assist  Sit to Stand: Patient Percentage 50%  Stand to Sit 1: +2 Total assist;To bed;To elevated surface;With upper extremity assist  Stand to Sit: Patient Percentage 40%  Stand Pivot Transfers 1: +2 Total assist  Stand Pivot Transfers: Patient Percentage 40%  Details for Transfer Assistance cues for hand placement and LE management; reequiring increased assist this pm especially to advance RLE and protect ankle due to lack of dcontrol/dorsiflexion  Ambulation/Gait  Ambulation/Gait Assistance 1: +2 Total assist  Ambulation/Gait: Patient Percentage 40%  Ambulation Distance (Feet) 5 Feet  Assistive device Rolling walker  Ambulation/Gait Assistance Details +2 for IVs, safety, to maintain upright, advance and control RLE even with KI in place; multimodal cues for  sequencing  Gait Pattern Step-to pattern  Total Joint Exercises  Ankle Circles/Pumps AROM;PROM;Both;10 reps  Heel Slides AAROM;Both;10 reps  Straight Leg Raises AAROM;Both;10 reps  PT - End of Session  Equipment Utilized During Treatment Gait belt;Right knee immobilizer;Left knee immobilizer  Activity Tolerance Treatment limited secondary to medical complications (Comment) (epidural with incr RLE numbness)  Patient left in bed;with call bell/phone within reach;with family/visitor present  Nurse Communication Mobility status  PT - Assessment/Plan  Comments on Treatment Session pt being evaluated by CIR staff upon PT departure; continues with incr RLE numbness and limited control; motivated to do PT  PT Plan Discharge plan remains appropriate;Frequency remains appropriate  PT Frequency 7X/week  Follow Up Recommendations CIR  PT equipment Rolling walker with 5" wheels  Acute Rehab PT Goals  Time For Goal Achievement 11/11/12  Potential to Achieve Goals Good  Pt will go Sit to Supine/Side with min assist  PT Goal: Sit to Supine/Side - Progress Progressing toward goal  Pt will go Sit to Stand with mod assist  PT Goal: Sit to Stand - Progress Progressing toward goal  Pt will go Stand to Sit with mod assist  PT Goal: Stand to Sit - Progress Progressing toward goal  Pt will Ambulate with min assist;with rolling walker;51 - 150 feet  PT Goal: Ambulate - Progress Progressing toward goal  Pt will Perform Home Exercise Program with min assist  PT Goal: Perform Home Exercise Program - Progress Progressing toward goal  PT General Charges  $$ ACUTE PT VISIT 1 Procedure  PT Treatments  $Gait Training 8-22 mins  $Therapeutic Exercise 8-22 mins  $  Therapeutic Activity 8-22 mins

## 2012-11-02 NOTE — Progress Notes (Signed)
Clinical Social Work Department BRIEF PSYCHOSOCIAL ASSESSMENT 11/02/2012  Patient:  Leonard [name], Leonard [name]     Account Number:  [id number]     Admit date:  11/01/2012  Clinical Social Worker:  Candie Chroman  Date/Time:  11/02/2012 02:04 PM  Referred by:  Physician  Date Referred:  11/02/2012 Referred for  SNF Placement   Other Referral:   Interview type:  Patient Other interview type:    PSYCHOSOCIAL DATA Living Status:  WIFE Admitted from facility:   Level of care:   Primary support name:  Marylu Lund Primary support relationship to patient:  SPOUSE Degree of support available:   unclear    CURRENT CONCERNS Current Concerns  Post-Acute Placement   Other Concerns:    SOCIAL WORK ASSESSMENT / PLAN Pt is a 57 yrold gentleman living at home prior to hospitalization. CSW met with pt to assist with d/c planning. Pt hopes to have rehab at CIR. If CIR is unable to assist he would like rehab at [facility]. SNF contacted and will assist, if needed. CSW will be available to assist with SNF placement if required.   Assessment/plan status:  Psychosocial Support/Ongoing Assessment of Needs Other assessment/ plan:   CIR VS SNF   Information/referral to community resources:   Insurance coverage reviewed.    PATIENT'S/FAMILY'S RESPONSE TO PLAN OF CARE: Pt is hoping to have rehab at [facility] but will consider [facility] if [employer] declines to approve CIR admission.   Cori Razor LCSW [phone]

## 2012-11-02 NOTE — Care Management Note (Addendum)
    Page 1 of 1   11/04/2012     8:31:05 PM   CARE MANAGEMENT NOTE 11/04/2012  Patient:  Leonard [name], Leonard [name]   Account Number:  [id number]  Date Initiated:  11/02/2012  Documentation initiated by:  Colleen Can  Subjective/Objective Assessment:   DX BILATERAL KNEE OSTEOARTHRITIS ; BILATERAL KNEE REPLACEMNT     Action/Plan:   CIR VS SNF  CM SPOKE WITH PT'S WIFE(PT ASLEEP)PLANS ARE FOR INPATIENT REHAB AT [facility] 1ST CHOICE; 2ND CHOICE SNF   Anticipated DC Date:  11/05/2012   Anticipated DC Plan:  IP REHAB FACILITY  In-house referral  Clinical Social Worker      DC Associate Professor  CM consult      PAC Choice  IP REHAB   Choice offered to / List presented to:  C-3 Spouse           Status of service:  Completed, signed off Medicare Important Message given?   (If response is "NO", the following Medicare IM given date fields will be blank) Date Medicare IM given:   Date Additional Medicare IM given:    Discharge Disposition:    Per UR Regulation:  Reviewed for med. necessity/level of care/duration of stay  If discussed at Long Length of Stay Meetings, dates discussed:    Comments:  11/04/12 Maninder Deboer RN,BSN NCM WEEKEND [phone] FOR D/C TO INPT REHAB-AUTH RECEIVED,NOT MEDICALLY STABLE TODAY.[facility] STILL FOLLOWING IF DCP CHANGES TO HOME.

## 2012-11-02 NOTE — Progress Notes (Signed)
Called [facility] and requested an OT eval on patient; explained this would be needed for CIR to evaluate and for requesting insurance authorization for possible CIR.  [phone]

## 2012-11-02 NOTE — Evaluation (Signed)
Physical Therapy Evaluation Patient Details Name: Leonard [name] MRN: [id number] DOB: 1955/09/25 Today's Date: 11/02/2012 Time: 5784-6962 PT Time Calculation (min): 41 min  PT Assessment / Plan / Recommendation Clinical Impression  pt is s/p bil TKA s and will benefit from PT to maximize independence with functional mobility, will benefit form CIR post acute to allow return to active lifestyle.    PT Assessment  Patient needs continued PT services    Follow Up Recommendations  CIR    Does the patient have the potential to tolerate intense rehabilitation      Barriers to Discharge        Equipment Recommendations  Rolling walker with 5" wheels    Recommendations for Other Services     Frequency 7X/week    Precautions / Restrictions Precautions Required Braces or Orthoses: Knee Immobilizer - Right;Knee Immobilizer - Left Knee Immobilizer - Right: Discontinue once straight leg raise with < 10 degree lag Knee Immobilizer - Left: Discontinue once straight leg raise with < 10 degree lag Restrictions Weight Bearing Restrictions: No RLE Weight Bearing: Weight bearing as tolerated LLE Weight Bearing: Weight bearing as tolerated   Pertinent Vitals/Pain 1/10      Mobility  Bed Mobility Bed Mobility: Supine to Sit Supine to Sit: 1: +2 Total assist;HOB flat Supine to Sit: Patient Percentage: 60% Details for Bed Mobility Assistance: cues for hands and wt shift and bil LE management Transfers Transfers: Sit to Stand;Stand to Sit Sit to Stand: From bed;From elevated surface;1: +2 Total assist Sit to Stand: Patient Percentage: 60% Stand to Sit: To chair/3-in-1;1: +2 Total assist Stand to Sit: Patient Percentage: 60% Details for Transfer Assistance: cues for hand placement and LE management Ambulation/Gait Ambulation/Gait Assistance: 1: +2 Total assist Ambulation/Gait: Patient Percentage: 60% Ambulation Distance (Feet): 10 Feet Assistive device: Rolling  walker Ambulation/Gait Assistance Details: +2 for IVs, safety and balance; verbal cues for sequence, wt shift, assist to advance and place R LE due to numbness; heavy reliance on UEs to amb; bil KIs used this session Gait Pattern: Step-to pattern    Exercises Total Joint Exercises Ankle Circles/Pumps: AROM;Both;AAROM;10 reps Quad Sets: AROM;Both;10 reps Heel Slides: AAROM;Both;5 reps Goniometric ROM: RLE~90 degrees flexion; LLE ~85 degrees flexion; (epidural)   PT Diagnosis:    PT Problem List: Decreased strength;Decreased range of motion;Decreased activity tolerance;Decreased balance;Decreased mobility;Decreased knowledge of use of DME PT Treatment Interventions: DME instruction;Gait training;Functional mobility training;Therapeutic activities;Therapeutic exercise;Balance training;Patient/family education   PT Goals Acute Rehab PT Goals PT Goal Formulation: With patient Time For Goal Achievement: 11/11/12 Potential to Achieve Goals: Good Pt will go Supine/Side to Sit: with min assist PT Goal: Supine/Side to Sit - Progress: Goal set today Pt will go Sit to Supine/Side: with min assist PT Goal: Sit to Supine/Side - Progress: Goal set today Pt will go Sit to Stand: with mod assist PT Goal: Sit to Stand - Progress: Goal set today Pt will go Stand to Sit: with mod assist PT Goal: Stand to Sit - Progress: Goal set today Pt will Ambulate: with min assist;with rolling walker PT Goal: Ambulate - Progress: Goal set today Pt will Perform Home Exercise Program: with min assist PT Goal: Perform Home Exercise Program - Progress: Goal set today  Visit Information  Last PT Received On: 11/02/12 Assistance Needed: +2    Subjective Data  Subjective: I have no pain Patient Stated Goal: home CIR vs SNF   Prior Functioning  Home Living Lives With: Spouse Available Help at Discharge: Other (Comment) (wife  is a Administrator, Civil Service and works a lot) Type of Home: [employer] Layout: Two level;Able to live on  main level with bedroom/bathroom Home Adaptive Equipment: Crutches Additional Comments: pt very active Prior Function Level of Independence: Independent Able to Take Stairs?: Yes Driving: Yes Vocation: Full time employment 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;  Musician: No difficulties    Cognition  Cognition Arousal/Alertness: Awake/alert Behavior During Therapy: WFL for tasks assessed/performed Overall Cognitive Status: Within Functional Limits for tasks assessed    Extremity/Trunk Assessment Right Upper Extremity Assessment RUE ROM/Strength/Tone: [facility] for tasks assessed Left Upper Extremity Assessment LUE ROM/Strength/Tone: WFL for tasks assessed Right Lower Extremity Assessment RLE ROM/Strength/Tone: Deficits;Due to pain RLE ROM/Strength/Tone Deficits: ankle 2+/5, AAROM WFL; able to assist with SLR; able to assist with knee and hip AAROM; at least 2+/5 Left Lower Extremity Assessment LLE ROM/Strength/Tone: Deficits;Due to pain LLE ROM/Strength/Tone Deficits: ankle WFL; able to assist with SLR, knee and hip flexion; at least 3/5 LLE Sensation: Deficits LLE Sensation Deficits: epidural; numb to light touch   Balance Static Standing Balance Static Standing - Balance Support: Bilateral upper extremity supported Static Standing - Level of Assistance: 1: +2 Total assist (pt=75%)  End of Session PT - End of Session Equipment Utilized During Treatment: Gait belt;Right knee immobilizer;Left knee immobilizer Activity Tolerance: Patient tolerated treatment well Patient left: in chair;with call bell/phone within reach;with family/visitor present Nurse Communication: Mobility status CPM Left Knee CPM Left Knee: Off CPM Right Knee CPM Right Knee: Off  GP     [facility] 11/02/2012, 9:48 AM
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
## 2012-11-02 NOTE — Progress Notes (Signed)
ANTICOAGULATION CONSULT NOTE - Initial Consult  Pharmacy Consult for Warfarin Indication: VTE prophylaxis  No Known Allergies  Patient Measurements: Height: 6' (182.9 cm) Weight: 257 lb (116.574 kg) IBW/kg (Calculated) : 77.6   Vital Signs: Temp: 98.1 F (36.7 C) (05/08 0120) Temp src: Oral (05/08 0120) BP: 123/72 mmHg (05/08 0120) Pulse Rate: 64 (05/08 0120)  Labs: No results found for this basename: HGB, HCT, PLT, APTT, LABPROT, INR, HEPARINUNFRC, CREATININE, CKTOTAL, CKMB, TROPONINI,  in the last 72 hours  Estimated Creatinine Clearance: 101.6 ml/min (by C-G formula based on Cr of 1.07).   Medical History: Past Medical History  Diagnosis Date  . DM (diabetes mellitus)   . Other and unspecified hyperlipidemia   . Osteoarthritis   . Nephrolithiasis   . Anxiety and depression   . Normal nuclear stress test 03-2010  . BPH (benign prostatic hyperplasia)   . HTN (hypertension)     controlled  . OSA on CPAP     mild  . Anxiety   . Depression     mild  . Renal artery stenosis     "some due to ageing"  . Bleeding nose     Right nostril severe bleeding    Medications:  Scheduled:  . [COMPLETED] sodium chloride   Intravenous Once  . [COMPLETED] acetaminophen  1,000 mg Intravenous Once  . acetaminophen  1,000 mg Intravenous Q6H  . allopurinol  100 mg Oral QAC breakfast  . amLODipine  10 mg Oral QHS   And  . benazepril  40 mg Oral QHS  . atorvastatin  20 mg Oral QHS  . [COMPLETED]  ceFAZolin (ANCEF) IV  2 g Intravenous On Call to OR  . [COMPLETED]  ceFAZolin (ANCEF) IV  2 g Intravenous Q6H  . dexamethasone  10 mg Oral Daily   Or  . dexamethasone  10 mg Intravenous Daily  . docusate sodium  100 mg Oral BID  . hydrochlorothiazide  25 mg Oral QAC breakfast  . insulin aspart  0-15 Units Subcutaneous TID WC  . linagliptin  5 mg Oral QAC breakfast   And  . metFORMIN  500 mg Oral BID WC  . potassium citrate  10 mEq Oral BID  . scopolamine  1 patch Transdermal  Once  . [COMPLETED] sodium chloride  500 mL Intravenous Once  . tamsulosin  0.4 mg Oral QHS  . [DISCONTINUED] amLODipine-benazepril  1 capsule Oral QHS  . [DISCONTINUED] bupivacaine liposome  20 mL Infiltration Once  . [DISCONTINUED] scopolamine  1 patch Transdermal Once  . [DISCONTINUED] sitaGLIPtan-metformin  1 tablet Oral BID WC   Infusions:  . 0.9 % NaCl with KCl 20 mEq / L 100 mL/hr at 11/01/12 2103  . bupivacaine (MARCAINE, SENSORCAINE) epidural 12 mL/hr at 11/02/12 0113  . naLOXone [facility]) adult infusion for PRURITIS    . [DISCONTINUED] sodium chloride    . [DISCONTINUED] lactated ringers    . [DISCONTINUED] naLOXone (NARCAN) adult infusion for PRURITIS      Assessment: 57 yo s/p bilateral TKA- on epidural post-op- epidural to be removed 5/9 (Fri) per MD.  Warfarin per Rx for VTE prophylaxis.  Goal of Therapy:  INR 2-3    Plan:   Daily PT/INR  Education  F/u epidural removal plan, order Warfarin accordingly  Leonard [name] 11/02/2012,3:07 AM

## 2012-11-02 NOTE — Progress Notes (Signed)
Rehab Admissions Coordinator Note:  Patient was screened by Clois Dupes for appropriateness for an Inpatient Acute Rehab Consult. Dr. Wynn Banker to se pt today.  Clois Dupes 11/02/2012, 10:50 AM  I can be reached at [phone].
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
## 2012-11-02 NOTE — Progress Notes (Signed)
Request is being made to pt's insurance for CIR auth.  Admission to CIR depends on pt's continued need for & agreement to CIR, insurance auth for CIR & CIR bed availability. Will f/up in the a.m.  [phone]

## 2012-11-02 NOTE — Evaluation (Signed)
Occupational Therapy Evaluation Patient Details Name: Leonard [name] MRN: [id number] DOB: 25-Jan-1956 Today's Date: 11/02/2012 Time: 1308-6578 OT Time Calculation (min): 26 min  OT Assessment / Plan / Recommendation Clinical Impression  Pt is recovering from B TKA.  Highly motivated to return to independence.  Pt needs to be functioning at a modified independent level for return home and manage stairs.  Currently requires +2 assist for mobility and total assist for LB ADL.  Pt will benefit from intense rehab post acute hospitalization.    OT Assessment  Patient needs continued OT Services    Follow Up Recommendations  CIR    Barriers to Discharge Inaccessible home environment;Decreased caregiver support    Equipment Recommendations  3 in 1 bedside comode    Recommendations for Other Services Rehab consult  Frequency  Min 2X/week    Precautions / Restrictions Precautions Precautions: Knee;Fall Required Braces or Orthoses: Knee Immobilizer - Right;Knee Immobilizer - Left Knee Immobilizer - Right: Discontinue once straight leg raise with < 10 degree lag Knee Immobilizer - Left: Discontinue once straight leg raise with < 10 degree lag Restrictions Weight Bearing Restrictions: No RLE Weight Bearing: Weight bearing as tolerated LLE Weight Bearing: Weight bearing as tolerated   Pertinent Vitals/Pain Describes surgical pain B knees, did not rate, premedicated    ADL  Eating/Feeding: Independent Where Assessed - Eating/Feeding: Chair Grooming: Wash/dry hands;Wash/dry face;Set up Where Assessed - Grooming: Unsupported sitting Upper Body Bathing: Set up Where Assessed - Upper Body Bathing: Unsupported sitting Lower Body Bathing: +1 Total assistance Where Assessed - Lower Body Bathing: Unsupported sitting;Supported sit to stand Upper Body Dressing: Set up Where Assessed - Upper Body Dressing: Unsupported sitting Lower Body Dressing: +1 Total assistance Where Assessed - Lower  Body Dressing: Unsupported sitting;Supported sit to stand Equipment Used: Gait belt;Rolling walker;Long-handled shoe horn;Long-handled sponge;Reacher;Sock aid Transfers/Ambulation Related to ADLs: +2 total assist, pt 46%.  ADL Comments: Instructed pt in use of AE for LB ADL and in availability of 3 in 1.  Home shower is too small to accommodate a chair, pt will need to stand.  Bathrooms are narrow, pt not able to use walker within.    OT Diagnosis: Generalized weakness;Acute pain  OT Problem List: Decreased strength;Decreased range of motion;Impaired balance (sitting and/or standing);Decreased knowledge of use of DME or AE;Pain OT Treatment Interventions: Self-care/ADL training;DME and/or AE instruction;Therapeutic activities;Patient/family education   OT Goals Acute Rehab OT Goals OT Goal Formulation: With patient Time For Goal Achievement: 11/16/12 Potential to Achieve Goals: Good ADL Goals Pt Will Perform Grooming: with modified independence;Standing at sink ADL Goal: Grooming - Progress: Goal set today Pt Will Perform Lower Body Bathing: with modified independence;Sit to stand from bed;Unsupported ADL Goal: Lower Body Bathing - Progress: Goal set today Pt Will Perform Lower Body Dressing: with modified independence;Sit to stand from bed;with adaptive equipment ADL Goal: Lower Body Dressing - Progress: Goal set today Pt Will Transfer to Toilet: with modified independence;Ambulation;with DME ADL Goal: Toilet Transfer - Progress: Goal set today Pt Will Perform Toileting - Clothing Manipulation: with modified independence;Standing ADL Goal: Toileting - Clothing Manipulation - Progress: Goal set today Pt Will Perform Toileting - Hygiene: with modified independence;Sit to stand from 3-in-1/toilet ADL Goal: Toileting - Hygiene - Progress: Goal set today  Visit Information  Last OT Received On: 11/02/12 Assistance Needed: +2    Subjective Data  Subjective: "There is no way the walker  will fit in my bathroom." Patient Stated Goal: Rehab prior to return home. Return to  active lifestyle.   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 Home Adaptive Equipment: Crutches Additional Comments: pt very active Prior Function Level of Independence: Independent Able to Take Stairs?: Yes Driving: Yes Vocation: Full time employment Communication Communication: No difficulties Dominant Hand: Right         Vision/Perception Vision - History Baseline Vision: Wears glasses only for reading Patient Visual Report: No change from baseline   Cognition  Cognition Arousal/Alertness: Awake/alert Behavior During Therapy: WFL for tasks assessed/performed Overall Cognitive Status: Within Functional Limits for tasks assessed    Extremity/Trunk Assessment Right Upper Extremity Assessment RUE ROM/Strength/Tone: WFL for tasks assessed RUE Coordination: WFL - gross/fine motor Left Upper Extremity Assessment LUE ROM/Strength/Tone: WFL for tasks assessed LUE Coordination: WFL - gross/fine motor Trunk Assessment Trunk Assessment: Normal     Mobility Bed Mobility Bed Mobility: Not assessed, pt up in chair  Transfers Transfers: Sit to Stand;Stand to Sit Sit to Stand: From bed;From elevated surface;1: +2 Total assist Sit to Stand: Patient Percentage: 60% Stand to Sit: To chair/3-in-1;1: +2 Total assist Stand to Sit: Patient Percentage: 60% Details for Transfer Assistance: cues for hand  placement and LE management     Exercise   Balance   End of Session OT - End of Session Activity Tolerance: Patient tolerated treatment well Patient left: in chair;with call bell/phone within reach;with family/visitor present  GO     Evern Bio 11/02/2012, 1:33 PM [phone]

## 2012-11-03 LAB — PROTIME-INR
INR: 1.31 (ref 0.00–1.49)
Prothrombin Time: 16 seconds — ABNORMAL HIGH (ref 11.6–15.2)

## 2012-11-03 LAB — GLUCOSE, CAPILLARY
Glucose-Capillary: 150 mg/dL — ABNORMAL HIGH (ref 70–99)
Glucose-Capillary: 161 mg/dL — ABNORMAL HIGH (ref 70–99)

## 2012-11-03 LAB — CBC
HCT: 27.8 % — ABNORMAL LOW (ref 39.0–52.0)
Platelets: 172 10*3/uL (ref 150–400)
RDW: 13.2 % (ref 11.5–15.5)
WBC: 13.8 10*3/uL — ABNORMAL HIGH (ref 4.0–10.5)

## 2012-11-03 LAB — BASIC METABOLIC PANEL
BUN: 29 mg/dL — ABNORMAL HIGH (ref 6–23)
Chloride: 103 mEq/L (ref 96–112)
GFR calc Af Amer: >90 mL/min (ref >90)
Potassium: 4.4 mEq/L (ref 3.5–5.1)
Sodium: 137 mEq/L (ref 135–145)

## 2012-11-03 MED ORDER — WARFARIN SODIUM 7.5 MG PO TABS
7.5000 mg | ORAL_TABLET | Freq: Once | ORAL | Status: AC
  Administered 2012-11-03: 7.5 mg via ORAL
  Filled 2012-11-03: qty 1

## 2012-11-03 MED ORDER — ENOXAPARIN SODIUM 30 MG/0.3ML ~~LOC~~ SOLN
30.0000 mg | Freq: Two times a day (BID) | SUBCUTANEOUS | Status: DC
  Administered 2012-11-04 – 2012-11-06 (×5): 30 mg via SUBCUTANEOUS
  Filled 2012-11-03 (×6): qty 0.3

## 2012-11-03 MED ORDER — ENOXAPARIN SODIUM 30 MG/0.3ML ~~LOC~~ SOLN
30.0000 mg | Freq: Once | SUBCUTANEOUS | Status: AC
  Administered 2012-11-03: 30 mg via SUBCUTANEOUS
  Filled 2012-11-03: qty 0.3

## 2012-11-03 MED ORDER — SODIUM CHLORIDE 0.9 % IV BOLUS (SEPSIS)
250.0000 mL | Freq: Once | INTRAVENOUS | Status: AC
  Administered 2012-11-03: 250 mL via INTRAVENOUS

## 2012-11-03 NOTE — Progress Notes (Signed)
OT Cancellation Note  Patient Details Name: Leonard [name] MRN: [id number] DOB: 10-28-1955   Cancelled Treatment:    Reason Eval/Treat Not Completed: Other (comment) (per PT almost passed out earlier. Will check back for OT likely tomorrow.)  [name] [phone] 11/03/2012, 1:20 PM

## 2012-11-03 NOTE — Progress Notes (Signed)
Bed available CIR tomorrow 11/04/12-pt & wife agreeable w/ admit to CIR-pt's insurance has authorized coverage for admit tomorrow CIR.  Can admit pt to CIR tomorrow, if medically stable.  [phone]

## 2012-11-03 NOTE — Progress Notes (Signed)
Physical Therapy Treatment Patient Details Name: Leonard [name] MRN: [id number] DOB: Mar 31, 1956 Today's Date: 11/03/2012 Time: 0832-0910 PT Time Calculation (min): 38 min  PT Assessment / Plan / Recommendation Comments on Treatment Session  pt with near LOC after seated in chair; pt placed into full recline and performed deep breathing and ankle pumps on L; BP 102/64; HR 71; Sats 97% on RA; RN notified    Follow Up Recommendations  CIR     Does the patient have the potential to tolerate intense rehabilitation     Barriers to Discharge        Equipment Recommendations  Rolling walker with 5" wheels    Recommendations for Other Services    Frequency 7X/week   Plan Discharge plan remains appropriate;Frequency remains appropriate    Precautions / Restrictions Precautions Precautions: Knee;Fall Required Braces or Orthoses: Knee Immobilizer - Right;Knee Immobilizer - Left Knee Immobilizer - Right: Discontinue once straight leg raise with < 10 degree lag Knee Immobilizer - Left: Discontinue once straight leg raise with < 10 degree lag Restrictions Weight Bearing Restrictions: No RLE Weight Bearing: Weight bearing as tolerated LLE Weight Bearing: Weight bearing as tolerated   Pertinent Vitals/Pain 1/10 Lknee with flexion    Mobility  Bed Mobility Bed Mobility: Sit to Supine Supine to Sit: 1: +2 Total assist;HOB flat Supine to Sit: Patient Percentage: 70% Details for Bed Mobility Assistance: cues for hands and wt shift and bil LE management, assist wtih bil LEs Transfers Transfers: Sit to Stand;Stand to Sit;Stand Pivot Transfers Sit to Stand: 1: +2 Total assist;With upper extremity assist;From bed;From elevated surface Sit to Stand: Patient Percentage: 60% Stand to Sit: 1: +2 Total assist;To bed;To elevated surface;With upper extremity assist Stand to Sit: Patient Percentage: 50% Stand Pivot Transfers: 1: +2 Total assist Stand Pivot Transfers: Patient Percentage:  50% Details for Transfer Assistance: cues for hand placement and wt shift; pt continues to require +2 for safety, balance and IV/chair; Pt requiring multi-modal cues and assist to advance RLE and control/protect Rankle/foot due to foot drop, RLE sensation continues to be decreased due to epidural Ambulation/Gait Ambulation/Gait Assistance: Not tested (comment) (due to epidural/medical issues)    Exercises Total Joint Exercises Ankle Circles/Pumps: PROM;AROM;Both;10 reps Quad Sets: AROM;Both;10 reps Heel Slides: AAROM;Both;10 reps Goniometric ROM: approx 80 degrees LLE; 90 degrees RLE   PT Diagnosis:    PT Problem List:   PT Treatment Interventions:     PT Goals Acute Rehab PT Goals Time For Goal Achievement: 11/11/12 Potential to Achieve Goals: Good Pt will go Supine/Side to Sit: with min assist PT Goal: Supine/Side to Sit - Progress: Progressing toward goal Pt will go Sit to Supine/Side: with min assist Pt will go Sit to Stand: with mod assist PT Goal: Sit to Stand - Progress: Progressing toward goal Pt will go Stand to Sit: with mod assist PT Goal: Stand to Sit - Progress: Progressing toward goal Pt will Ambulate: with min assist;with rolling walker;51 - 150 feet PT Goal: Ambulate - Progress: Not progressing (due to epidural) Pt will Perform Home Exercise Program: with min assist PT Goal: Perform Home Exercise Program - Progress: Progressing toward goal  Visit Information  Last PT Received On: 11/03/12 Assistance Needed: +2    Subjective Data  Subjective: I feel good Patient Stated Goal: home  after CIR vs SNF   Cognition  Cognition Arousal/Alertness: Awake/alert Behavior During Therapy: WFL for tasks assessed/performed Overall Cognitive Status: Within Functional Limits for tasks assessed    Balance  End of Session PT - End of Session Equipment Utilized During Treatment: Gait belt;Right knee immobilizer;Left knee immobilizer Activity Tolerance: Treatment limited  secondary to medication (epidural) Patient left: in chair;with call bell/phone within reach Nurse Communication: Mobility status;Other (comment) CPM Left Knee CPM Left Knee: Off CPM Right Knee CPM Right Knee: Off   GP     [facility] 11/03/2012, 9:20 AM

## 2012-11-03 NOTE — Progress Notes (Signed)
11/03/12 1300  PT Visit Information  Last PT Received On 11/03/12  Assistance Needed +2  PT Time Calculation  PT Start Time 1308  PT Stop Time 1324  PT Time Calculation (min) 16 min  Precautions  Precautions Knee;Fall  Required Braces or Orthoses Knee Immobilizer - Right;Knee Immobilizer - Left  Knee Immobilizer - Right Discontinue once straight leg raise with < 10 degree lag  Knee Immobilizer - Left Discontinue once straight leg raise with < 10 degree lag  Restrictions  RLE Weight Bearing WBAT  LLE Weight Bearing WBAT  Bed Mobility  Bed Mobility Not assessed  Transfers  Transfers Sit to Stand;Stand to Sit;Stand Pivot Transfers  Sit to Stand 1: +2 Total assist;With upper extremity assist;From elevated surface;From chair/3-in-1  Sit to Stand: Patient Percentage 70%  Stand to Sit 1: +2 Total assist;To chair/3-in-1  Stand to Sit: Patient Percentage 60%  Ambulation/Gait  Ambulation/Gait Assistance 1: +2 Total assist  Ambulation/Gait: Patient Percentage 60%  Ambulation Distance (Feet) 14 Feet  Assistive device Rolling walker  Ambulation/Gait Assistance Details +2 for safety, balance and chair; pt became dizzy, char to pt; no LOC; RN present and charted vitals  Gait Pattern Step-to pattern  General Gait Details heavy use of UEs although able to bear more wt and advance RLE this pm (epidural out)  Total Joint Exercises  Ankle Circles/Pumps AROM;Both;10 reps  PT - End of Session  Equipment Utilized During Treatment Gait belt;Right knee immobilizer;Left knee immobilizer  Activity Tolerance Patient limited by fatigue;Treatment limited secondary to medical complications (Comment) (decreased BP)  Patient left in chair;with call bell/phone within reach;with family/visitor present;with nursing in room  Nurse Communication Mobility status  PT - Assessment/Plan  Comments on Treatment Session progressing  PT Plan Discharge plan remains appropriate;Frequency remains appropriate  PT  Frequency 7X/week  Follow Up Recommendations CIR  PT equipment Rolling walker with 5" wheels  PT General Charges  $$ ACUTE PT VISIT 1 Procedure  PT Treatments  $Therapeutic Activity 8-22 mins

## 2012-11-03 NOTE — PMR Pre-admission (Addendum)
PMR Admission Coordinator Pre-Admission Assessment  Patient: Leonard [name] is an 57 y.o., male MRN: [id number] DOB: [birth year] Height: 6' (182.9 cm) Weight: 116.574 kg (257 lb)              Insurance Information HMO:    PPO: yes     PCP:       IPA:       80/20:       OTHER:   PRIMARY: [place] BCBS      Policy#: [id number]      Subscriber: pt CM Name: Carolan Clines      Phone#: [phone]     Fax#: [phone] Pre-Cert#: [id number] Update due on 11/13/12 if patient does not go home on 11/14/12     Employer: [employer] Benefits:  Phone #: [phone]     Name: Ty Eff. 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 male with history of HTN, DM, bilateral knee pain due to OA with significant varus deformity and failure of conservative therapy. Patient elected to undergo B-TKR on 11/01/12 by Dr. Lequita Halt. Post op with hypotension requiring IVF and complaints of numbness RLE. Therapy evaluations done today and recommending CIR.  Update:  Patient had planned to admit to acute inpatient rehab on 05/10.  BP was soft, patient was dizzy, bolus of fluid was given.  Patient was held on acute until 05/12.  Now ready for inpatient rehab.     Past Medical History  Past Medical History  Diagnosis Date  . DM (diabetes mellitus)   . Other and unspecified hyperlipidemia   . Osteoarthritis   . Nephrolithiasis   . Anxiety and  depression   . Normal nuclear stress test 03-2010  . BPH (benign prostatic hyperplasia)   . HTN (hypertension)     controlled  . OSA on CPAP     mild  . Anxiety   . Depression     mild  . Renal artery stenosis     "some due to ageing"  . Bleeding nose     Right nostril severe bleeding    Family History  family history includes Bipolar disorder in his daughter, mother, and sister; Diabetes in his other; Heart failure in his father; Pneumonia in his mother; and Prostate cancer in his father and other.  There is no history of Colon cancer.  Prior Rehab/Hospitalizations: no   Current Medications  Current facility-administered medications:0.9 % NaCl with KCl 20 mEq/ L  infusion, , Intravenous, Continuous, [name], MD, Last Rate: 20 mL/hr at 11/03/12 0741;  acetaminophen (TYLENOL) suppository 650 mg, 650 mg, Rectal, Q6H PRN, [name], MD;  acetaminophen (TYLENOL) tablet 650 mg, 650 mg, Oral, Q6H PRN, [name], MD allopurinol (ZYLOPRIM) tablet 100 mg, 100 mg, Oral, QAC breakfast, [name], MD,  100 mg at 11/03/12 0739;  amLODipine (NORVASC) tablet 10 mg, 10 mg, Oral, QHS, Alexzandrew Perkins, PA-C, 10 mg at 11/02/12 2205;  atorvastatin (LIPITOR) tablet 20 mg, 20 mg, Oral, QHS, [name], MD, 20 mg at 11/02/12 2204;  benazepril (LOTENSIN) tablet 40 mg, 40 mg, Oral, QHS, Alexzandrew Perkins, PA-C, 40 mg at 11/02/12 2205 bisacodyl (DULCOLAX) suppository 10 mg, 10 mg, Rectal, Daily PRN, [name], MD;  bupivacaine (SENSORCAINE-MPF) 0.125 % in sodium chloride 0.9 % 150 mL epidural, , Epidural, Continuous, Azell Der, MD, Last Rate: 12 mL/hr at 11/03/12 0430;  clonazePAM (KLONOPIN) tablet 0.25 mg, 0.25 mg, Oral, BID PRN, [name], MD;  diphenhydrAMINE (BENADRYL) 12.5 MG/5ML elixir 12.5-25 mg, 12.5-25 mg, Oral, Q4H PRN, [name], MD diphenhydrAMINE (BENADRYL) capsule 25 mg, 25 mg, Oral, Q4H PRN, Azell Der, MD;  diphenhydrAMINE (BENADRYL)  injection 12.5 mg, 12.5 mg, Intravenous, Q4H PRN, Azell Der, MD;  diphenhydrAMINE (BENADRYL) injection 25 mg, 25 mg, Intramuscular, Q4H PRN, Azell Der, MD;  docusate sodium (COLACE) capsule 100 mg, 100 mg, Oral, BID, [name], MD, 100 mg at 11/03/12 1016 enoxaparin (LOVENOX) injection 30 mg, 30 mg, Subcutaneous, Once, [name], MD;  Melene Muller ON 11/04/2012] enoxaparin (LOVENOX) injection 30 mg, 30 mg, Subcutaneous, Q12H, Loma Messing Borgerding, RPH;  glipiZIDE (GLUCOTROL) tablet 5 mg, 5 mg, Oral, Daily PRN, [name], MD;  hydrochlorothiazide (HYDRODIURIL) tablet 25 mg, 25 mg, Oral, QAC breakfast, Alexzandrew Perkins, PA-C, 25 mg at 11/03/12 0739 insulin aspart (novoLOG) injection 0-15 Units, 0-15 Units, Subcutaneous, TID WC, [name], MD, 2 Units at 11/03/12 1306;  linagliptin (TRADJENTA) tablet 5 mg, 5 mg, Oral, QAC breakfast, [name], MD, 5 mg at 11/03/12 0739;  menthol-cetylpyridinium (CEPACOL) lozenge 3 mg, 1 lozenge, Oral, PRN, [name], MD;  metFORMIN (GLUCOPHAGE) tablet 500 mg, 500 mg, Oral, BID WC, [name], MD, 500 mg at 11/03/12 1610 methocarbamol (ROBAXIN) 500 mg in dextrose 5 % 50 mL IVPB, 500 mg, Intravenous, Q6H PRN, [name], MD;  methocarbamol (ROBAXIN) tablet 500 mg, 500 mg, Oral, Q6H PRN, [name], MD, 500 mg at 11/03/12 1356;  metoCLOPramide (REGLAN) injection 5-10 mg, 5-10 mg, Intravenous, Q8H PRN, [name], MD;  metoCLOPramide (REGLAN) tablet 5-10 mg, 5-10 mg, Oral, Q8H PRN, [name], MD morphine 2 MG/ML injection 1-2 mg, 1-2 mg, Intravenous, Q1H PRN, [name], MD;  nalbuphine (NUBAIN) injection 5-10 mg, 5-10 mg, Subcutaneous, Q4H PRN, Azell Der, MD;  nalbuphine (NUBAIN) injection 5-10 mg, 5-10 mg, Intravenous, Q4H PRN, Azell Der, MD;  naloxone (NARCAN) 2 mg in dextrose 5 % 250 mL infusion, 1-4 mcg/kg/hr, Intravenous, Continuous PRN, Azell Der, MD naloxone [facility]) injection 0.4  mg, 0.4 mg, Intravenous, PRN, Azell Der, MD;  ondansetron (ZOFRAN) injection 4 mg, 4 mg, Intravenous, Q6H PRN, [name], MD;  ondansetron (ZOFRAN) tablet 4 mg, 4 mg, Oral, Q6H PRN, [name], MD;  oxyCODONE (Oxy IR/ROXICODONE) immediate release tablet 5-20 mg, 5-20 mg, Oral, Q3H PRN, [name], MD, 20 mg at 11/03/12 1356 phenol (CHLORASEPTIC) mouth spray 1 spray, 1 spray, Mouth/Throat, PRN, [name], MD;  polyethylene glycol (MIRALAX / GLYCOLAX) packet 17 g, 17 g, Oral, Daily PRN, [name], MD;  potassium citrate (UROCIT-K) SR tablet 10 mEq, 10 mEq, Oral, BID, [name], MD, 10 mEq at 11/03/12 1016;  scopolamine (TRANSDERM-SCOP) 1.5 MG 1.5 mg, 1  patch, Transdermal, Once, Azell Der, MD sodium chloride 0.9 % injection 3 mL, 3 mL, Intravenous, PRN, Azell Der, MD;  tamsulosin [facility]) capsule 0.4 mg, 0.4 mg, Oral, QHS, [name], MD, 0.4 mg at 11/02/12 2204;  traMADol (ULTRAM) tablet 50-100 mg, 50-100 mg, Oral, Q6H PRN, [name], MD;  warfarin (COUMADIN) tablet 7.5 mg, 7.5 mg, Oral, ONCE-1800, Loma Messing Borgerding, [facility] Warfarin - Pharmacist Dosing Inpatient, , Does not apply, q1800, Loma Messing Borgerding, RPH  Patients Current Diet: Carb Control  Precautions / Restrictions Precautions Precautions: Knee;Fall Restrictions Weight Bearing Restrictions: No RLE Weight Bearing: Weight bearing as tolerated LLE Weight Bearing: Weight bearing as tolerated   Prior Activity Level Community (5-7x/wk): active daily  Journalist, newspaper / Equipment Home Assistive Devices/Equipment: Eyeglasses Home Adaptive Equipment: Crutches  Prior Functional Level Prior Function Level of Independence: Independent Able to Take Stairs?: Yes Driving: Yes Vocation: Full time employment 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;   Current Functional Level Cognition  Arousal/Alertness: Awake/alert Overall Cognitive Status: Within Functional Limits for tasks  assessed Orientation Level: Oriented X4    Extremity Assessment (includes Sensation/Coordination)  RUE ROM/Strength/Tone: WFL for tasks assessed RUE Coordination: WFL - gross/fine motor  RLE ROM/Strength/Tone: Deficits;Due to pain RLE ROM/Strength/Tone Deficits: ankle 2+/5, AAROM WFL; able to assist with SLR; able to assist with knee and hip AAROM; at least 2+/5    ADLs  Eating/Feeding: Independent Where Assessed - Eating/Feeding: Chair Grooming: Wash/dry hands;Wash/dry face;Set up Where Assessed - Grooming: Unsupported sitting Upper Body Bathing: Set up Where Assessed - Upper Body Bathing: Unsupported sitting Lower Body Bathing: +1 Total assistance Where Assessed - Lower Body Bathing: Unsupported sitting;Supported sit to stand Upper Body Dressing: Set up Where Assessed - Upper Body Dressing: Unsupported sitting Lower Body Dressing: +1 Total assistance Where Assessed - Lower Body Dressing: Unsupported sitting;Supported sit to stand Equipment Used: Gait belt;Rolling walker;Long-handled shoe horn;Long-handled sponge;Reacher;Sock aid Transfers/Ambulation Related to ADLs: +2 total assist, pt 16%.  ADL Comments: Instructed pt in use of AE for LB ADL and in availability of 3 in 1.  Home shower is too small to accommodate a chair, pt will need to stand.  Bathrooms are narrow, pt not able to use walker within.    Mobility  Bed Mobility: Sit to Supine Supine to Sit: 1: +2 Total assist;HOB flat Supine to Sit: Patient Percentage: 70% Sit to Supine: 3: Mod assist    Transfers  Transfers: Sit to Stand;Stand to Sit;Stand Pivot Transfers Sit to Stand: 3: Mod assist;From chair/3-in-1;With upper extremity assist Sit to Stand: Patient Percentage: 70% Stand to Sit: 3: Mod assist;To bed;To elevated surface;With upper extremity assist Stand to Sit: Patient Percentage: 60% Stand Pivot Transfers: 3: Mod assist Stand Pivot Transfers: Patient Percentage: 50%    Ambulation / Gait / Stairs /  Wheelchair Mobility  Ambulation/Gait Ambulation/Gait Assistance: 1: +2 Total assist Ambulation/Gait: Patient Percentage: 60% Ambulation Distance (Feet): 14 Feet Assistive device: Rolling walker Ambulation/Gait Assistance Details: +2 for safety, balance and chair; pt became dizzy, char to pt; no LOC; RN present and charted vitals Gait Pattern: Step-to pattern General Gait Details: heavy use of UEs although able to bear more wt and advance RLE this pm (epidural out)    Posture / Balance Static Standing Balance Static Standing - Balance Support: Bilateral upper extremity supported Static Standing - Level of Assistance: 1: +2 Total assist (pt=75%)    Special needs/care consideration  CPM: yes Skin: B. Incisions-knees Bowel mgmt: LBM 10/31/12 Bladder mgmt: Foley to  be removed @ 6pm 11/03/12 Diabetic mgmt: yes     Previous Home Environment Living Arrangements: Spouse/significant other Lives With: Spouse Available Help at Discharge: Family;Available PRN/intermittently (wife works full time) Type of Home: House Home Layout: Able to live on main level with bedroom/bathroom;Multi-level Alternate Level Stairs-Number of Steps: multilevel plus 1 flight Home Access: Stairs to enter Entrance Stairs-Rails: Right Entrance Stairs-Number of Steps: 6 Bathroom Shower/Tub: Walk-in shower (and garden tub, walk-in has large wall to step over)There is a more usable shower upstairs.  He is agreeable to sponge bathe till he can go upstairs. Bathroom Toilet: Pharmacist, community: No Home Care Services: No Additional Comments: pt very active  Discharge Living Setting Plans for Discharge Living Setting: Patient's home Do you have any problems obtaining your medications?: No  Social/Family/Support Systems Patient Roles: Spouse Contact Information: pt's cell# [phone] Anticipated Caregiver: wife Anticipated Caregiver's Contact Information: wife's cell# [phone] Ability/Limitations of  Caregiver: S-light Min A Caregiver Availability: Intermittent (would arrange for more if necessary) Discharge Plan Discussed with Primary Caregiver: Yes Is Caregiver In Agreement with Plan?: Yes Does Caregiver/Family have Issues with Lodging/Transportation while Pt is in Rehab?: No   Goals/Additional Needs Patient/Family Goal for Rehab: Mod I Expected length of stay: 5-7 days Pt/Family Agrees to Admission and willing to participate: Yes Program Orientation Provided & Reviewed with Pt/Caregiver Including Roles  & Responsibilities: Yes   Decrease burden of Care through IP rehab admission: Decrease number of caregivers and Patient/family education   Possible need for SNF placement upon discharge: no   Patient Condition:  This patient's medical and functional status has changed since the consult dated 11/02/12 in which the rehabilitation physician determined and documented that the patient's condition is appropriate for intensive rehabilitative care in an inpatient rehabilitation facility.  Currently patient requiring total assist +2 doing 70% of the work and ambulating 28 ft with RW.  See update above.  Patient's medical and functional status update has been discussed with the Rehabilitation physician and patient is still appropriate for intensive rehabilitative care in an inpatient rehabilitation facility.  Will admit to inpatient rehab today.  Preadmission Screen Completed By:  Brock Ra, 11/03/2012 3:34 PM with update by Roderic Palau on 11/06/12 at 0830. ______________________________________________________________________   Discussed status with Dr. Riley Kill on 11/06/12 at 0831 and received telephone approval for admission today 11/06/12.  Admission Coordinator:  Brock Ra, time 4:04pm/Date 11/03/12 with update by Roderic Palau on 11/06/12 at 0830.

## 2012-11-03 NOTE — Progress Notes (Signed)
11/03/12 1352  PT Visit Information  Last PT Received On 11/03/12  Assistance Needed +2  PT Time Calculation  PT Start Time 1333  PT Stop Time 1352  PT Time Calculation (min) 19 min  Precautions  Precautions Knee;Fall  Required Braces or Orthoses Knee Immobilizer - Right;Knee Immobilizer - Left  Knee Immobilizer - Right Discontinue once straight leg raise with < 10 degree lag  Knee Immobilizer - Left Discontinue once straight leg raise with < 10 degree lag  Restrictions  RLE Weight Bearing WBAT  LLE Weight Bearing WBAT  Cognition  Arousal/Alertness Awake/alert  Behavior During Therapy WFL for tasks assessed/performed  Overall Cognitive Status Within Functional Limits for tasks assessed  Bed Mobility  Bed Mobility Sit to Supine  Sit to Supine 3: Mod assist  Details for Bed Mobility Assistance cues for hands and wt shift and bil LE management, assist wtih bil LEs  Transfers  Transfers Sit to Stand;Stand to Sit;Stand Pivot Transfers  Sit to Stand 3: Mod assist;From chair/3-in-1;With upper extremity assist  Stand to Sit 3: Mod assist;To bed;To elevated surface;With upper extremity assist  Stand Pivot Transfers 3: Mod assist  Details for Transfer Assistance cues for hand placement and LE management  Total Joint Exercises  Straight Leg Raises AAROM;Both;10 reps  PT - End of Session  Equipment Utilized During Treatment Gait belt;Right knee immobilizer;Left knee immobilizer  Activity Tolerance Patient tolerated treatment well  Patient left in bed;with call bell/phone within reach;with family/visitor present  PT - Assessment/Plan  Comments on Treatment Session progressing  PT Plan Discharge plan remains appropriate;Frequency remains appropriate  PT Frequency 7X/week  Follow Up Recommendations CIR  PT equipment Rolling walker with 5" wheels  Acute Rehab PT Goals  Time For Goal Achievement 11/11/12  Pt will go Sit to Supine/Side with min assist  PT Goal: Sit to Supine/Side -  Progress Progressing toward goal  Pt will go Sit to Stand with mod assist  PT Goal: Sit to Stand - Progress Progressing toward goal  Pt will go Stand to Sit with mod assist  PT Goal: Stand to Sit - Progress Progressing toward goal  PT General Charges  $$ ACUTE PT VISIT 1 Procedure  PT Treatments  $Therapeutic Activity 8-22 mins

## 2012-11-03 NOTE — Progress Notes (Signed)
Discontinued epidural catheter.  Tip intact, site looks good.

## 2012-11-03 NOTE — Progress Notes (Signed)
Subjective: 2 Days Post-Op Procedure(s) (LRB): TOTAL KNEE BILATERAL (Bilateral) Patient reports excellent pain control with the epidural.   Patient seen in rounds for Dr. Lequita Halt. Patient is well, and has had no acute complaints or problems Epidural to come out today.  Anticipate possible increase in pain temporarily after the epidural has been removed.  Discussed with patient and he understands.  Will start PO pain meds prior to removal of catheter.  His right leg is more numb and weak today, likely due to the epidural placement and concentration.  Will monitor following removal. Plan is to go Rehab after hospital stay.  Objective: Vital signs in last 24 hours: Temp:  [97.8 F (36.6 C)-98 F (36.7 C)] 97.9 F (36.6 C) (05/09 0654) Pulse Rate:  [69-84] 84 (05/09 0654) Resp:  [14-19] 16 (05/09 0654) BP: (111-145)/(68-80) 111/72 mmHg (05/09 0654) SpO2:  [96 %-98 %] 96 % (05/09 0654)  Intake/Output from previous day:  Intake/Output Summary (Last 24 hours) at 11/03/12 1029 Last data filed at 11/03/12 0700  Gross per 24 hour  Intake   3932 ml  Output   4950 ml  Net  -1018 ml     Labs:  Recent Labs  11/02/12 0511 11/03/12 0510  HGB 11.3* 9.6*    Recent Labs  11/02/12 0511 11/03/12 0510  WBC 13.8* 13.8*  RBC 3.96* 3.33*  HCT 32.9* 27.8*  PLT 189 172    Recent Labs  11/02/12 0511 11/03/12 0510  NA 134* 137  K 4.0 4.4  CL 100 103  BUN 22 29*  CREATININE 0.90 1.00  GLUCOSE 211* 160*  CALCIUM 8.4 8.4    Recent Labs  11/02/12 0511 11/03/12 0510  INR 1.34 1.31    EXAM General - Patient is Alert, Appropriate and Oriented Extremity - Sensation is reduce to the right leg on the leg and foot.  Foot drop on the right foot which is likely due to the epidural.  Good sensation and motor function to the left leg. Dressing/Incision - clean, dry, no drainage, healing to both incisions Motor Function - intact, moving foot and toes well on the left leg but weakness  and foot drop on the right.  (Please note that the patient had good motor function and good dorsiflexion on the right foot yesterday)   Past Medical History  Diagnosis Date  . DM (diabetes mellitus)   . Other and unspecified hyperlipidemia   . Osteoarthritis   . Nephrolithiasis   . Anxiety and depression   . Normal nuclear stress test 03-2010  . BPH (benign prostatic hyperplasia)   . HTN (hypertension)     controlled  . OSA on CPAP     mild  . Anxiety   . Depression     mild  . Renal artery stenosis     "some due to ageing"  . Bleeding nose     Right nostril severe bleeding    Assessment/Plan: 2 Days Post-Op Procedure(s) (LRB): TOTAL KNEE BILATERAL (Bilateral) Principal Problem:   OA (osteoarthritis) of knee Active Problems:   Postop Hyponatremia  Estimated body mass index is 34.85 kg/(m^2) as calculated from the following:   Height as of this encounter: 6' (1.829 m).   Weight as of this encounter: 116.574 kg (257 lb). Up with therapy Continue foley due to urinary output monitoring and she still has her epidural in place.  Will remove the catheter six hours after the epidural is removed.  Will need to note in chart when the epidural  is pulled.  DVT Prophylaxis - Lovenox and Coumadin, Lovenox will not start until later this evening though after the epidural has been removed for twelve hours.  Will need to note in chart when the epidural is pulled. Anticipate possible increase in pain temporarily after the epidural has been removed.  Plan for [facility] following the hospital stay.  Weight-Bearing as tolerated to both legs  Take Coumadin for four weeks and then discontinue.  The dose may need to be adjusted based upon the INR.  Please follow the INR and titrate Coumadin dose for a therapeutic range between 2.0 and 3.0 INR.  After completing the four weeks of Coumadin, the patient may stop the Coumadin and then start on  81 mg Aspirin daily.  Lovenox injections  will start this evening after the epidural has been removed and continue until the INR is therapeutic at or greater than 2.0.  When INR reaches the therapeutic level of equal to or greater than 2.0, the patient may discontinue the Lovenox injections.  Yana Schorr 11/03/2012, 10:29 AM

## 2012-11-03 NOTE — Progress Notes (Signed)
ANTICOAGULATION CONSULT NOTE - Initial Consult  Pharmacy Consult for Warfarin Indication: VTE prophylaxis  No Known Allergies  Patient Measurements: Height: 6' (182.9 cm) Weight: 257 lb (116.574 kg) IBW/kg (Calculated) : 77.6   Vital Signs: Temp: 97.9 F (36.6 C) (05/09 0654) Temp src: Oral (05/08 2200) BP: 111/72 mmHg (05/09 0654) Pulse Rate: 84 (05/09 0654)  Labs:  Recent Labs  11/02/12 0511 11/03/12 0510  HGB 11.3* 9.6*  HCT 32.9* 27.8*  PLT 189 172  LABPROT 16.3* 16.0*  INR 1.34 1.31  CREATININE 0.90 1.00    Estimated Creatinine Clearance: 108.7 ml/min (by C-G formula based on Cr of 1).   Medical History: Past Medical History  Diagnosis Date  . DM (diabetes mellitus)   . Other and unspecified hyperlipidemia   . Osteoarthritis   . Nephrolithiasis   . Anxiety and depression   . Normal nuclear stress test 03-2010  . BPH (benign prostatic hyperplasia)   . HTN (hypertension)     controlled  . OSA on CPAP     mild  . Anxiety   . Depression     mild  . Renal artery stenosis     "some due to ageing"  . Bleeding nose     Right nostril severe bleeding    Medications:  Scheduled:  . [COMPLETED] acetaminophen  1,000 mg Intravenous Q6H  . allopurinol  100 mg Oral QAC breakfast  . amLODipine  10 mg Oral QHS   And  . benazepril  40 mg Oral QHS  . atorvastatin  20 mg Oral QHS  . [COMPLETED] coumadin book   Does not apply Once  . [COMPLETED] dexamethasone  10 mg Oral Daily   Or  . [COMPLETED] dexamethasone  10 mg Intravenous Daily  . docusate sodium  100 mg Oral BID  . hydrochlorothiazide  25 mg Oral QAC breakfast  . insulin aspart  0-15 Units Subcutaneous TID WC  . linagliptin  5 mg Oral QAC breakfast   And  . metFORMIN  500 mg Oral BID WC  . potassium citrate  10 mEq Oral BID  . scopolamine  1 patch Transdermal Once  . tamsulosin  0.4 mg Oral QHS  . [COMPLETED] warfarin  5 mg Oral ONCE-1800  . [COMPLETED] warfarin   Does not apply Once  .  Warfarin - Pharmacist Dosing Inpatient   Does not apply q1800   Infusions:  . 0.9 % NaCl with KCl 20 mEq / L 20 mL/hr at 11/03/12 0741  . bupivacaine (MARCAINE, SENSORCAINE) epidural 12 mL/hr at 11/03/12 0430  . naLOXone [facility]) adult infusion for PRURITIS      Assessment:  57 yo s/p bilateral TKA on 5/7 on with post-op epidural in place.  VTE prophylaxis initiated on 5/8 with low dose warfarin while epidural in place.  Plan is to remove epidural this morning 5/9.  Lovenox to start 12 hours after epidural is pulled.  Will give usual starting warfarin dose tonight after epidural pulled   CBC okay, no bleeding/complications   Goal of Therapy:  INR 2-3    Plan:   Warfarin 7.5 mg po  X 1 tonight at 1800 once epidural is pulled.  Please call pharmacy if epidural is not pulled today.   Will f/u with timing of epidural removal and timing of Lovenox initiation  Daily PT/INR  Monitor CBC and renal function   F/u patient education   Clydene Fake PharmD Pager #: [phone] 8:58 AM 11/03/2012

## 2012-11-03 NOTE — Progress Notes (Signed)
Pt seen and examined. Pt denies any pain currently. Some back discomfort intermittently seeming to be associated with CPM machine. Pt able to lift both legs to gravity. Ambulated with assistance in room per pt. today. No complaints. Will follow up tomorrow for likely epidural removal.

## 2012-11-04 LAB — BASIC METABOLIC PANEL
BUN: 20 mg/dL (ref 6–23)
Chloride: 94 mEq/L — ABNORMAL LOW (ref 96–112)
GFR calc Af Amer: >90 mL/min (ref >90)
Glucose, Bld: 190 mg/dL — ABNORMAL HIGH (ref 70–99)
Potassium: 3.8 mEq/L (ref 3.5–5.1)

## 2012-11-04 LAB — CBC
HCT: 27.1 % — ABNORMAL LOW (ref 39.0–52.0)
Hemoglobin: 9.7 g/dL — ABNORMAL LOW (ref 13.0–17.0)
WBC: 12.4 10*3/uL — ABNORMAL HIGH (ref 4.0–10.5)

## 2012-11-04 LAB — GLUCOSE, CAPILLARY
Glucose-Capillary: 175 mg/dL — ABNORMAL HIGH (ref 70–99)
Glucose-Capillary: 179 mg/dL — ABNORMAL HIGH (ref 70–99)
Glucose-Capillary: 220 mg/dL — ABNORMAL HIGH (ref 70–99)

## 2012-11-04 LAB — PROTIME-INR: INR: 1.19 (ref 0.00–1.49)

## 2012-11-04 MED ORDER — METHOCARBAMOL 500 MG PO TABS: 500.0000 mg | ORAL_TABLET | Freq: Four times a day (QID) | ORAL | Status: DC | PRN

## 2012-11-04 MED ORDER — SODIUM CHLORIDE 0.9 % IV BOLUS (SEPSIS)
250.0000 mL | Freq: Once | INTRAVENOUS | Status: AC
  Administered 2012-11-04: 250 mL via INTRAVENOUS

## 2012-11-04 MED ORDER — TRAMADOL HCL 50 MG PO TABS: 50.0000 mg | ORAL_TABLET | Freq: Four times a day (QID) | ORAL | Status: DC | PRN

## 2012-11-04 MED ORDER — OXYCODONE HCL 5 MG PO TABS: 5.0000 mg | ORAL_TABLET | ORAL | Status: DC | PRN

## 2012-11-04 MED ORDER — WARFARIN SODIUM 7.5 MG PO TABS
7.5000 mg | ORAL_TABLET | Freq: Once | ORAL | Status: AC
  Administered 2012-11-04: 7.5 mg via ORAL
  Filled 2012-11-04: qty 1

## 2012-11-04 NOTE — Progress Notes (Signed)
Subjective: 3 Days Post-Op Procedure(s) (LRB): TOTAL KNEE BILATERAL (Bilateral) Patient reports pain as mild.   Patient seen in rounds with Dr. Lequita Halt. Patient is well, and has had no acute complaints or problems Epidural came out yesterday.  He had some dizziness yesterday.  Will see how he does today.  If doing well, then transfer to CIR.  If not doing well, will keep over the weekend. Plan is to go Rehab after hospital stay.  Objective: Vital signs in last 24 hours: Temp:  [98.3 F (36.8 C)-98.8 F (37.1 C)] 98.8 F (37.1 C) (05/10 0654) Pulse Rate:  [76-99] 99 (05/10 0654) Resp:  [15-16] 16 (05/10 0654) BP: (110-161)/(49-84) 143/83 mmHg (05/10 0654) SpO2:  [91 %-97 %] 91 % (05/10 0654)  Intake/Output from previous day:  Intake/Output Summary (Last 24 hours) at 11/04/12 0744 Last data filed at 11/04/12 0210  Gross per 24 hour  Intake   1920 ml  Output   4300 ml  Net  -2380 ml     Labs:  Recent Labs  11/02/12 0511 11/03/12 0510 11/04/12 0548  HGB 11.3* 9.6* 9.7*    Recent Labs  11/02/12 0511 11/03/12 0510 11/04/12 0548  WBC 13.8* 13.8* 12.4*  RBC 3.96* 3.33* 3.25*  HCT 32.9* 27.8* 27.1*  PLT 189 172 195    Recent Labs  11/02/12 0511 11/03/12 0510 11/04/12 0548  NA 134* 137 131*  K 4.0 4.4 3.8  CL 100 103 94*  BUN 22 29* 20  CREATININE 0.90 1.00 1.00  GLUCOSE 211* 160* 190*  CALCIUM 8.4 8.4 8.2*    Recent Labs  11/02/12 0511 11/03/12 0510 11/04/12 0548  INR 1.34 1.31 1.19    EXAM General - Patient is Alert, Appropriate and Oriented Extremity - Neurovascular intact Sensation intact distally Dorsiflexion/Plantar flexion intact No cellulitis present Dressing/Incision - clean, dry, no drainage, healing to both knees. Motor Function - intact, moving feet and toes well on exam.    Past Medical History  Diagnosis Date  . DM (diabetes mellitus)   . Other and unspecified hyperlipidemia   . Osteoarthritis   . Nephrolithiasis   .  Anxiety and depression   . Normal nuclear stress test 03-2010  . BPH (benign prostatic hyperplasia)   . HTN (hypertension)     controlled  . OSA on CPAP     mild  . Anxiety   . Depression     mild  . Renal artery stenosis     "some due to ageing"  . Bleeding nose     Right nostril severe bleeding    Assessment/Plan: 3 Days Post-Op Procedure(s) (LRB): TOTAL KNEE BILATERAL (Bilateral) Principal Problem:   OA (osteoarthritis) of knee Active Problems:   Postop Hyponatremia  Estimated body mass index is 34.85 kg/(m^2) as calculated from the following:   Height as of this encounter: 6' (1.829 m).   Weight as of this encounter: 116.574 kg (257 lb). Up with therapy  DVT Prophylaxis - Lovenox and Coumadin, INR today is 1.19 Weight-Bearing as tolerated to both legs  Take Coumadin for four weeks and then discontinue.  The dose may need to be adjusted based upon the INR.  Please follow the INR and titrate Coumadin dose for a therapeutic range between 2.0 and 3.0 INR.  After completing the four weeks of Coumadin, the patient may stop the Coumadin and resume their 81 mg Aspirin daily.  Lovenox injections will start tomorrow evening after the epidural has been removed and continue until the INR  is therapeutic at or greater than 2.0.  When INR reaches the therapeutic level of equal to or greater than 2.0, the patient may discontinue the Lovenox injections.  PERKINS, ALEXZANDREW 11/04/2012, 7:44 AM

## 2012-11-04 NOTE — Progress Notes (Signed)
11/04/12 1400  PT Visit Information  Last PT Received On 11/04/12  Assistance Needed +2 (d/t BP)  PT Time Calculation  PT Start Time 1333  PT Stop Time 1423  PT Time Calculation (min) 50 min  Subjective Data  Subjective I want to do it, I am still woozy  Patient Stated Goal home  after CIR vs SNF  Precautions  Precautions Knee;Fall  Precaution Comments reviewed no pillow under knees  Required Braces or Orthoses Knee Immobilizer - Right;Knee Immobilizer - Left  Knee Immobilizer - Right Discontinue once straight leg raise with < 10 degree lag  Knee Immobilizer - Left Discontinue once straight leg raise with < 10 degree lag  Restrictions  RLE Weight Bearing WBAT  LLE Weight Bearing WBAT  Cognition  Arousal/Alertness Awake/alert  Behavior During Therapy WFL for tasks assessed/performed  Overall Cognitive Status Within Functional Limits for tasks assessed  Bed Mobility  Bed Mobility Sit to Supine  Sit to Supine 1: +2 Total assist;HOB flat  Sit to Supine: Patient Percentage 60%  Details for Bed Mobility Assistance pt with posterior LOB with transition and +2 required to stop pt from hitting head on rail; assist wtih bil LEs onto bed and cues for use of UB  Transfers  Transfers Sit to Stand;Stand to Sit;Stand Pivot Transfers  Sit to Stand With upper extremity assist;1: +2 Total assist;From chair/3-in-1  Sit to Stand: Patient Percentage 70%  Stand to Sit 1: +2 Total assist;With upper extremity assist;To bed  Stand to Sit: Patient Percentage 70%  Stand Pivot Transfers 1: +2 Total assist  Stand Pivot Transfers: Patient Percentage 60%  Details for Transfer Assistance cues for hand placement and LE management  Total Joint Exercises  Ankle Circles/Pumps AROM;Both;10 reps  [employer] AROM;15 reps;Both  Heel Slides AAROM;Both;10 reps  Goniometric ROM RLE 9*-60*/LLE12*-64* AAROM in supine  Straight Leg Raises AROM;AAROM;Both;15 reps  PT - End of Session  Equipment Utilized During  Treatment Gait belt;Right knee immobilizer;Left knee immobilizer  Activity Tolerance Treatment limited secondary to medical complications (Comment)  Patient left in bed;with call bell/phone within reach  Nurse Communication Mobility status;Other (comment) (BPs)  PT - Assessment/Plan  Comments on Treatment Session pt orthostatic this pm, RN aware, had bolus earlier, doesn't seem to have helped; BP  reclined 149/79, standing 112/57, supine148/80; Pt continues to be symptomatic  PT Plan Discharge plan remains appropriate;Frequency remains appropriate  PT Frequency 7X/week  Follow Up Recommendations CIR  PT equipment Rolling walker with 5" wheels  Acute Rehab PT Goals  Time For Goal Achievement 11/11/12  Potential to Achieve Goals Good  Pt will go Supine/Side to Sit with min assist  Pt will go Sit to Supine/Side with min assist  Pt will go Sit to Stand with mod assist  PT Goal: Sit to Stand - Progress Progressing toward goal  Pt will go Stand to Sit with mod assist  PT Goal: Stand to Sit - Progress Progressing toward goal  Pt will Ambulate with min assist;with rolling walker;51 - 150 feet  PT Goal: Ambulate - Progress Other (comment) (due to BP)  Pt will Perform Home Exercise Program with min assist  PT Goal: Perform Home Exercise Program - Progress Progressing toward goal  PT General Charges  $$ ACUTE PT VISIT 1 Procedure  PT Treatments  $Gait Training 8-22 mins  $Therapeutic Exercise 23-37 mins

## 2012-11-04 NOTE — Progress Notes (Signed)
Orthostatic BP done after IVB completed. See VS flowsheet. Pt reported being light-headed when sitting and when reclining after done. Labron Bloodgood, [employer]

## 2012-11-04 NOTE — Plan of Care (Signed)
Problem: Phase III Progression Outcomes Goal: Other Phase III Outcomes/Goals Outcome: Progressing Orthostatic BPs

## 2012-11-04 NOTE — Progress Notes (Signed)
Pt up with PT, walked 10 feet. VSS, however pt symptomatic, becoming diaphoretic & clammy, stating he felt 'crazy head' (or dizzy). PA Avel Peace notified & orders received. Giving IVB, will check orthostatic VS, and holding transfer to rehab. Trayce Caravello, [employer]

## 2012-11-04 NOTE — Progress Notes (Signed)
Physical Therapy Treatment Patient Details Name: Leonard [name] MRN: [id number] DOB: [birth year] Today's Date: 11/04/2012 Time: 7829-5621 PT Time Calculation (min): 26 min  PT Assessment / Plan / Recommendation Comments on Treatment Session  pt continues to be limited by medical issues/effects of meds(?).Marland KitchenMarland Kitchenpt feeling dizzy (unchanged) throughout session; Not ready for CIR from PT standpoint at this time; will continue to follow;    Follow Up Recommendations  CIR     Does the patient have the potential to tolerate intense rehabilitation     Barriers to Discharge        Equipment Recommendations  Rolling walker with 5" wheels    Recommendations for Other Services    Frequency 7X/week   Plan Discharge plan remains appropriate;Frequency remains appropriate    Precautions / Restrictions Precautions Precautions: Knee;Fall Restrictions RLE Weight Bearing: Weight bearing as tolerated LLE Weight Bearing: Weight bearing as tolerated   Pertinent Vitals/Pain Pain "not too bad"    Mobility  Bed Mobility Bed Mobility: Sit to Supine Supine to Sit: 1: +2 Total assist;HOB flat Supine to Sit: Patient Percentage: 70% Details for Bed Mobility Assistance: cues for hands and wt shift and bil LE management, assist wtih bil LEs Transfers Transfers: Sit to Stand;Stand to Sit Sit to Stand: 1: +2 Total assist;From bed Sit to Stand: Patient Percentage: 60% Stand to Sit: 1: +2 Total assist;With armrests;To chair/3-in-1 Stand to Sit: Patient Percentage: 60% Details for Transfer Assistance: cues for hand placement and LE management Ambulation/Gait Ambulation/Gait Assistance: 1: +2 Total assist Ambulation/Gait: Patient Percentage: 60% Ambulation Distance (Feet): 11 Feet Assistive device: Rolling walker Ambulation/Gait Assistance Details: cues for RW distance from  self, breathing, increased WBing thru LEs/decreased use of UEs as stolerated; pt became diaphoretic after 10', chair to pt, pt  recovered quickly once reclined; BPs stable throughout (supine, sitting and after amb) HR 90-111; Sats 93-97% on RA Gait Pattern: Step-to pattern;Trunk flexed    Exercises Total Joint Exercises Ankle Circles/Pumps: AROM;Both;10 reps Quad Sets: AROM;Both;10 reps   PT Diagnosis:    PT Problem List:   PT Treatment Interventions:     PT Goals Acute Rehab PT Goals Time For Goal Achievement: 11/11/12 Potential to Achieve Goals: Good Pt will go Supine/Side to Sit: with min assist PT Goal: Supine/Side to Sit - Progress: Progressing toward goal Pt will go Sit to Stand: with mod assist PT Goal: Sit to Stand - Progress: Progressing toward goal Pt will go Stand to Sit: with mod assist PT Goal: Stand to Sit - Progress: Progressing toward goal Pt will Ambulate: with min assist;with rolling walker;51 - 150 feet PT Goal: Ambulate - Progress: Progressing toward goal Pt will Perform Home Exercise Program: with min assist PT Goal: Perform Home Exercise Program - Progress: Progressing toward goal  Visit Information  Last PT Received On: 11/04/12 Assistance Needed: +2    Subjective Data  Subjective: I am ok, it was a rough night Patient Stated Goal: home  after CIR vs SNF   Cognition  Cognition Arousal/Alertness: Awake/alert Behavior During Therapy: WFL for tasks assessed/performed Overall Cognitive Status: Within Functional Limits for tasks assessed    Balance     End of Session PT - End of Session Equipment Utilized During Treatment: Gait belt;Right knee immobilizer;Left knee immobilizer Activity Tolerance: Treatment limited secondary to medical complications (Comment) Patient left: in chair;with call bell/phone within reach;with family/visitor present;with nursing in room Nurse Communication: Mobility status   GP     [facility] 11/04/2012, 9:50 AM

## 2012-11-04 NOTE — Progress Notes (Signed)
Leonard [name], [place], notified re orthostatic VS & report from PT that pt did about the same (including light-headed) when assisted back to bed for excercises. Info acknowledged. Margaretha Mahan, [employer]

## 2012-11-04 NOTE — Progress Notes (Signed)
ANTICOAGULATION CONSULT NOTE - Follow Up Consult  Pharmacy Consult for Warfarin Indication: VTE prophylaxis  No Known Allergies  Patient Measurements: Height: 6' (182.9 cm) Weight: 257 lb (116.574 kg) IBW/kg (Calculated) : 77.6  Vital Signs: Temp: 98.8 F (37.1 C) (05/10 0654) Temp src: Oral (05/10 0654) BP: 143/83 mmHg (05/10 0654) Pulse Rate: 99 (05/10 0654)  Labs:  Recent Labs  11/02/12 0511 11/03/12 0510 11/04/12 0548  HGB 11.3* 9.6* 9.7*  HCT 32.9* 27.8* 27.1*  PLT 189 172 195  LABPROT 16.3* 16.0* 14.9  INR 1.34 1.31 1.19  CREATININE 0.90 1.00 1.00    Estimated Creatinine Clearance: 108.7 ml/min (by C-G formula based on Cr of 1).   Medications:  Scheduled:  . allopurinol  100 mg Oral QAC breakfast  . amLODipine  10 mg Oral QHS   And  . benazepril  40 mg Oral QHS  . atorvastatin  20 mg Oral QHS  . docusate sodium  100 mg Oral BID  . [COMPLETED] enoxaparin (LOVENOX) injection  30 mg Subcutaneous Once  . enoxaparin (LOVENOX) injection  30 mg Subcutaneous Q12H  . hydrochlorothiazide  25 mg Oral QAC breakfast  . insulin aspart  0-15 Units Subcutaneous TID WC  . linagliptin  5 mg Oral QAC breakfast   And  . metFORMIN  500 mg Oral BID WC  . potassium citrate  10 mEq Oral BID  . scopolamine  1 patch Transdermal Once  . [COMPLETED] sodium chloride  250 mL Intravenous Once  . tamsulosin  0.4 mg Oral QHS  . [COMPLETED] warfarin  7.5 mg Oral ONCE-1800  . Warfarin - Pharmacist Dosing Inpatient   Does not apply q1800   Infusions:  . 0.9 % NaCl with KCl 20 mEq / L Stopped (11/03/12 1120)  . bupivacaine (MARCAINE, SENSORCAINE) epidural 12 mL/hr at 11/03/12 0430  . naLOXone [facility]) adult infusion for PRURITIS      Assessment: 57 yo male s/p bilateral TKA on 5/7, started on warfarin VTE prophylaxis 5/8. Lovenox 30mg  q12h started last night 12hrs after epidural discontinued.  INR decreased after 2 doses warfarin (5mg  5/8, 7.5mg  5/9)  CBC decreased as expected  postop, no bleeding/complications reported.  SCr stable  Goal of Therapy:  INR 2-3 Monitor platelets by anticoagulation protocol: Yes   Plan:   Warfarin 7.5mg  today  Daily PT/INR  Continue lovenox per MD  Provide warfarin education prior to discharge  Loralee Pacas, PharmD, BCPS Pager: [phone] 11/04/2012,10:03 AM

## 2012-11-04 NOTE — Progress Notes (Signed)
OT Cancellation Note  Patient Details Name: ADONNIS SALCEDA MRN: [id number] DOB: September 17, 1955   Cancelled Treatment:    Reason Eval/Treat Not Completed: Other (comment)  Pt was orthostatic with PT this pm.  Will reattempt another time.    Alika Saladin 11/04/2012, 2:33 PM Marica Otter, OTR/L [phone] 11/04/2012

## 2012-11-05 ENCOUNTER — Inpatient Hospital Stay (HOSPITAL_COMMUNITY): Payer: BC Managed Care – PPO | Admitting: *Deleted

## 2012-11-05 ENCOUNTER — Inpatient Hospital Stay (HOSPITAL_COMMUNITY): Payer: BC Managed Care – PPO

## 2012-11-05 LAB — CBC
HCT: 27.1 % — ABNORMAL LOW (ref 39.0–52.0)
Hemoglobin: 9.6 g/dL — ABNORMAL LOW (ref 13.0–17.0)
MCHC: 35.4 g/dL (ref 30.0–36.0)
RBC: 3.24 MIL/uL — ABNORMAL LOW (ref 4.22–5.81)
WBC: 11.8 10*3/uL — ABNORMAL HIGH (ref 4.0–10.5)

## 2012-11-05 LAB — GLUCOSE, CAPILLARY
Glucose-Capillary: 188 mg/dL — ABNORMAL HIGH (ref 70–99)
Glucose-Capillary: 183 mg/dL — ABNORMAL HIGH (ref 70–99)
Glucose-Capillary: 230 mg/dL — ABNORMAL HIGH (ref 70–99)

## 2012-11-05 LAB — PROTIME-INR
INR: 1.29 (ref 0.00–1.49)
Prothrombin Time: 15.8 seconds — ABNORMAL HIGH (ref 11.6–15.2)

## 2012-11-05 MED ORDER — SODIUM CHLORIDE 0.9 % IV BOLUS (SEPSIS)
250.0000 mL | Freq: Once | INTRAVENOUS | Status: AC
  Administered 2012-11-05: 250 mL via INTRAVENOUS

## 2012-11-05 MED ORDER — WARFARIN SODIUM 10 MG PO TABS
10.0000 mg | ORAL_TABLET | Freq: Once | ORAL | Status: AC
  Administered 2012-11-05: 10 mg via ORAL
  Filled 2012-11-05: qty 1

## 2012-11-05 NOTE — Progress Notes (Signed)
ANTICOAGULATION CONSULT NOTE - Follow Up Consult  Pharmacy Consult for Warfarin Indication: VTE prophylaxis  No Known Allergies  Patient Measurements: Height: 6' (182.9 cm) Weight: 257 lb (116.574 kg) IBW/kg (Calculated) : 77.6  Vital Signs: Temp: 98.6 F (37 C) (05/11 0609) Temp src: Oral (05/11 0609) BP: 109/67 mmHg (05/11 0609) Pulse Rate: 97 (05/11 0609)  Labs:  Recent Labs  11/03/12 0510 11/04/12 0548 11/05/12 0450  HGB 9.6* 9.7*  --   HCT 27.8* 27.1*  --   PLT 172 195  --   LABPROT 16.0* 14.9 15.8*  INR 1.31 1.19 1.29  CREATININE 1.00 1.00  --     Estimated Creatinine Clearance: 108.7 ml/min (by C-G formula based on Cr of 1).   Medications:  Scheduled:  . allopurinol  100 mg Oral QAC breakfast  . amLODipine  10 mg Oral QHS   And  . benazepril  40 mg Oral QHS  . atorvastatin  20 mg Oral QHS  . docusate sodium  100 mg Oral BID  . enoxaparin (LOVENOX) injection  30 mg Subcutaneous Q12H  . hydrochlorothiazide  25 mg Oral QAC breakfast  . insulin aspart  0-15 Units Subcutaneous TID WC  . linagliptin  5 mg Oral QAC breakfast   And  . metFORMIN  500 mg Oral BID WC  . potassium citrate  10 mEq Oral BID  . scopolamine  1 patch Transdermal Once  . [COMPLETED] sodium chloride  250 mL Intravenous Once  . tamsulosin  0.4 mg Oral QHS  . [COMPLETED] warfarin  7.5 mg Oral ONCE-1800  . Warfarin - Pharmacist Dosing Inpatient   Does not apply q1800   Infusions:  . 0.9 % NaCl with KCl 20 mEq / L Stopped (11/03/12 1120)  . bupivacaine (MARCAINE, SENSORCAINE) epidural 12 mL/hr at 11/03/12 0430  . naLOXone [facility]) adult infusion for PRURITIS    Inpatient warfarin doses: 5mg  (5/8), 7.5mg  (5/9), 7.5mg  (5/10)  Assessment: 57 yo male s/p bilateral TKA on 5/7, started on warfarin VTE prophylaxis 5/8. Lovenox 30mg  q12h started 5/9 12hrs after epidural discontinued.  INR slow to respond after 3 doses (1.29)  CBC decreased yesterday as expected postop , no  bleeding/complications reported.  SCr stable yesterday  Patient and wife educated on warfarin therapy (indication, adverse effects, INR monitoring, drug and food interactions). They demonstrated proper understanding.  Goal of Therapy:  INR 2-3 Monitor platelets by anticoagulation protocol: Yes   Plan:   Increase Warfarin 10mg  today  Daily PT/INR  Continue lovenox per MD   Loralee Pacas, PharmD, BCPS Pager: [phone] 11/05/2012,7:12 AM

## 2012-11-05 NOTE — Progress Notes (Signed)
Occupational Therapy Treatment Patient Details Name: Leonard [name] MRN: [id number] DOB: 10/14/1955 Today's Date: 11/05/2012 Time: 0832-0900 OT Time Calculation (min): 28 min  OT Assessment / Plan / Recommendation Comments on Treatment Session This 57 yo male making progress. Will benefit from inpatient rehab stay to get to a Mod I level before D/C'ing home    Follow Up Recommendations  CIR       Equipment Recommendations  3 in 1 bedside comode    Recommendations for Other Services Rehab consult  Frequency Min 2X/week      Precautions / Restrictions Precautions Precautions: Knee;Fall Precaution Comments: Instructed pt on KI use for amb until able to perform 10 active SLR Ind Required Braces or Orthoses: Knee Immobilizer - Right;Knee Immobilizer - Left Knee Immobilizer - Right: Discontinue once straight leg raise with < 10 degree lag Knee Immobilizer - Left: Discontinue once straight leg raise with < 10 degree lag Restrictions Weight Bearing Restrictions: No RLE Weight Bearing: Weight bearing as tolerated LLE Weight Bearing: Weight bearing as tolerated   Pertinent Vitals/Pain 2/10 Bil knees upon standing    ADL  Toilet Transfer: Simulated;+2 Total assistance (from raised surface) Toilet Transfer: Patient Percentage: 70% Toilet Transfer Method: Sit to Barista:  (Raised bed>ambulate>sit on raised seat of recliner) Transfers/Ambulation Related to ADLs: +2 total assist, pt 40% for sit to stand and stand to sit; total A+2 (pt=80%) for ambulation with Bil KI ADL Comments: I raised the 3n1 to it's highest setting due to pt having to use Bil KI    OT Goals ADL Goals ADL Goal: Toilet Transfer - Progress: Progressing toward goals  Visit Information  Last OT Received On: 11/05/12 Assistance Needed: +1 PT/OT Co-Evaluation/Treatment: Yes          Cognition  Cognition Arousal/Alertness: Awake/alert Behavior During Therapy: WFL for tasks  assessed/performed Overall Cognitive Status: Within Functional Limits for tasks assessed    Mobility  Bed Mobility Bed Mobility: Supine to Sit Supine to Sit: 1: +2 Total assist;HOB elevated;With rails Supine to Sit: Patient Percentage: 70% Details for Bed Mobility Assistance: Support of Bil LEs throughout process Transfers Transfers: Sit to Stand;Stand to Sit Sit to Stand: 1: +1 Total assist;From elevated surface;With upper extremity assist;From bed Sit to Stand: Patient Percentage: 70% Stand to Sit: 1: +2 Total assist;With upper extremity assist;With armrests;To chair/3-in-1 Stand to Sit: Patient Percentage: 70% Details for Transfer Assistance: cues for hand placement and LE management and elevated surface due to B KI's          End of Session OT - End of Session Equipment Utilized During Treatment: Gait belt (RW) Activity Tolerance: Patient tolerated treatment well Patient left: in chair;with call bell/phone within reach       Evette Georges [phone] 11/05/2012, 12:21 PM

## 2012-11-05 NOTE — Progress Notes (Signed)
Physical Therapy Treatment Patient Details Name: Leonard [name] MRN: [id number] DOB: 08/14/1955 Today's Date: 11/05/2012 Time: 7829-5621 PT Time Calculation (min): 56 min  PT Assessment / Plan / Recommendation Comments on Treatment Session  B TKR POD #4 w/ mod c/o feeling "woozy" with each position change.  BP's and HR monitored, see below.  Assisted OOB to amb limited distance 2nd woozyness and fear of passing out.  Performed TE's then applied ICE.  Pt plans to D/C to CIR for Rehab. Supine BP 123/68(79), HR 114 EOB BP 97/65(70), HR 130 EOB x 3 min BP 117/70(80) HR 127 Standing BP 127/78(86), HR 133 Noted increased anxiety/fear of passing out feeling with activity Also pt stated he did not like how the meds made him feel, woozy,swimmy....reported to RN  Follow Up Recommendations  CIR     Does the patient have the potential to tolerate intense rehabilitation     Barriers to Discharge        Equipment Recommendations  Rolling walker with 5" wheels    Recommendations for Other Services    Frequency 7X/week   Plan Discharge plan remains appropriate;Frequency remains appropriate    Precautions / Restrictions Precautions Precautions: Knee;Fall Precaution Comments: Instructed pt on KI use for amb until able to perform 10 active SLR Ind Required Braces or Orthoses: Knee Immobilizer - Right;Knee Immobilizer - Left Knee Immobilizer - Right: Discontinue once straight leg raise with < 10 degree lag Knee Immobilizer - Left: Discontinue once straight leg raise with < 10 degree lag Restrictions Weight Bearing Restrictions: No RLE Weight Bearing: Weight bearing as tolerated LLE Weight Bearing: Weight bearing as tolerated   Pertinent Vitals/Pain C/o 5/10 B knee pain "about the same" ICE applied    Mobility  Bed Mobility Bed Mobility: Supine to Sit Supine to Sit: 1: +2 Total assist;HOB elevated;With rails Supine to Sit: Patient Percentage: 70% Details for Bed Mobility Assistance:  Support of Bil LEs throughout process Transfers Transfers: Sit to Stand;Stand to Sit;Stand Pivot Transfers Sit to Stand: 1: +1 Total assist;From elevated surface;With upper extremity assist;From bed Sit to Stand: Patient Percentage: 70% Stand to Sit: 1: +2 Total assist;With upper extremity assist;With armrests;To chair/3-in-1 Stand to Sit: Patient Percentage: 70% Details for Transfer Assistance: cues for hand placement and LE management and elevated surface due to B KI's Ambulation/Gait Ambulation/Gait Assistance: 1: +2 Total assist Ambulation/Gait: Patient Percentage: 60% Ambulation Distance (Feet): 20 Feet Assistive device: Rolling walker Ambulation/Gait Assistance Details: increased time and Mod c/o feeling woozy with some anxiety about the fear of passing out.  MAX VC's given as we monitored pt's BP and HR. Pt c/o "sweating" as HR 133. Gait Pattern: Step-to pattern;Trunk flexed General Gait Details: heavy use of UEs although able to bear more wt and advance RLE this pm (epidural out)    Exercises   Total Knee Replacement TE's 10 reps B LE ankle pumps 10 reps knee presses 10 reps heel slides  10 reps SAQ's 10 reps SLR's 10 reps ABD Followed by ICE   PT Goals                                                           progressing    Visit Information  Last PT Received On: 11/05/12 Assistance Needed: +1    Subjective Data  Subjective: I  still feel woozy when I get up   Cognition  Cognition Arousal/Alertness: Awake/alert Behavior During Therapy: WFL for tasks assessed/performed Overall Cognitive Status: Within Functional Limits for tasks assessed    Balance     End of Session PT - End of Session Equipment Utilized During Treatment: Gait belt;Right knee immobilizer;Left knee immobilizer Activity Tolerance: Patient limited by fatigue Patient left: in chair;with call bell/phone within reach   Felecia Shelling  PTA [facility]  Acute  Rehab Pager      [phone]

## 2012-11-05 NOTE — Progress Notes (Signed)
   Subjective: 4 Days Post-Op Procedure(s) (LRB): TOTAL KNEE BILATERAL (Bilateral)  Pt doing quite well Mild pain to knees Therapy going well Patient reports pain as mild.  Objective:   VITALS:   Filed Vitals:   11/05/12 0609  BP: 109/67  Pulse: 97  Temp: 98.6 F (37 C)  Resp: 16    Bilateral knee incisions healing well nv intact distally No edema or erythema  LABS  Recent Labs  11/03/12 0510 11/04/12 0548 11/05/12 0450  HGB 9.6* 9.7* 9.6*  HCT 27.8* 27.1* 27.1*  WBC 13.8* 12.4* 11.8*  PLT 172 195 209     Recent Labs  11/03/12 0510 11/04/12 0548  NA 137 131*  K 4.4 3.8  BUN 29* 20  CREATININE 1.00 1.00  GLUCOSE 160* 190*     Assessment/Plan: 4 Days Post-Op Procedure(s) (LRB): TOTAL KNEE BILATERAL (Bilateral)   Pain control Continue PT D/c planning   Alphonsa Overall, MPAS, PA-C  11/05/2012, 7:24 AM

## 2012-11-05 NOTE — Progress Notes (Signed)
Physical Therapy Treatment Patient Details Name: Leonard [name] MRN: [id number] DOB: [birth year] Today's Date: 11/05/2012 Time: 4098-1191 PT Time Calculation (min): 36 min  PT Assessment / Plan / Recommendation Comments on Treatment Session  B TKR POD #4 pm session.  Pt stated he felt alittle better.   BP maintained much better after receiving bolus.  sitting 120/76.....standing 124/81     Assisted pt back to bed for CPM.    Follow Up Recommendations  CIR     Does the patient have the potential to tolerate intense rehabilitation     Barriers to Discharge        Equipment Recommendations  Rolling walker with 5" wheels    Recommendations for Other Services    Frequency 7X/week   Plan Discharge plan remains appropriate;Frequency remains appropriate    Precautions / Restrictions Precautions Precautions: Knee;Fall Precaution Comments: Instructed pt on KI use for amb until able to perform 10 active SLR Ind Required Braces or Orthoses: Knee Immobilizer - Right;Knee Immobilizer - Left Knee Immobilizer - Right: Discontinue once straight leg raise with < 10 degree lag Knee Immobilizer - Left: Discontinue once straight leg raise with < 10 degree lag Restrictions Weight Bearing Restrictions: No RLE Weight Bearing: Weight bearing as tolerated LLE Weight Bearing: Weight bearing as tolerated   Pertinent Vitals/Pain C/o "stifness"    Mobility  Bed Mobility Bed Mobility: Sit to Supine Sit to Supine: 1: +2 Total assist;HOB flat Sit to Supine: Patient Percentage: 70% Details for Bed Mobility Assistance: Support of Bil LEs throughout process Transfers Transfers: Sit to Stand;Stand to Sit;Stand Pivot Transfers Sit to Stand: 1: +1 Total assist;From elevated surface;With upper extremity assist;From chair/3-in-1 Stand to Sit: 1: +2 Total assist;With upper extremity assist;With armrests;To bed;To elevated surface Details for Transfer Assistance: cues for hand placement and LE management  and elevated surface due to B KI's and increase time Ambulation/Gait Ambulation/Gait Assistance: 1: +2 Total assist Ambulation/Gait: Patient Percentage: 70% Ambulation Distance (Feet): 28 Feet Assistive device: Rolling walker Ambulation/Gait Assistance Details: increased time and one VC on proper walker to self distance.  BP much better after bolus.  sitting 120/76...Marland KitchenMarland KitchenMarland Kitchenstanding 124/81      Amb pt back to bed. Gait Pattern: Step-to pattern;Trunk flexed Gait velocity: decreased      PT Goals    Visit Information  Last PT Received On: 11/05/12 Assistance Needed: +2    Subjective Data  Subjective: I feel a little better when I get up   Cognition    good   Balance   fair  End of Session PT - End of Session Equipment Utilized During Treatment: Gait belt;Right knee immobilizer;Left knee immobilizer Activity Tolerance: Patient limited by fatigue Patient left: in bed;with call bell/phone within reach;with family/visitor present   Felecia Shelling  PTA [facility]  Acute  Rehab Pager      [phone]

## 2012-11-06 ENCOUNTER — Inpatient Hospital Stay (HOSPITAL_COMMUNITY): Payer: BC Managed Care – PPO | Admitting: *Deleted

## 2012-11-06 ENCOUNTER — Encounter (HOSPITAL_COMMUNITY): Payer: BC Managed Care – PPO

## 2012-11-06 ENCOUNTER — Inpatient Hospital Stay (HOSPITAL_COMMUNITY)
Admission: RE | Admit: 2012-11-06 | Discharge: 2012-11-13 | DRG: 462 | Disposition: A | Discharge status: Home / Self Care | Payer: BC Managed Care – PPO | Source: Intra-hospital | Attending: Physical Medicine & Rehabilitation | Admitting: Physical Medicine & Rehabilitation

## 2012-11-06 ENCOUNTER — Inpatient Hospital Stay (HOSPITAL_COMMUNITY): Payer: BC Managed Care – PPO

## 2012-11-06 DIAGNOSIS — M1710 Unilateral primary osteoarthritis, unspecified knee: Secondary | ICD-10-CM

## 2012-11-06 DIAGNOSIS — Z5189 Encounter for other specified aftercare: Principal | ICD-10-CM

## 2012-11-06 DIAGNOSIS — M170 Bilateral primary osteoarthritis of knee: Secondary | ICD-10-CM | POA: Diagnosis present

## 2012-11-06 DIAGNOSIS — I10 Essential (primary) hypertension: Secondary | ICD-10-CM | POA: Diagnosis present

## 2012-11-06 DIAGNOSIS — K5900 Constipation, unspecified: Secondary | ICD-10-CM | POA: Diagnosis present

## 2012-11-06 DIAGNOSIS — D62 Acute posthemorrhagic anemia: Secondary | ICD-10-CM

## 2012-11-06 DIAGNOSIS — E785 Hyperlipidemia, unspecified: Secondary | ICD-10-CM | POA: Diagnosis present

## 2012-11-06 DIAGNOSIS — IMO0002 Reserved for concepts with insufficient information to code with codable children: Secondary | ICD-10-CM | POA: Diagnosis present

## 2012-11-06 DIAGNOSIS — E871 Hypo-osmolality and hyponatremia: Secondary | ICD-10-CM | POA: Diagnosis present

## 2012-11-06 DIAGNOSIS — G4733 Obstructive sleep apnea (adult) (pediatric): Secondary | ICD-10-CM | POA: Diagnosis present

## 2012-11-06 DIAGNOSIS — D72829 Elevated white blood cell count, unspecified: Secondary | ICD-10-CM | POA: Diagnosis present

## 2012-11-06 DIAGNOSIS — E119 Type 2 diabetes mellitus without complications: Secondary | ICD-10-CM | POA: Diagnosis present

## 2012-11-06 DIAGNOSIS — Z96659 Presence of unspecified artificial knee joint: Secondary | ICD-10-CM

## 2012-11-06 DIAGNOSIS — I951 Orthostatic hypotension: Secondary | ICD-10-CM | POA: Diagnosis present

## 2012-11-06 LAB — CBC WITH DIFFERENTIAL/PLATELET
Eosinophils Absolute: 0.2 10*3/uL (ref 0.0–0.7)
Hemoglobin: 9.2 g/dL — ABNORMAL LOW (ref 13.0–17.0)
Lymphocytes Relative: 14 % (ref 12–46)
Lymphs Abs: 1.7 10*3/uL (ref 0.7–4.0)
MCH: 29.7 pg (ref 26.0–34.0)
Monocytes Relative: 15 % — ABNORMAL HIGH (ref 3–12)
Neutrophils Relative %: 69 % (ref 43–77)
RBC: 3.1 MIL/uL — ABNORMAL LOW (ref 4.22–5.81)
WBC: 12 10*3/uL — ABNORMAL HIGH (ref 4.0–10.5)

## 2012-11-06 LAB — URINALYSIS, ROUTINE W REFLEX MICROSCOPIC
Bilirubin Urine: NEGATIVE
Ketones, ur: NEGATIVE mg/dL
Leukocytes, UA: NEGATIVE
Nitrite: NEGATIVE
Protein, ur: NEGATIVE mg/dL
Urobilinogen, UA: 0.2 mg/dL (ref 0.0–1.0)

## 2012-11-06 LAB — COMPREHENSIVE METABOLIC PANEL
ALT: 20 U/L (ref 0–53)
Alkaline Phosphatase: 52 U/L (ref 39–117)
BUN: 30 mg/dL — ABNORMAL HIGH (ref 6–23)
CO2: 26 mEq/L (ref 19–32)
Chloride: 94 mEq/L — ABNORMAL LOW (ref 96–112)
GFR calc Af Amer: 73 mL/min — ABNORMAL LOW (ref >90)
GFR calc non Af Amer: 63 mL/min — ABNORMAL LOW (ref >90)
Glucose, Bld: 140 mg/dL — ABNORMAL HIGH (ref 70–99)
Potassium: 4.5 mEq/L (ref 3.5–5.1)
Total Bilirubin: 0.6 mg/dL (ref 0.3–1.2)
Total Protein: 7 g/dL (ref 6.0–8.3)

## 2012-11-06 LAB — PROTIME-INR: INR: 1.39 (ref 0.00–1.49)

## 2012-11-06 LAB — GLUCOSE, CAPILLARY: Glucose-Capillary: 169 mg/dL — ABNORMAL HIGH (ref 70–99)

## 2012-11-06 MED ORDER — TRAZODONE HCL 50 MG PO TABS
25.0000 mg | ORAL_TABLET | Freq: Every evening | ORAL | Status: DC | PRN
  Administered 2012-11-07: 50 mg via ORAL
  Filled 2012-11-06: qty 1

## 2012-11-06 MED ORDER — INSULIN ASPART 100 UNIT/ML ~~LOC~~ SOLN
0.0000 [IU] | Freq: Three times a day (TID) | SUBCUTANEOUS | Status: DC
Start: 2012-11-06 — End: 2012-11-06

## 2012-11-06 MED ORDER — OXYCODONE HCL 5 MG PO TABS: 5.0000 mg | ORAL_TABLET | ORAL | Status: DC | PRN

## 2012-11-06 MED ORDER — POLYSACCHARIDE IRON COMPLEX 150 MG PO CAPS
150.0000 mg | ORAL_CAPSULE | Freq: Two times a day (BID) | ORAL | Status: DC
  Administered 2012-11-06 – 2012-11-13 (×15): 150 mg via ORAL
  Filled 2012-11-06 (×16): qty 1

## 2012-11-06 MED ORDER — POLYETHYLENE GLYCOL 3350 17 G PO PACK
17.0000 g | PACK | Freq: Two times a day (BID) | ORAL | Status: DC
  Administered 2012-11-06 – 2012-11-13 (×15): 17 g via ORAL
  Filled 2012-11-06 (×18): qty 1

## 2012-11-06 MED ORDER — DIPHENHYDRAMINE HCL 12.5 MG/5ML PO ELIX
12.5000 mg | ORAL_SOLUTION | ORAL | Status: DC | PRN
  Filled 2012-11-06: qty 10

## 2012-11-06 MED ORDER — ALLOPURINOL 100 MG PO TABS
100.0000 mg | ORAL_TABLET | Freq: Every day | ORAL | Status: DC
  Administered 2012-11-07 – 2012-11-13 (×7): 100 mg via ORAL
  Filled 2012-11-06 (×8): qty 1

## 2012-11-06 MED ORDER — INSULIN ASPART 100 UNIT/ML ~~LOC~~ SOLN
0.0000 [IU] | Freq: Three times a day (TID) | SUBCUTANEOUS | Status: DC
  Administered 2012-11-06: 4 [IU] via SUBCUTANEOUS
  Administered 2012-11-07: 7 [IU] via SUBCUTANEOUS
  Administered 2012-11-07 (×2): 3 [IU] via SUBCUTANEOUS
  Administered 2012-11-08 (×3): 4 [IU] via SUBCUTANEOUS
  Administered 2012-11-09: 3 [IU] via SUBCUTANEOUS
  Administered 2012-11-09 (×2): 4 [IU] via SUBCUTANEOUS
  Administered 2012-11-10: 11 [IU] via SUBCUTANEOUS
  Administered 2012-11-10: 7 [IU] via SUBCUTANEOUS
  Administered 2012-11-10: 12:00:00 via SUBCUTANEOUS
  Administered 2012-11-11: 4 [IU] via SUBCUTANEOUS
  Administered 2012-11-11: 3 [IU] via SUBCUTANEOUS
  Administered 2012-11-11: 4 [IU] via SUBCUTANEOUS
  Administered 2012-11-12 (×2): 3 [IU] via SUBCUTANEOUS
  Administered 2012-11-12 – 2012-11-13 (×3): 4 [IU] via SUBCUTANEOUS

## 2012-11-06 MED ORDER — METFORMIN HCL 500 MG PO TABS
500.0000 mg | ORAL_TABLET | Freq: Two times a day (BID) | ORAL | Status: DC
  Administered 2012-11-06 – 2012-11-13 (×15): 500 mg via ORAL
  Filled 2012-11-06 (×16): qty 1

## 2012-11-06 MED ORDER — PROCHLORPERAZINE MALEATE 5 MG PO TABS
5.0000 mg | ORAL_TABLET | Freq: Four times a day (QID) | ORAL | Status: DC | PRN
  Filled 2012-11-06: qty 2

## 2012-11-06 MED ORDER — LISINOPRIL 40 MG PO TABS
40.0000 mg | ORAL_TABLET | Freq: Every day | ORAL | Status: DC
  Administered 2012-11-06 – 2012-11-11 (×5): 40 mg via ORAL
  Filled 2012-11-06 (×8): qty 1

## 2012-11-06 MED ORDER — ENOXAPARIN SODIUM 30 MG/0.3ML ~~LOC~~ SOLN
30.0000 mg | Freq: Two times a day (BID) | SUBCUTANEOUS | Status: DC
  Administered 2012-11-06 – 2012-11-11 (×10): 30 mg via SUBCUTANEOUS
  Filled 2012-11-06 (×11): qty 0.3

## 2012-11-06 MED ORDER — CLONAZEPAM 0.5 MG PO TABS: 0.2500 mg | ORAL_TABLET | Freq: Two times a day (BID) | ORAL | Status: DC | PRN

## 2012-11-06 MED ORDER — LINAGLIPTIN 5 MG PO TABS
5.0000 mg | ORAL_TABLET | Freq: Every day | ORAL | Status: DC
  Administered 2012-11-07 – 2012-11-13 (×7): 5 mg via ORAL
  Filled 2012-11-06 (×8): qty 1

## 2012-11-06 MED ORDER — INSULIN ASPART 100 UNIT/ML ~~LOC~~ SOLN: 0.0000 [IU] | Freq: Every day | SUBCUTANEOUS | Status: DC

## 2012-11-06 MED ORDER — AMLODIPINE BESYLATE 10 MG PO TABS
10.0000 mg | ORAL_TABLET | Freq: Every day | ORAL | Status: DC
  Administered 2012-11-06 – 2012-11-11 (×5): 10 mg via ORAL
  Filled 2012-11-06 (×8): qty 1

## 2012-11-06 MED ORDER — ENOXAPARIN SODIUM 30 MG/0.3ML ~~LOC~~ SOLN
30.0000 mg | Freq: Two times a day (BID) | SUBCUTANEOUS | Status: DC
  Filled 2012-11-06: qty 0.3

## 2012-11-06 MED ORDER — HYDROCHLOROTHIAZIDE 25 MG PO TABS
25.0000 mg | ORAL_TABLET | Freq: Every day | ORAL | Status: DC
  Administered 2012-11-09 – 2012-11-13 (×5): 25 mg via ORAL
  Filled 2012-11-06 (×6): qty 1

## 2012-11-06 MED ORDER — OXYCODONE HCL 5 MG PO TABS
10.0000 mg | ORAL_TABLET | ORAL | Status: DC | PRN
  Administered 2012-11-06 (×2): 20 mg via ORAL
  Administered 2012-11-06: 10 mg via ORAL
  Filled 2012-11-06: qty 2
  Filled 2012-11-06 (×2): qty 4

## 2012-11-06 MED ORDER — SORBITOL 70 % SOLN
45.0000 mL | Freq: Once | Status: AC
  Administered 2012-11-06: 45 mL via ORAL
  Filled 2012-11-06: qty 60

## 2012-11-06 MED ORDER — BISACODYL 10 MG RE SUPP: 10.0000 mg | Freq: Every day | RECTAL | Status: DC | PRN

## 2012-11-06 MED ORDER — WARFARIN SODIUM 10 MG PO TABS
10.0000 mg | ORAL_TABLET | Freq: Once | ORAL | Status: AC
  Administered 2012-11-06: 10 mg via ORAL
  Filled 2012-11-06: qty 1

## 2012-11-06 MED ORDER — METHOCARBAMOL 500 MG PO TABS
500.0000 mg | ORAL_TABLET | Freq: Four times a day (QID) | ORAL | Status: DC
  Administered 2012-11-06 – 2012-11-07 (×6): 500 mg via ORAL
  Administered 2012-11-08: 1000 mg via ORAL
  Administered 2012-11-08 – 2012-11-11 (×13): 500 mg via ORAL
  Administered 2012-11-11: 1000 mg via ORAL
  Administered 2012-11-11 – 2012-11-12 (×4): 500 mg via ORAL
  Administered 2012-11-12: 1000 mg via ORAL
  Administered 2012-11-13: 500 mg via ORAL
  Administered 2012-11-13: 1000 mg via ORAL
  Administered 2012-11-13: 500 mg via ORAL
  Filled 2012-11-06 (×3): qty 2
  Filled 2012-11-06: qty 1
  Filled 2012-11-06 (×18): qty 2
  Filled 2012-11-06: qty 1
  Filled 2012-11-06 (×10): qty 2
  Filled 2012-11-06: qty 1

## 2012-11-06 MED ORDER — HYDROCHLOROTHIAZIDE 25 MG PO TABS
25.0000 mg | ORAL_TABLET | Freq: Every day | ORAL | Status: DC
  Filled 2012-11-06: qty 1

## 2012-11-06 MED ORDER — ACETAMINOPHEN 325 MG PO TABS
325.0000 mg | ORAL_TABLET | ORAL | Status: DC | PRN
  Administered 2012-11-07: 650 mg via ORAL
  Filled 2012-11-06: qty 2

## 2012-11-06 MED ORDER — ATORVASTATIN CALCIUM 20 MG PO TABS
20.0000 mg | ORAL_TABLET | Freq: Every day | ORAL | Status: DC
  Administered 2012-11-06 – 2012-11-12 (×7): 20 mg via ORAL
  Filled 2012-11-06 (×8): qty 1

## 2012-11-06 MED ORDER — FLEET ENEMA 7-19 GM/118ML RE ENEM
1.0000 | ENEMA | Freq: Once | RECTAL | Status: AC | PRN
  Filled 2012-11-06: qty 1

## 2012-11-06 MED ORDER — TRAMADOL HCL 50 MG PO TABS: 50.0000 mg | ORAL_TABLET | Freq: Four times a day (QID) | ORAL | Status: DC | PRN

## 2012-11-06 MED ORDER — GUAIFENESIN-DM 100-10 MG/5ML PO SYRP: 5.0000 mL | ORAL_SOLUTION | Freq: Four times a day (QID) | ORAL | Status: DC | PRN

## 2012-11-06 MED ORDER — POLYETHYLENE GLYCOL 3350 17 G PO PACK
17.0000 g | PACK | Freq: Every day | ORAL | Status: DC
  Filled 2012-11-06 (×2): qty 1

## 2012-11-06 MED ORDER — WARFARIN - PHARMACIST DOSING INPATIENT: Freq: Every day | Status: DC

## 2012-11-06 MED ORDER — POTASSIUM CITRATE ER 10 MEQ (1080 MG) PO TBCR
10.0000 meq | EXTENDED_RELEASE_TABLET | Freq: Two times a day (BID) | ORAL | Status: DC
  Administered 2012-11-06 – 2012-11-13 (×14): 10 meq via ORAL
  Filled 2012-11-06 (×20): qty 1

## 2012-11-06 MED ORDER — PHENOL 1.4 % MT LIQD: 1.0000 | OROMUCOSAL | Status: DC | PRN

## 2012-11-06 MED ORDER — TAMSULOSIN HCL 0.4 MG PO CAPS
0.4000 mg | ORAL_CAPSULE | Freq: Every day | ORAL | Status: DC
  Administered 2012-11-06 – 2012-11-12 (×7): 0.4 mg via ORAL
  Filled 2012-11-06 (×8): qty 1

## 2012-11-06 MED ORDER — PROCHLORPERAZINE 25 MG RE SUPP
12.5000 mg | Freq: Four times a day (QID) | RECTAL | Status: DC | PRN
  Filled 2012-11-06: qty 1

## 2012-11-06 MED ORDER — PROCHLORPERAZINE EDISYLATE 5 MG/ML IJ SOLN
5.0000 mg | Freq: Four times a day (QID) | INTRAMUSCULAR | Status: DC | PRN
  Filled 2012-11-06: qty 2

## 2012-11-06 MED ORDER — ALUM & MAG HYDROXIDE-SIMETH 200-200-20 MG/5ML PO SUSP: 30.0000 mL | ORAL | Status: DC | PRN

## 2012-11-06 MED ORDER — GLIPIZIDE 5 MG PO TABS
5.0000 mg | ORAL_TABLET | Freq: Every day | ORAL | Status: DC | PRN
  Filled 2012-11-06: qty 1

## 2012-11-06 MED ORDER — MENTHOL 3 MG MT LOZG
1.0000 | LOZENGE | OROMUCOSAL | Status: DC | PRN
  Filled 2012-11-06: qty 9

## 2012-11-06 NOTE — Plan of Care (Signed)
Overall Plan of Care [facility]) Patient Details Name: Leonard [name] MRN: [id number] DOB: [birth year]  Diagnosis:  Bilateral BKA  Co-morbidities: Diabetes, anticoagulation  Functional Problem List  Patient demonstrates impairments in the following areas: Balance, Bladder, Bowel, Edema, Endurance, Medication Management, Motor, Pain and Safety  Basic ADL's: eating, grooming, bathing, dressing and toileting Advanced ADL's: simple meal preparation and laundry  Transfers:  bed mobility, bed to chair, toilet, tub/shower, car and furniture Locomotion:  ambulation, wheelchair mobility and stairs  Additional Impairments:  None  Anticipated Outcomes Item Anticipated Outcome  Eating/Swallowing    Basic self-care  Mod I   Tolieting  Mod I   Bowel/Bladder  Manage bowel and bladder with Mod I assist  Transfers  Mod I  Locomotion  Mod I x150', Mod I x12 stairs with one handrail  Communication    Cognition    Pain  Pain managed with prn meds and non pharmaceutical measures so that pt able to tolerate therapy and complete ADLs  Safety/Judgment    Other     Therapy Plan: PT Intensity: Minimum of 1-2 x/day ,45 to 90 minutes PT Frequency: 5 out of 7 days PT Duration Estimated Length of Stay: 10-12 days OT Intensity: Minimum of 1-2 x/day, 45 to 90 minutes OT Frequency: 5 out of 7 days OT Duration/Estimated Length of Stay: 10-12 days      Team Interventions: Item RN PT OT SLP SW TR Other  Self Care/Advanced ADL Retraining   x      Neuromuscular Re-Education  x x      Therapeutic Activities  x x      UE/LE Strength Training/ROM  x x      UE/LE Coordination Activities  x x      Visual/Perceptual Remediation/Compensation         DME/Adaptive Equipment Instruction  x x      Therapeutic Exercise  x x      Balance/Vestibular Training  x x      Patient/Family Education x x x      Cognitive Remediation/Compensation         Functional Mobility Training  x x      Ambulation/Gait  Training x x       Stair Training  x       Wheelchair Propulsion/Positioning  x       Functional Tourist information centre manager Reintegration  x x      Dysphagia/Aspiration Film/video editor         Bladder Management x        Bowel Management x        Disease Management/Prevention         Pain Management x x       Medication Management x        Skin Care/Wound Management x        Splinting/Orthotics         Discharge Planning  x x      Psychosocial Support  x x                             Team Discharge Planning: Destination: PT-Home ,OT- Home , SLP-  Projected Follow-up: PT-Outpatient PT, OT-  None, SLP-  Projected Equipment Needs: PT-Rolling walker with 5" wheels, OT- 3 in 1 bedside comode;Other (comment), SLP-  Patient/family involved in discharge planning:  PT- Patient,  OT-Patient, SLP-   MD ELOS: 7 days Medical Rehab Prognosis:  Excellent Assessment: 57 year old male admitted for elective bilateral total knee replacement. Continues to require physical assistance for ADLs and mobility. Requires 24 7 rehabilitation RN and M.D. for wound care, diabetes management, pain management. CIR level PT and OT for ADLs, mobility, range of motion. Goals are for modified independent level    See Team Conference Notes for weekly updates to the plan of care

## 2012-11-06 NOTE — Progress Notes (Signed)
Anticoag: 57 yo male s/p bilateral TKA on 5/7, started on warfarin VTE prophylaxis 5/8. Lovenox 30mg  q12h started 5/9 12hrs after epidural discontinued. No bleeding reported. INR up to 1.39 today.  Patient was transferred from [facility] Goal of Therapy:  INR 2-3   Plan: Warfarin 10 mg PO x 1 booster dose today only at 1800.  Daily PT/INR Lovenox 30 mg SQ q 12 h until INR 2.0 or above, then discontinue.  After completion of 4 weeks warfarin, resume ASA 81 mg daily as per orders from ortho.

## 2012-11-06 NOTE — Progress Notes (Signed)
ANTICOAGULATION CONSULT NOTE - Follow Up Consult  Pharmacy Consult for Warfarin Indication: VTE prophylaxis  No Known Allergies  Patient Measurements: Height: 6' (182.9 cm) Weight: 257 lb (116.574 kg) IBW/kg (Calculated) : 77.6  Vital Signs: Temp: 98.5 F (36.9 C) (05/12 0513) Temp src: Oral (05/12 0513) BP: 113/63 mmHg (05/12 0513) Pulse Rate: 98 (05/12 0513)  Labs:  Recent Labs  11/04/12 0548 11/05/12 0450 11/06/12 0415  HGB 9.7* 9.6*  --   HCT 27.1* 27.1*  --   PLT 195 209  --   LABPROT 14.9 15.8* 16.7*  INR 1.19 1.29 1.39  CREATININE 1.00  --   --     Estimated Creatinine Clearance: 108.7 ml/min (by C-G formula based on Cr of 1).   Medications:  Scheduled:  . allopurinol  100 mg Oral QAC breakfast  . amLODipine  10 mg Oral QHS   And  . benazepril  40 mg Oral QHS  . atorvastatin  20 mg Oral QHS  . docusate sodium  100 mg Oral BID  . enoxaparin (LOVENOX) injection  30 mg Subcutaneous Q12H  . hydrochlorothiazide  25 mg Oral QAC breakfast  . insulin aspart  0-15 Units Subcutaneous TID WC  . linagliptin  5 mg Oral QAC breakfast   And  . metFORMIN  500 mg Oral BID WC  . potassium citrate  10 mEq Oral BID  . scopolamine  1 patch Transdermal Once  . [COMPLETED] sodium chloride  250 mL Intravenous Once  . tamsulosin  0.4 mg Oral QHS  . [COMPLETED] warfarin  10 mg Oral ONCE-1800  . Warfarin - Pharmacist Dosing Inpatient   Does not apply q1800   Infusions:  . 0.9 % NaCl with KCl 20 mEq / L Stopped (11/03/12 1120)  . bupivacaine (MARCAINE, SENSORCAINE) epidural 12 mL/hr at 11/03/12 0430  . naLOXone [facility]) adult infusion for PRURITIS    Inpatient warfarin doses: 5mg  (5/8), 7.5mg  (5/9), 7.5mg  (5/10), 10mg  (5/11).  Assessment: 57 yo male s/p bilateral TKA on 5/7, started on warfarin VTE prophylaxis 5/8. Lovenox 30mg  q12h started 5/9 12hrs after epidural discontinued.  INR rising slowly.  No bleeding reported.  Plans for discharge to SNF today  noted.  Goal of Therapy:  INR 2-3 Monitor platelets by anticoagulation protocol: Yes   Recommend:   Warfarin 10 mg PO x 1 booster dose today only at 1800.  PT/INR daily starting Tuesday 11/07/12.   Titrate warfarin to INR 2-3.  May decrease frequency of INR monitoring to 3x per week when INR is therapeutic and stable.  Lovenox 30 mg SQ q 12 h until INR 2.0 or above, then discontinue.  After completion of 4 weeks warfarin, resume ASA 81 mg daily as per orders from ortho.  Elie Goody, PharmD, BCPS Pager: [phone] 11/06/2012  8:18 AM

## 2012-11-06 NOTE — Progress Notes (Signed)
Physical Therapy Treatment Patient Details Name: BIJON MINEER MRN: [id number] DOB: [birth year] Today's Date: 11/06/2012 Time: 3086-5784 PT Time Calculation (min): 14 min  PT Assessment / Plan / Recommendation Comments on Treatment Session  POD # 5 B TKR.  Assisted pt OOB to amb to BR for attempted BM as pt was c/o beining uncomfortable. Spouse present during session.    Follow Up Recommendations  CIR     Does the patient have the potential to tolerate intense rehabilitation     Barriers to Discharge        Equipment Recommendations  Rolling walker with 5" wheels    Recommendations for Other Services    Frequency 7X/week   Plan Discharge plan remains appropriate;Frequency remains appropriate    Precautions / Restrictions Precautions Precautions: Knee;Fall Precaution Comments: Instructed pt on KI use for amb until able to perform 10 active SLR Ind Required Braces or Orthoses: Knee Immobilizer - Right;Knee Immobilizer - Left Knee Immobilizer - Right: Discontinue once straight leg raise with < 10 degree lag Knee Immobilizer - Left: Discontinue once straight leg raise with < 10 degree lag Restrictions Weight Bearing Restrictions: No RLE Weight Bearing: Weight bearing as tolerated LLE Weight Bearing: Weight bearing as tolerated   Pertinent Vitals/Pain C/o feeling uncomfortable and wanting to go to the bathroom    Mobility  Bed Mobility Bed Mobility: Sit to Supine Supine to Sit: 2: Max assist Details for Bed Mobility Assistance: Support of Bil LEs throughout process and increased time Transfers Transfers: Sit to Stand;Stand to Sit Sit to Stand: 1: +1 Total assist;From elevated surface;With upper extremity assist;From bed Sit to Stand: Patient Percentage: 70% Stand to Sit: 1: +2 Total assist;With upper extremity assist;With armrests;To toilet Details for Transfer Assistance: cues for hand placement and LE management and elevated surface due to B KI's and increase  time Ambulation/Gait Ambulation/Gait Assistance: 1: +2 Total assist Ambulation/Gait: Patient Percentage: 80% Ambulation Distance (Feet): 12 Feet Assistive device: Rolling walker Ambulation/Gait Assistance Details: amb from bed to BR with increased time and 25% VC's on safety with turns Gait Pattern: Step-to pattern;Trunk flexed Gait velocity: decreased     PT Goals                                                            progressing    Visit Information  Last PT Received On: 11/06/12 Assistance Needed: +2    Subjective Data  Subjective: I would like to go into the bathroom   Cognition    good   Balance   fair  End of Session PT - End of Session Equipment Utilized During Treatment: Gait belt;Right knee immobilizer;Left knee immobilizer Activity Tolerance: Patient tolerated treatment well Patient left: Other (comment) CPM Left Knee CPM Left Knee: Off CPM Right Knee CPM Right Knee: Off   Felecia Shelling  PTA [facility]  Acute  Rehab Pager      [phone]

## 2012-11-06 NOTE — Discharge Summary (Signed)
Physician Discharge Summary   Patient ID: Leonard [name] MRN: [id number] DOB/AGE: [birth year] 57 y.o.  Admit date: 11/01/2012 Discharge date: [id number]  Primary Diagnosis: Osteoarthritis Bilateral knee(s)  Admission Diagnoses:  Past Medical History  Diagnosis Date  . DM (diabetes mellitus)   . Other and unspecified hyperlipidemia   . Osteoarthritis   . Nephrolithiasis   . Anxiety and depression   . Normal nuclear stress test 03-2010  . BPH (benign prostatic hyperplasia)   . HTN (hypertension)     controlled  . OSA on CPAP     mild  . Anxiety   . Depression     mild  . Renal artery stenosis     "some due to ageing"  . Bleeding nose     Right nostril severe bleeding   Discharge Diagnoses:   Principal Problem:   OA (osteoarthritis) of knee Active Problems:   Postop Hyponatremia  Estimated body mass index is 34.85 kg/(m^2) as calculated from the following:   Height as of this encounter: 6' (1.829 m).   Weight as of this encounter: 116.574 kg (257 lb).  Procedure:  Procedure(s) (LRB): TOTAL KNEE BILATERAL (Bilateral)   Consults: rehabilitation medicine - [facility]  HPI: Leonard [name] is a 57 y.o. year old male with end stage OA of both knees with progressively worsening pain and dysfunction. He has significant pain, with activity and significant functional deficits. He has had extensive non-op management including analgesics, and home exercise program, but remains in significant pain with significant dysfunction. We discussed doing both knees in the same setting vs. One at a time. We discussed the pros and cons of each approach including procedure, risks and potential complications and he elects to proceed with bilateral TKA. He presents now for bilateral Total Knee Arthroplasty.   Laboratory Data: Admission on 11/01/2012  Component Date Value Range Status  . ABO/RH(D) 11/01/2012 O POS   Final  . Antibody Screen 11/01/2012 NEG   Final  . Sample  Expiration 11/01/2012 11/04/2012   Final  . Glucose-Capillary 11/01/2012 154* 70 - 99 mg/dL Final  . ABO/RH(D) 78/46/9629 O POS   Final  . Glucose-Capillary 11/01/2012 179* 70 - 99 mg/dL Final  . Comment 1 52/84/1324 Documented in Chart   Final  . Comment 2 11/01/2012 Notify RN   Final  . WBC 11/02/2012 13.8* 4.0 - 10.5 K/uL Final  . RBC 11/02/2012 3.96* 4.22 - 5.81 MIL/uL Final  . Hemoglobin 11/02/2012 11.3* 13.0 - 17.0 g/dL Final  . HCT 40/03/2724 32.9* 39.0 - 52.0 % Final  . MCV 11/02/2012 83.1  78.0 - 100.0 fL Final  . MCH 11/02/2012 28.5  26.0 - 34.0 pg Final  . MCHC 11/02/2012 34.3  30.0 - 36.0 g/dL Final  . RDW 36/64/4034 12.9  11.5 - 15.5 % Final  . Platelets 11/02/2012 189  150 - 400 K/uL Final  . Sodium 11/02/2012 134* 135 - 145 mEq/L Final  . Potassium 11/02/2012 4.0  3.5 - 5.1 mEq/L Final  . Chloride 11/02/2012 100  96 - 112 mEq/L Final  . CO2 11/02/2012 24  19 - 32 mEq/L Final  . Glucose, Bld 11/02/2012 211* 70 - 99 mg/dL Final  . BUN 74/25/9563 22  6 - 23 mg/dL Final  . Creatinine, Ser 11/02/2012 0.90  0.50 - 1.35 mg/dL Final  . Calcium 87/56/4332 8.4  8.4 - 10.5 mg/dL Final  . GFR calc non Af Amer 11/02/2012 >90  >90 mL/min Final  . GFR calc Af  Amer 11/02/2012 >90  >90 mL/min Final   Comment:                                 The eGFR has been calculated                          using the CKD EPI equation.                          This calculation has not been                          validated in all clinical                          situations.                          eGFR's persistently                          <90 mL/min signify                          possible Chronic Kidney Disease.  [name] Prothrombin Time 11/02/2012 16.3* 11.6 - 15.2 seconds Final  . INR 11/02/2012 1.34  0.00 - 1.49 Final  . Glucose-Capillary 11/01/2012 203* 70 - 99 mg/dL Final  . Comment 1 78/46/9629 Notify RN   Final  . Comment 2 11/01/2012 Documented in Chart   Final  . Glucose-Capillary  11/01/2012 264* 70 - 99 mg/dL Final  . Glucose-Capillary 11/02/2012 187* 70 - 99 mg/dL Final  . Glucose-Capillary 11/02/2012 159* 70 - 99 mg/dL Final  . Glucose-Capillary 11/02/2012 185* 70 - 99 mg/dL Final  . WBC 52/84/1324 13.8* 4.0 - 10.5 K/uL Final  . RBC 11/03/2012 3.33* 4.22 - 5.81 MIL/uL Final  . Hemoglobin 11/03/2012 9.6* 13.0 - 17.0 g/dL Final  . HCT 40/03/2724 27.8* 39.0 - 52.0 % Final  . MCV 11/03/2012 83.5  78.0 - 100.0 fL Final  . MCH 11/03/2012 28.8  26.0 - 34.0 pg Final  . MCHC 11/03/2012 34.5  30.0 - 36.0 g/dL Final  . RDW 36/64/4034 13.2  11.5 - 15.5 % Final  . Platelets 11/03/2012 172  150 - 400 K/uL Final  . Sodium 11/03/2012 137  135 - 145 mEq/L Final  . Potassium 11/03/2012 4.4  3.5 - 5.1 mEq/L Final  . Chloride 11/03/2012 103  96 - 112 mEq/L Final  . CO2 11/03/2012 26  19 - 32 mEq/L Final  . Glucose, Bld 11/03/2012 160* 70 - 99 mg/dL Final  . BUN 74/25/9563 29* 6 - 23 mg/dL Final  . Creatinine, Ser 11/03/2012 1.00  0.50 - 1.35 mg/dL Final  . Calcium 87/56/4332 8.4  8.4 - 10.5 mg/dL Final  . GFR calc non Af Amer 11/03/2012 82* >90 mL/min Final  . GFR calc Af Amer 11/03/2012 >90  >90 mL/min Final   Comment:                                 The eGFR has been calculated  using the CKD EPI equation.                          This calculation has not been                          validated in all clinical                          situations.                          eGFR's persistently                          <90 mL/min signify                          possible Chronic Kidney Disease.  [name] Prothrombin Time 11/03/2012 16.0* 11.6 - 15.2 seconds Final  . INR 11/03/2012 1.31  0.00 - 1.49 Final  . Glucose-Capillary 11/02/2012 169* 70 - 99 mg/dL Final  . Glucose-Capillary 11/03/2012 166* 70 - 99 mg/dL Final  . Glucose-Capillary 11/03/2012 150* 70 - 99 mg/dL Final  . WBC 16/03/9603 12.4* 4.0 - 10.5 K/uL Final  . RBC 11/04/2012 3.25* 4.22 - 5.81  MIL/uL Final  . Hemoglobin 11/04/2012 9.7* 13.0 - 17.0 g/dL Final  . HCT 54/02/8118 27.1* 39.0 - 52.0 % Final  . MCV 11/04/2012 83.4  78.0 - 100.0 fL Final  . MCH 11/04/2012 29.8  26.0 - 34.0 pg Final  . MCHC 11/04/2012 35.8  30.0 - 36.0 g/dL Final  . RDW 14/78/2956 13.1  11.5 - 15.5 % Final  . Platelets 11/04/2012 195  150 - 400 K/uL Final  . Sodium 11/04/2012 131* 135 - 145 mEq/L Final  . Potassium 11/04/2012 3.8  3.5 - 5.1 mEq/L Final  . Chloride 11/04/2012 94* 96 - 112 mEq/L Final   Comment: DELTA CHECK NOTED                          REPEATED TO VERIFY  . CO2 11/04/2012 28  19 - 32 mEq/L Final  . Glucose, Bld 11/04/2012 190* 70 - 99 mg/dL Final  . BUN 21/30/8657 20  6 - 23 mg/dL Final  . Creatinine, Ser 11/04/2012 1.00  0.50 - 1.35 mg/dL Final  . Calcium 84/69/6295 8.2* 8.4 - 10.5 mg/dL Final  . GFR calc non Af Amer 11/04/2012 82* >90 mL/min Final  . GFR calc Af Amer 11/04/2012 >90  >90 mL/min Final   Comment:                                 The eGFR has been calculated                          using the CKD EPI equation.                          This calculation has not been                          validated in all clinical  situations.                          eGFR's persistently                          <90 mL/min signify                          possible Chronic Kidney Disease.  [name] Prothrombin Time 11/04/2012 14.9  11.6 - 15.2 seconds Final  . INR 11/04/2012 1.19  0.00 - 1.49 Final  . Glucose-Capillary 11/03/2012 161* 70 - 99 mg/dL Final  . Glucose-Capillary 11/03/2012 160* 70 - 99 mg/dL Final  . Glucose-Capillary 11/04/2012 162* 70 - 99 mg/dL Final  . Glucose-Capillary 11/04/2012 175* 70 - 99 mg/dL Final  . Comment 1 14/78/2956 Notify RN   Final  . WBC 11/05/2012 11.8* 4.0 - 10.5 K/uL Final  . RBC 11/05/2012 3.24* 4.22 - 5.81 MIL/uL Final  . Hemoglobin 11/05/2012 9.6* 13.0 - 17.0 g/dL Final  . HCT 21/30/8657 27.1* 39.0 - 52.0 % Final  . MCV  11/05/2012 83.6  78.0 - 100.0 fL Final  . MCH 11/05/2012 29.6  26.0 - 34.0 pg Final  . MCHC 11/05/2012 35.4  30.0 - 36.0 g/dL Final  . RDW 84/69/6295 12.8  11.5 - 15.5 % Final  . Platelets 11/05/2012 209  150 - 400 K/uL Final  . Prothrombin Time 11/05/2012 15.8* 11.6 - 15.2 seconds Final  . INR 11/05/2012 1.29  0.00 - 1.49 Final  . Glucose-Capillary 11/04/2012 179* 70 - 99 mg/dL Final  . Comment 1 28/41/3244 Notify RN   Final  . Glucose-Capillary 11/04/2012 220* 70 - 99 mg/dL Final  . Comment 1 06/30/7251 Notify RN   Final  . Comment 2 11/04/2012 Documented in Chart   Final  . Glucose-Capillary 11/05/2012 199* 70 - 99 mg/dL Final  . Glucose-Capillary 11/05/2012 188* 70 - 99 mg/dL Final  . Prothrombin Time 11/06/2012 16.7* 11.6 - 15.2 seconds Final  . INR 11/06/2012 1.39  0.00 - 1.49 Final  . Glucose-Capillary 11/05/2012 183* 70 - 99 mg/dL Final  . Glucose-Capillary 11/05/2012 230* 70 - 99 mg/dL Final  . Comment 1 66/44/0347 Notify RN   Final  . Comment 2 11/05/2012 Documented in Chart   Final  Hospital Outpatient Visit on 10/20/2012  Component Date Value Range Status  . MRSA, PCR 10/20/2012 NEGATIVE  NEGATIVE Final  . Staphylococcus aureus 10/20/2012 NEGATIVE  NEGATIVE Final   Comment:                                 The Xpert SA Assay (FDA                          approved for NASAL specimens                          in patients over 62 years of age),                          is one component of                          a comprehensive surveillance  program.  Test performance has                          been validated by [facility] for patients greater                          than or equal to 3 year old.                          It is not intended                          to diagnose infection nor to                          guide or monitor treatment.  [name] aPTT 10/20/2012 27  24 - 37 seconds Final  . WBC 10/20/2012 8.7  4.0 -  10.5 K/uL Final  . RBC 10/20/2012 4.92  4.22 - 5.81 MIL/uL Final  . Hemoglobin 10/20/2012 14.6  13.0 - 17.0 g/dL Final  . HCT 16/03/9603 41.4  39.0 - 52.0 % Final  . MCV 10/20/2012 84.1  78.0 - 100.0 fL Final  . MCH 10/20/2012 29.7  26.0 - 34.0 pg Final  . MCHC 10/20/2012 35.3  30.0 - 36.0 g/dL Final  . RDW 54/02/8118 12.9  11.5 - 15.5 % Final  . Platelets 10/20/2012 260  150 - 400 K/uL Final  . Sodium 10/20/2012 139  135 - 145 mEq/L Final  . Potassium 10/20/2012 3.7  3.5 - 5.1 mEq/L Final  . Chloride 10/20/2012 102  96 - 112 mEq/L Final  . CO2 10/20/2012 26  19 - 32 mEq/L Final  . Glucose, Bld 10/20/2012 91  70 - 99 mg/dL Final  . BUN 14/78/2956 21  6 - 23 mg/dL Final  . Creatinine, Ser 10/20/2012 1.07  0.50 - 1.35 mg/dL Final  . Calcium 21/30/8657 9.5  8.4 - 10.5 mg/dL Final  . Total Protein 10/20/2012 7.6  6.0 - 8.3 g/dL Final  . Albumin 84/69/6295 4.2  3.5 - 5.2 g/dL Final  . AST 28/41/3244 24  0 - 37 U/L Final  . ALT 10/20/2012 35  0 - 53 U/L Final  . Alkaline Phosphatase 10/20/2012 56  39 - 117 U/L Final  . Total Bilirubin 10/20/2012 0.3  0.3 - 1.2 mg/dL Final  . GFR calc non Af Amer 10/20/2012 76* >90 mL/min Final  . GFR calc Af Amer 10/20/2012 88* >90 mL/min Final   Comment:                                 The eGFR has been calculated                          using the CKD EPI equation.                          This calculation has not been  validated in all clinical                          situations.                          eGFR's persistently                          <90 mL/min signify                          possible Chronic Kidney Disease.  [name] Prothrombin Time 10/20/2012 13.5  11.6 - 15.2 seconds Final  . INR 10/20/2012 1.04  0.00 - 1.49 Final  . Color, Urine 10/20/2012 YELLOW  YELLOW Final  . APPearance 10/20/2012 CLEAR  CLEAR Final  . Specific Gravity, Urine 10/20/2012 1.012  1.005 - 1.030 Final  . pH 10/20/2012 7.0  5.0 - 8.0 Final  .  Glucose, UA 10/20/2012 NEGATIVE  NEGATIVE mg/dL Final  . Hgb urine dipstick 10/20/2012 NEGATIVE  NEGATIVE Final  . Bilirubin Urine 10/20/2012 NEGATIVE  NEGATIVE Final  . Ketones, ur 10/20/2012 NEGATIVE  NEGATIVE mg/dL Final  . Protein, ur 13/01/6577 NEGATIVE  NEGATIVE mg/dL Final  . Urobilinogen, UA 10/20/2012 0.2  0.0 - 1.0 mg/dL Final  . Nitrite 46/96/2952 NEGATIVE  NEGATIVE Final  . Leukocytes, UA 10/20/2012 NEGATIVE  NEGATIVE Final   MICROSCOPIC NOT DONE ON URINES WITH NEGATIVE PROTEIN, BLOOD, LEUKOCYTES, NITRITE, OR GLUCOSE <1000 mg/dL.  Office Visit on 10/04/2012  Component Date Value Range Status  . Sodium 10/04/2012 139  135 - 145 mEq/L Final  . Potassium 10/04/2012 4.0  3.5 - 5.1 mEq/L Final  . Chloride 10/04/2012 106  96 - 112 mEq/L Final  . CO2 10/04/2012 24  19 - 32 mEq/L Final  . Glucose, Bld 10/04/2012 149* 70 - 99 mg/dL Final  . BUN 84/13/2440 25* 6 - 23 mg/dL Final  . Creatinine, Ser 10/04/2012 1.0  0.4 - 1.5 mg/dL Final  . Total Bilirubin 10/04/2012 0.7  0.3 - 1.2 mg/dL Final  . Alkaline Phosphatase 10/04/2012 51  39 - 117 U/L Final  . AST 10/04/2012 21  0 - 37 U/L Final  . ALT 10/04/2012 27  0 - 53 U/L Final  . Total Protein 10/04/2012 7.8  6.0 - 8.3 g/dL Final  . Albumin 04/24/2535 4.5  3.5 - 5.2 g/dL Final  . Calcium 64/40/3474 9.3  8.4 - 10.5 mg/dL Final  . GFR 25/95/6387 78.33  >60.00 mL/min Final  . WBC 10/04/2012 6.6  4.5 - 10.5 K/uL Final  . RBC 10/04/2012 4.97  4.22 - 5.81 Mil/uL Final  . Hemoglobin 10/04/2012 14.7  13.0 - 17.0 g/dL Final  . HCT 56/43/3295 43.6  39.0 - 52.0 % Final  . MCV 10/04/2012 87.7  78.0 - 100.0 fl Final  . MCHC 10/04/2012 33.8  30.0 - 36.0 g/dL Final  . RDW 18/84/1660 13.7  11.5 - 14.6 % Final  . Platelets 10/04/2012 275.0  150.0 - 400.0 K/uL Final  . Neutrophils Relative 10/04/2012 57.0  43.0 - 77.0 % Final  . Lymphocytes Relative 10/04/2012 29.0  12.0 - 46.0 % Final  . Monocytes Relative 10/04/2012 9.1  3.0 - 12.0 % Final  .  Eosinophils Relative 10/04/2012 4.6  0.0 - 5.0 % Final  . Basophils Relative 10/04/2012 0.3  0.0 - 3.0 % Final  . Neutro  Abs 10/04/2012 3.7  1.4 - 7.7 K/uL Final  . Lymphs Abs 10/04/2012 1.9  0.7 - 4.0 K/uL Final  . Monocytes Absolute 10/04/2012 0.6  0.1 - 1.0 K/uL Final  . Eosinophils Absolute 10/04/2012 0.3  0.0 - 0.7 K/uL Final  . Basophils Absolute 10/04/2012 0.0  0.0 - 0.1 K/uL Final  . Hemoglobin A1C 10/04/2012 6.4  4.6 - 6.5 % Final   Glycemic Control Guidelines for People with Diabetes:Non Diabetic:  <6%Goal of Therapy: <7%Additional Action Suggested:  >8%      X-Rays:Dg Chest 2 View  10/20/2012  *RADIOLOGY REPORT*  Clinical Data: Hypertension  CHEST - 2 VIEW  Comparison: June 14, 2007.  Findings: Cardiomediastinal silhouette appears normal.  No acute pulmonary disease is noted.  Bony thorax is intact.  IMPRESSION: No acute cardiopulmonary abnormality seen.   Original Report Authenticated By: Lupita Raider.,  M.D.     EKG: Orders placed during the hospital encounter of 11/01/12  . EKG 12-LEAD  . EKG 12-LEAD  . EKG 12-LEAD  . EKG 12-LEAD  . EKG 12-LEAD  . EKG 12-LEAD     Hospital Course: Patient was admitted to [facility] and taken to the OR and underwent the above stated procedure well without complications.  Patient tolerated the procedure well and was later transferred to the recovery room and then to the orthopaedic floor for postoperative care. Anesthesia was consulted postoperatively to place an epidural in for postoperative pain management. The patient was also given PO and IV analgesics for pain control following their surgery.  They were given 24 hours of postoperative antibiotics and started on DVT prophylaxis in the form of Lovenox and Coumadin after the epidural had been removed.   PT and OT were ordered for total joint protocol.  Discharge planning consulted to help with postop disposition and equipment needs.  Patient had a decent night on the evening of  surgery and started to get up OOB with therapy on day one. Hemovac drains were pulled without difficulty on day one. Patient seen in rounds with Dr. Lequita Halt. Went over the events the night before. Sounds like he had vagal episodes. He did get blood back from the autovacs. Anesthesia came up and checked on patient and epidural. He was doing fairly well that morning on day one. Continued to monitor pressures.  [facility] services were consulted postop.  Started to get up more with therapy into day two.  Dressings were changed on day two and both incisions were healing well. His right leg was more numb and weak that day, likely due to the epidural placement and concentration. The epidural was removed without difficulty by Anesthesia on day two and he leg improved.  By day three, the patient started to show progress with therapy.  They continued to receive therapy each day for continued total knee protocol.  The incisions were healing well. There was a possibility for him to go over on POD 3 but had some issues with soft pressures when getting up with therapy. He was kept over the weekend.  They continued to progress on day four and day five at which time the patient was seen in rounds and was ready to go to CIR as per Dr. Lequita Halt.   Discharge Medications: Current Discharge Medication List    START taking these medications   Details  methocarbamol (ROBAXIN) 500 MG tablet Take 1 tablet (500 mg total) by mouth every 6 (six) hours as needed. Qty: 80 tablet, Refills: 1  oxyCODONE (OXY IR/ROXICODONE) 5 MG immediate release tablet Take 1-4 tablets (5-20 mg total) by mouth every 3 (three) hours as needed. Qty: 80 tablet, Refills: 0    traMADol (ULTRAM) 50 MG tablet Take 1-2 tablets (50-100 mg total) by mouth every 6 (six) hours as needed. Qty: 80 tablet, Refills: 1      CONTINUE these medications which have NOT CHANGED   Details  allopurinol (ZYLOPRIM) 100 MG tablet Take 100 mg by mouth daily  before breakfast.     amLODipine-benazepril (LOTREL) 10-40 MG per capsule Take 1 capsule by mouth at bedtime.    atorvastatin (LIPITOR) 20 MG tablet Take 20 mg by mouth at bedtime.    clonazePAM (KLONOPIN) 0.5 MG tablet Take 0.25 mg by mouth 2 (two) times daily as needed for anxiety. TAKES 1/2    glipiZIDE (GLUCOTROL) 10 MG tablet Take 5 mg by mouth daily as needed (if sugar over 140-160  takes 1/2 tablet).    hydrochlorothiazide (HYDRODIURIL) 25 MG tablet Take 25 mg by mouth daily before breakfast.    sitaGLIPtan-metformin (JANUMET) 50-500 MG per tablet Take 1 tablet by mouth 2 (two) times daily with a meal.    tamsulosin (FLOMAX) 0.4 MG CAPS Take 0.4 mg by mouth at bedtime.    fish oil-omega-3 fatty acids 1000 MG capsule Take 3 g by mouth 2 (two) times daily.     Melatonin 5 MG TABS Take 1 tablet by mouth at bedtime as needed (sleep).    Multiple Vitamin (MULTIVITAMIN WITH MINERALS) TABS Take 1 tablet by mouth daily.    potassium citrate (UROCIT-K) 10 MEQ (1080 MG) SR tablet Take 10 mEq by mouth 2 (two) times daily.       Take Coumadin for 4 weeks and then discontinue.  The dose may need to be adjusted based upon the INR.  Please follow the INR and titrate Coumadin dose for a therapeutic range between 2.0 and 3.0 INR.  After completing the 4 weeks of Coumadin, the patient may stop the Coumadin and resume their 81 mg Aspirin daily.  Continue Lovenox injections until the INR is therapeutic at or greater than 2.0.  When INR reaches the therapeutic level of equal to or greater than 2.0, the patient may discontinue the Lovenox injections.   Diet: Cardiac diet Activity:WBAT Follow-up:in 2 weeks Disposition - Rehab Discharged Condition: good   Discharge Orders   Future Appointments Provider Department Dept Phone   11/06/2012 8:30 AM Jackey Loge, PT [facility]  [address]  [facility] [phone]   11/06/2012 10:30 AM Daneil Dan, OT [facility]  [address]  [place] [phone]   11/06/2012 1:45 PM Daneil Dan, OT [facility]  [address]  [place] [phone]   11/06/2012 2:15 PM Jackey Loge, PT [facility]  [address]  REHAB [phone]   01/17/2013 9:00 AM Wanda Plump, MD [facility] at  [place] [phone]   01/19/2013 1:45 PM Rollene Rotunda, MD [facility] Main Office [place]) [phone]   Future Orders Complete By Expires     Call MD / Call 911  As directed     Comments:      If you experience chest pain or shortness of breath, CALL 911 and be transported to the hospital emergency room.  If you develope a fever above 101 F, pus (white drainage) or increased drainage or redness at the wound, or calf pain, call your surgeon's office.    Change  dressing  As directed     Comments:      Change dressing daily with sterile 4 x 4 inch gauze dressing and apply TED hose. Do not submerge the incision under water.    Constipation Prevention  As directed     Comments:      Drink plenty of fluids.  Prune juice may be helpful.  You may use a stool softener, such as Colace (over the counter) 100 mg twice a day.  Use MiraLax (over the counter) for constipation as needed.    Diet - low sodium heart healthy  As directed     Discharge instructions  As directed     Comments:      Pick up stool softner and laxative for home. Do not submerge incision under water. May shower. Continue to use ice for pain and swelling from surgery.  Take Coumadin for four weeks and then discontinue.  The dose may need to be adjusted based upon the INR.  Please follow the INR and titrate Coumadin dose for a therapeutic range between 2.0 and 3.0 INR.   Continue Lovenox injections until the INR is therapeutic at or greater than 2.0.  When INR reaches the therapeutic level of equal to or greater than 2.0, the patient may discontinue the Lovenox injections.    Do not put a pillow under the  knee. Place it under the heel.  As directed     Do not sit on low chairs, stoools or toilet seats, as it may be difficult to get up from low surfaces  As directed     Driving restrictions  As directed     Comments:      No driving until released by the physician.    Increase activity slowly as tolerated  As directed     Lifting restrictions  As directed     Comments:      No lifting until released by the physician.    Patient may shower  As directed     Comments:      You may shower without a dressing once there is no drainage.  Do not wash over the wound.  If drainage remains, do not shower until drainage stops.    TED hose  As directed     Comments:      Use stockings (TED hose) for 3 weeks on both leg(s).  You may remove them at night for sleeping.    Weight bearing as tolerated  As directed         Medication List    TAKE these medications       allopurinol 100 MG tablet  Commonly known as:  ZYLOPRIM  Take 100 mg by mouth daily before breakfast.     amLODipine-benazepril 10-40 MG per capsule  Commonly known as:  LOTREL  Take 1 capsule by mouth at bedtime.     atorvastatin 20 MG tablet  Commonly known as:  LIPITOR  Take 20 mg by mouth at bedtime.     clonazePAM 0.5 MG tablet  Commonly known as:  KLONOPIN  Take 0.25 mg by mouth 2 (two) times daily as needed for anxiety. TAKES 1/2     fish oil-omega-3 fatty acids 1000 MG capsule  Take 3 g by mouth 2 (two) times daily.     glipiZIDE 10 MG tablet  Commonly known as:  GLUCOTROL  Take 5 mg by mouth daily as needed (if sugar over 140-160  takes 1/2 tablet).  hydrochlorothiazide 25 MG tablet  Commonly known as:  HYDRODIURIL  Take 25 mg by mouth daily before breakfast.     Melatonin 5 MG Tabs  Take 1 tablet by mouth at bedtime as needed (sleep).     methocarbamol 500 MG tablet  Commonly known as:  ROBAXIN  Take 1 tablet (500 mg total) by mouth every 6 (six) hours as needed.     multivitamin with minerals Tabs    Take 1 tablet by mouth daily.     oxyCODONE 5 MG immediate release tablet  Commonly known as:  Oxy IR/ROXICODONE  Take 1-4 tablets (5-20 mg total) by mouth every 3 (three) hours as needed.     potassium citrate 10 MEQ (1080 MG) SR tablet  Commonly known as:  UROCIT-K  Take 10 mEq by mouth 2 (two) times daily.     sitaGLIPtan-metformin 50-500 MG per tablet  Commonly known as:  JANUMET  Take 1 tablet by mouth 2 (two) times daily with a meal.     tamsulosin 0.4 MG Caps  Commonly known as:  FLOMAX  Take 0.4 mg by mouth at bedtime.     traMADol 50 MG tablet  Commonly known as:  ULTRAM  Take 1-2 tablets (50-100 mg total) by mouth every 6 (six) hours as needed.           Follow-up Information   Follow up with [name], MD. Schedule an appointment as soon as possible for a visit on 11/14/2012. (Call [phone] Monday to make the appointment)    Contact information:   [address] [place] [address] [phone]       Signed: Patrica Duel 11/06/2012, 7:30 AM

## 2012-11-06 NOTE — Progress Notes (Signed)
Pt. Has home CPAP. RT checked equipment for frayed wires. None noted. Passed along to RN to have BIO med check equipment when available.

## 2012-11-06 NOTE — Progress Notes (Signed)
Pt for d/c to In pt. Rehab (CIR) today. IV d/c'd. Pt got up & ambulated few steps with PT x 2 assist and walker. Took pain med & Robaxin prior to PT. Wife at bedside to assist with d/c Dressing CDI to bil. Knee. Ice pack used to both knees. Dulcolax supp given since Miralax & Colace not worked for pt yet. Awaiting for transport.

## 2012-11-06 NOTE — Progress Notes (Signed)
   Subjective: 5 Days Post-Op Procedure(s) (LRB): TOTAL KNEE BILATERAL (Bilateral) Patient reports pain as mild.   No further complaints of dizziness Plan is to go Rehab after hospital stay.  Objective: Vital signs in last 24 hours: Temp:  [98.5 F (36.9 C)-100.4 F (38 C)] 98.5 F (36.9 C) (05/12 0513) Pulse Rate:  [98-111] 98 (05/12 0513) Resp:  [16-20] 20 (05/12 0513) BP: (113-121)/(63-78) 113/63 mmHg (05/12 0513) SpO2:  [95 %-97 %] 97 % (05/12 0513)  Intake/Output from previous day:  Intake/Output Summary (Last 24 hours) at 11/06/12 0704 Last data filed at 11/06/12 0513  Gross per 24 hour  Intake   1920 ml  Output   3730 ml  Net  -1810 ml    Intake/Output this shift:    Labs:  Recent Labs  11/04/12 0548 11/05/12 0450  HGB 9.7* 9.6*    Recent Labs  11/04/12 0548 11/05/12 0450  WBC 12.4* 11.8*  RBC 3.25* 3.24*  HCT 27.1* 27.1*  PLT 195 209    Recent Labs  11/04/12 0548  NA 131*  K 3.8  CL 94*  CO2 28  BUN 20  CREATININE 1.00  GLUCOSE 190*  CALCIUM 8.2*    Recent Labs  11/05/12 0450 11/06/12 0415  INR 1.29 1.39    EXAM General - Patient is Alert, Appropriate and Oriented Extremity - Neurologically intact Neurovascular intact Incision: dressing C/D/I No cellulitis present Compartment soft Dressing/Incision - clean, dry, no drainage Motor Function - intact, moving foot and toes well on exam.   Past Medical History  Diagnosis Date  . DM (diabetes mellitus)   . Other and unspecified hyperlipidemia   . Osteoarthritis   . Nephrolithiasis   . Anxiety and depression   . Normal nuclear stress test 03-2010  . BPH (benign prostatic hyperplasia)   . HTN (hypertension)     controlled  . OSA on CPAP     mild  . Anxiety   . Depression     mild  . Renal artery stenosis     "some due to ageing"  . Bleeding nose     Right nostril severe bleeding    Assessment/Plan: 5 Days Post-Op Procedure(s) (LRB): TOTAL KNEE BILATERAL  (Bilateral) Principal Problem:   OA (osteoarthritis) of knee Active Problems:   Postop Hyponatremia   Up with therapy D/C IV fluids D/C to rehab today  DVT Prophylaxis - Coumadin Weight-Bearing as tolerated to bilateral leg  Leonard [name] V 11/06/2012, 7:04 AM

## 2012-11-06 NOTE — Progress Notes (Signed)
Pt for d/c to CIR today. IV d/c'd. Dressing CDI to bil. Knees-gauze/tape. Ambulated with PT X2 person assist using walker. Dulcolax supp given since pt was not able to have any BM with Miralax & Colace. Pain med & Robaxin given prior to PT. Wife at bedside to assist with d/c. [facility] to transport to CIR.

## 2012-11-06 NOTE — Progress Notes (Signed)
Physical Therapy Treatment Patient Details Name: Leonard [name] MRN: [id number] DOB: 1956-04-09 Today's Date: 11/06/2012 Time: 4098-1191 PT Time Calculation (min): 13 min  PT Assessment / Plan / Recommendation Comments on Treatment Session  POD # 5 B TKR.  Assisted pt out of bathroom and amb in hallway .  Plans to D/C to CIR today.    Follow Up Recommendations  CIR     Does the patient have the potential to tolerate intense rehabilitation     Barriers to Discharge        Equipment Recommendations  Rolling walker with 5" wheels    Recommendations for Other Services    Frequency 7X/week   Plan Discharge plan remains appropriate;Frequency remains appropriate    Precautions / Restrictions Precautions Precautions: Knee;Fall Precaution Comments: Instructed pt on KI use for amb until able to perform 10 active SLR Ind Required Braces or Orthoses: Knee Immobilizer - Right;Knee Immobilizer - Left Knee Immobilizer - Right: Discontinue once straight leg raise with < 10 degree lag Knee Immobilizer - Left: Discontinue once straight leg raise with < 10 degree lag Restrictions Weight Bearing Restrictions: No RLE Weight Bearing: Weight bearing as tolerated LLE Weight Bearing: Weight bearing as tolerated   Pertinent Vitals/Pain C/o 3/10 L knee and 2/10 R knee    Mobility  Bed Mobility Bed Mobility: Sit to Supine Supine to Sit: 2: Max assist Details for Bed Mobility Assistance: Support of Bil LEs throughout process and increased time Transfers Transfers: Sit to Stand;Stand to Sit Sit to Stand: 1: +1 Total assist;From elevated surface;With upper extremity assist;From bed Sit to Stand: Patient Percentage: 70% Stand to Sit: 1: +2 Total assist;With upper extremity assist;With armrests;To toilet Details for Transfer Assistance: cues for hand placement and LE management and elevated surface due to B KI's and increase time Ambulation/Gait Ambulation/Gait Assistance: 1: +2 Total  assist Ambulation/Gait: Patient Percentage: 80% Ambulation Distance (Feet): 75 Feet Assistive device: Rolling walker Ambulation/Gait Assistance Details: amb from bed to BR with increased time and 25% VC's on safety with turns Gait Pattern: Step-to pattern;Trunk flexed Gait velocity: decreased    PT Goals                                                              progressing    Visit Information  Last PT Received On: 11/06/12 Assistance Needed: +2    Subjective Data  Subjective: I would like to go into the bathroom   Cognition    good   Balance   fair  End of Session PT - End of Session Equipment Utilized During Treatment: Gait belt;Right knee immobilizer;Left knee immobilizer Activity Tolerance: Patient tolerated treatment well Patient left: in recliner CPM Left Knee CPM Left Knee: Off CPM Right Knee CPM Right Knee: Off   Felecia Shelling  PTA [facility]  Acute  Rehab Pager      [phone]
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
## 2012-11-06 NOTE — Progress Notes (Signed)
Rehab admissions - Patient was held on acute over the weekend.  Now ready for admission to acute inpatient rehab.  Bed available and can admit to inpatient rehab today.  Call me for questions.  #[phone]

## 2012-11-07 ENCOUNTER — Inpatient Hospital Stay (HOSPITAL_COMMUNITY): Payer: BC Managed Care – PPO

## 2012-11-07 ENCOUNTER — Inpatient Hospital Stay (HOSPITAL_COMMUNITY): Payer: BC Managed Care – PPO | Admitting: *Deleted

## 2012-11-07 DIAGNOSIS — Z96659 Presence of unspecified artificial knee joint: Secondary | ICD-10-CM

## 2012-11-07 DIAGNOSIS — D62 Acute posthemorrhagic anemia: Secondary | ICD-10-CM

## 2012-11-07 DIAGNOSIS — M1710 Unilateral primary osteoarthritis, unspecified knee: Secondary | ICD-10-CM

## 2012-11-07 LAB — GLUCOSE, CAPILLARY: Glucose-Capillary: 235 mg/dL — ABNORMAL HIGH (ref 70–99)

## 2012-11-07 LAB — PROTIME-INR
INR: 1.5 — ABNORMAL HIGH (ref 0.00–1.49)
Prothrombin Time: 17.7 seconds — ABNORMAL HIGH (ref 11.6–15.2)

## 2012-11-07 LAB — URINE CULTURE

## 2012-11-07 MED ORDER — OXYCODONE HCL 5 MG PO TABS
10.0000 mg | ORAL_TABLET | ORAL | Status: DC | PRN
  Administered 2012-11-07 – 2012-11-13 (×21): 10 mg via ORAL
  Filled 2012-11-07 (×21): qty 2

## 2012-11-07 MED ORDER — GLUCERNA SHAKE PO LIQD
120.0000 mL | Freq: Four times a day (QID) | ORAL | Status: DC
  Administered 2012-11-07 – 2012-11-13 (×20): 120 mL via ORAL

## 2012-11-07 MED ORDER — SODIUM CHLORIDE 0.9 % IV SOLN
INTRAVENOUS | Status: DC
  Administered 2012-11-07 – 2012-11-08 (×2): via INTRAVENOUS

## 2012-11-07 MED ORDER — WARFARIN SODIUM 10 MG PO TABS
10.0000 mg | ORAL_TABLET | Freq: Once | ORAL | Status: AC
  Administered 2012-11-07: 10 mg via ORAL
  Filled 2012-11-07: qty 1

## 2012-11-07 MED ORDER — SODIUM CHLORIDE 0.9 % IV SOLN: INTRAVENOUS | Status: DC

## 2012-11-07 MED ORDER — MORPHINE SULFATE 15 MG PO TABS
15.0000 mg | ORAL_TABLET | ORAL | Status: DC | PRN
  Administered 2012-11-07: 15 mg via ORAL
  Filled 2012-11-07: qty 1
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
## 2012-11-07 NOTE — Progress Notes (Signed)
Patient complaining of pain in bilateral knees and requesting pain medication.  Patient states the oxycodone makes him feel jittery and uncomfortable and inhibits his ability to sleep regardless of the dose.  Patient requesting a different pain medication.  Marissa Nestle, PA notified.  New order received for Morphine MSIR.  Will continue to monitor.

## 2012-11-07 NOTE — Progress Notes (Signed)
Patient information reviewed and entered into eRehab system by Essie Lagunes, RN, CRRN, PPS Coordinator.  Information including medical coding and functional independence measure will be reviewed and updated through discharge.    
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
## 2012-11-07 NOTE — Progress Notes (Addendum)
ANTICOAGULATION CONSULT NOTE - Follow Up Consult  Pharmacy Consult for coumadin Indication: VTE prophylaxis  Allergies  Allergen Reactions  . Ultram (Tramadol)     Moderate disorientation    Patient Measurements: Height: 6' (182.9 cm) Weight: 255 lb 9.6 oz (115.939 kg) IBW/kg (Calculated) : 77.6 Heparin Dosing Weight:   Vital Signs: Temp: 97.9 F (36.6 C) (05/13 0625) Temp src: Oral (05/13 0625) BP: 113/68 mmHg (05/13 0742) Pulse Rate: 125 (05/13 0631)  Labs:  Recent Labs  11/05/12 0450 11/06/12 0415 11/06/12 1510 11/07/12 0500  HGB 9.6*  --  9.2*  --   HCT 27.1*  --  25.7*  --   PLT 209  --  265  --   LABPROT 15.8* 16.7*  --  17.7*  INR 1.29 1.39  --  1.50*  CREATININE  --   --  1.24  --     Estimated Creatinine Clearance: 87.4 ml/min (by C-G formula based on Cr of 1.24).   Medications:  Scheduled:  . allopurinol  100 mg Oral QAC breakfast  . amLODipine  10 mg Oral QHS  . atorvastatin  20 mg Oral QHS  . enoxaparin (LOVENOX) injection  30 mg Subcutaneous Q12H  . [START ON 11/09/2012] hydrochlorothiazide  25 mg Oral QAC breakfast  . insulin aspart  0-20 Units Subcutaneous TID WC  . insulin aspart  0-5 Units Subcutaneous QHS  . iron polysaccharides  150 mg Oral BID AC  . linagliptin  5 mg Oral QAC breakfast   And  . metFORMIN  500 mg Oral BID WC  . lisinopril  40 mg Oral QHS  . methocarbamol  500-1,000 mg Oral QID  . polyethylene glycol  17 g Oral BID  . potassium citrate  10 mEq Oral BID  . [COMPLETED] sorbitol  45 mL Oral Once  . tamsulosin  0.4 mg Oral QHS  . [COMPLETED] warfarin  10 mg Oral ONCE-1800  . Warfarin - Pharmacist Dosing Inpatient   Does not apply q1800  . [DISCONTINUED] enoxaparin (LOVENOX) injection  30 mg Subcutaneous Q12H  . [DISCONTINUED] hydrochlorothiazide  25 mg Oral QAC breakfast  . [DISCONTINUED] insulin aspart  0-15 Units Subcutaneous TID WC  . [DISCONTINUED] polyethylene glycol  17 g Oral Daily   Infusions:  . sodium  chloride    . [DISCONTINUED] sodium chloride      Assessment: 57 yo male s/p TKA is currently on subtherapeutic coumadin.  INR is up to 1.5.  Also on lovenox 30mg  sq q12h.  Goal of Therapy:  INR 2-3 Monitor platelets by anticoagulation protocol: Yes   Plan:  1) Coumadin 10 mg po x1 2) Lovenox 30 mg SQ q 12 h until INR 2.0 or above, then discontinue. 3) Watch SCr (on metformin) 4) Educated on coumadin   Kaeleb Emond, Tsz-Yin 11/07/2012,8:35 AM

## 2012-11-07 NOTE — Progress Notes (Signed)
Patient ID: Leonard [name], male   DOB: 1956-05-24, 57 y.o.   MRN: [id number] 57 y.o. male with history of HTN, DM, bilateral knee pain due to OA with significant varus deformity and failure of conservative therapy. Patient elected to undergo B-TKR on 11/01/12 by Dr. Lequita Halt. Post op with hypotension requiring IVF and complaints of numbness RLE. Epidural cath removed 05/09. He was noted to have presyncopal episode with therapy as well as problems weight bearing through RLE due to numbness. Therapies ongoing and recommended CIR Subjective/Complaints:  No C/Os Review of Systems - Negative except Joint pain Objective: Vital Signs: Blood pressure 136/79, pulse 125, temperature 97.9 F (36.6 C), temperature source Oral, resp. rate 19, height 6' (1.829 m), weight 115.939 kg (255 lb 9.6 oz), SpO2 92.00%. No results found. Results for orders placed during the hospital encounter of 11/06/12 (from the past 72 hour(s))  CBC WITH DIFFERENTIAL     Status: Abnormal   Collection Time    11/06/12  3:10 PM      Result Value Range   WBC 12.0 (*) 4.0 - 10.5 K/uL   RBC 3.10 (*) 4.22 - 5.81 MIL/uL   Hemoglobin 9.2 (*) 13.0 - 17.0 g/dL   HCT 40.9 (*) 81.1 - 91.4 %   MCV 82.9  78.0 - 100.0 fL   MCH 29.7  26.0 - 34.0 pg   MCHC 35.8  30.0 - 36.0 g/dL   RDW 78.2  95.6 - 21.3 %   Platelets 265  150 - 400 K/uL   Neutrophils Relative 69  43 - 77 %   Neutro Abs 8.3 (*) 1.7 - 7.7 K/uL   Lymphocytes Relative 14  12 - 46 %   Lymphs Abs 1.7  0.7 - 4.0 K/uL   Monocytes Relative 15 (*) 3 - 12 %   Monocytes Absolute 1.8 (*) 0.1 - 1.0 K/uL   Eosinophils Relative 2  0 - 5 %   Eosinophils Absolute 0.2  0.0 - 0.7 K/uL   Basophils Relative 0  0 - 1 %   Basophils Absolute 0.0  0.0 - 0.1 K/uL  COMPREHENSIVE METABOLIC PANEL     Status: Abnormal   Collection Time    11/06/12  3:10 PM      Result Value Range   Sodium 130 (*) 135 - 145 mEq/L   Potassium 4.5  3.5 - 5.1 mEq/L   Chloride 94 (*) 96 - 112 mEq/L   CO2 26   19 - 32 mEq/L   Glucose, Bld 140 (*) 70 - 99 mg/dL   BUN 30 (*) 6 - 23 mg/dL   Creatinine, Ser 0.86  0.50 - 1.35 mg/dL   Calcium 9.0  8.4 - 57.8 mg/dL   Total Protein 7.0  6.0 - 8.3 g/dL   Albumin 3.3 (*) 3.5 - 5.2 g/dL   AST 13  0 - 37 U/L   ALT 20  0 - 53 U/L   Alkaline Phosphatase 52  39 - 117 U/L   Total Bilirubin 0.6  0.3 - 1.2 mg/dL   GFR calc non Af Amer 63 (*) >90 mL/min   GFR calc Af Amer 73 (*) >90 mL/min   Comment:            The eGFR has been calculated     using the CKD EPI equation.     This calculation has not been     validated in all clinical     situations.     eGFR's persistently     <  90 mL/min signify     possible Chronic Kidney Disease.  URINALYSIS, ROUTINE W REFLEX MICROSCOPIC     Status: None   Collection Time    11/06/12  3:50 PM      Result Value Range   Color, Urine YELLOW  YELLOW   APPearance CLEAR  CLEAR   Specific Gravity, Urine 1.011  1.005 - 1.030   pH 5.5  5.0 - 8.0   Glucose, UA NEGATIVE  NEGATIVE mg/dL   Hgb urine dipstick NEGATIVE  NEGATIVE   Bilirubin Urine NEGATIVE  NEGATIVE   Ketones, ur NEGATIVE  NEGATIVE mg/dL   Protein, ur NEGATIVE  NEGATIVE mg/dL   Urobilinogen, UA 0.2  0.0 - 1.0 mg/dL   Nitrite NEGATIVE  NEGATIVE   Leukocytes, UA NEGATIVE  NEGATIVE   Comment: MICROSCOPIC NOT DONE ON URINES WITH NEGATIVE PROTEIN, BLOOD, LEUKOCYTES, NITRITE, OR GLUCOSE <1000 mg/dL.  GLUCOSE, CAPILLARY     Status: Abnormal   Collection Time    11/06/12  4:35 PM      Result Value Range   Glucose-Capillary 169 (*) 70 - 99 mg/dL   Comment 1 Notify RN    GLUCOSE, CAPILLARY     Status: Abnormal   Collection Time    11/06/12  8:33 PM      Result Value Range   Glucose-Capillary 193 (*) 70 - 99 mg/dL   Comment 1 Notify RN    PROTIME-INR     Status: Abnormal   Collection Time    11/07/12  5:00 AM      Result Value Range   Prothrombin Time 17.7 (*) 11.6 - 15.2 seconds   INR 1.50 (*) 0.00 - 1.49     HEENT: normal Lungs clear Heart regular rate  and rhythm Abdomen positive bowel sounds soft nontender palpation Extremities mild to moderate knee effusion, mild erythema around incision site at knees. Decreased range of motion at the knees. Skin: Subcuticular sutures. No bleeding or drainage Motor strength is 5/5 in bilateral deltoid, biceps, triceps, grip Bilateral hip flexors 3 minus knee extensors 3 minus ankle dose flexors plantar flexor 5/5   Assessment/Plan: 1. Functional deficits secondary to B TKR which require 3+ hours per day of interdisciplinary therapy in a comprehensive inpatient rehab setting. Physiatrist is providing close team supervision and 24 hour management of active medical problems listed below. Physiatrist and rehab team continue to assess barriers to discharge/monitor patient progress toward functional and medical goals. FIM:                   Comprehension Comprehension Mode: Auditory Comprehension: 6-Follows complex conversation/direction: With extra time/assistive device  Expression Expression Mode: Verbal Expression: 5-Expresses complex 90% of the time/cues < 10% of the time  Social Interaction Social Interaction: 6-Interacts appropriately with others with medication or extra time (anti-anxiety, antidepressant).  Problem Solving Problem Solving: 5-Solves basic problems: With no assist  Memory Memory: 5-Recognizes or recalls 90% of the time/requires cueing < 10% of the time  Medical Problem List and Plan:  1. DVT Prophylaxis/Anticoagulation: Pharmaceutical: Coumadin and Lovenox  2. Pain Management: Continue oxycodone as needed. Complaining of muscle aches. Will schedule robaxin.  3. Mood: Seems to be motivated. No signs of distress. Will monitor and have LCSW follow up for evaluation.  4. Neuropsych: This patient is capable of making decisions on his own behalf.  5. DM type 2: Will monitor with AC/HS cbg checks. Continue tradenta and metformin. Use SSI for elevated BS. Anticipate BS to  improve with increase  in activity.  6. HTN: Monitor BS on bid basis. Will check orthostatic BP/Pulse. Set parameters for Norvasc, HCTZ, and Lotensin.  -anemia component to symptoms, encourage fluids as well.  7. ABLA: add iron supplement. Recheck CBC 05/12.  8. Leucocytosis: Likely reactive. Will check UA/UCS.  9. Constipation: Will schedule miralax. Prn suppository 10.  Hyponatremia-IVF at noc monitor BMET, stop HCTZ LOS (Days) 1 A FACE TO FACE EVALUATION WAS PERFORMED  Raniya Golembeski E 11/07/2012, 7:35 AM

## 2012-11-07 NOTE — Progress Notes (Signed)
Physical Therapy Session Note  Patient Details  Name: Leonard [name] MRN: [id number] Date of Birth: 10/06/1955  Today's Date: 11/07/2012 Time: 1430-1508 Time Calculation (min): 38 min  Short Term Goals: Week 1:  PT Short Term Goal 1 (Week 1): STGs=LTGs due to ELOS  Skilled Therapeutic Interventions/Progress Updates:    Patient received sitting in recliner. Patient transfers recliner<>wheelchair with rolling walker and min assist. Patient propelled wheelchair x150' with B UE and supervision. Patient transfers wheelchair<>mat with RW and min assist. Seated edge of mat, patient performs heel slides x15 reps with 5" hold in flexion with pillow case under each foot to assist with increased B knee flexion. Patient able to achieve PROM L knee: 64 degrees, PROM R knee flexion: 69 degrees.  Patient returned to room and transferred back to recliner. Left with all needs within reach.  Therapy Documentation Precautions:  Precautions Precautions: Knee;Fall Required Braces or Orthoses: Knee Immobilizer - Right;Knee Immobilizer - Left Knee Immobilizer - Right: Discontinue once straight leg raise with < 10 degree lag Knee Immobilizer - Left: Discontinue once straight leg raise with < 10 degree lag Restrictions Weight Bearing Restrictions: No RLE Weight Bearing: Weight bearing as tolerated LLE Weight Bearing: Weight bearing as tolerated Pain: Pain Assessment Pain Assessment: 0-10 Pain Score:   3 Pain Type: Surgical pain Pain Location: Knee Pain Orientation: Right;Left Pain Descriptors: Aching Pain Onset: With Activity Pain Intervention(s): RN made aware Multiple Pain Sites: No Locomotion : Corporate treasurer: Yes Wheelchair Assistance: 5: Financial planner Details: Verbal cues for Engineer, drilling: Both upper extremities Wheelchair Parts Management: Needs assistance Distance: 150   See FIM for current functional  status  Therapy/Group: Individual Therapy  Leonard [name]. Leonard [name], PT, DPT  11/07/2012, 4:38 PM

## 2012-11-08 ENCOUNTER — Inpatient Hospital Stay (HOSPITAL_COMMUNITY): Payer: BC Managed Care – PPO

## 2012-11-08 ENCOUNTER — Inpatient Hospital Stay (HOSPITAL_COMMUNITY): Payer: BC Managed Care – PPO | Admitting: *Deleted

## 2012-11-08 LAB — PROTIME-INR
INR: 1.55 — ABNORMAL HIGH (ref 0.00–1.49)
Prothrombin Time: 18.1 seconds — ABNORMAL HIGH (ref 11.6–15.2)

## 2012-11-08 LAB — GLUCOSE, CAPILLARY
Glucose-Capillary: 155 mg/dL — ABNORMAL HIGH (ref 70–99)
Glucose-Capillary: 187 mg/dL — ABNORMAL HIGH (ref 70–99)

## 2012-11-08 MED ORDER — OXYCODONE HCL ER 10 MG PO T12A
10.0000 mg | EXTENDED_RELEASE_TABLET | Freq: Two times a day (BID) | ORAL | Status: DC
  Administered 2012-11-08 – 2012-11-13 (×11): 10 mg via ORAL
  Filled 2012-11-08 (×12): qty 1

## 2012-11-08 MED ORDER — WARFARIN SODIUM 2.5 MG PO TABS
12.5000 mg | ORAL_TABLET | Freq: Once | ORAL | Status: AC
  Administered 2012-11-08: 12.5 mg via ORAL
  Filled 2012-11-08: qty 1

## 2012-11-08 MED ORDER — SORBITOL 70 % SOLN
45.0000 mL | Freq: Once | Status: AC
  Administered 2012-11-08: 45 mL via ORAL
  Filled 2012-11-08: qty 60

## 2012-11-08 MED ORDER — GLIPIZIDE 5 MG PO TABS
5.0000 mg | ORAL_TABLET | Freq: Every day | ORAL | Status: DC
  Administered 2012-11-09: 5 mg via ORAL
  Filled 2012-11-08 (×2): qty 1

## 2012-11-08 MED ORDER — MORPHINE SULFATE 15 MG PO TABS: 15.0000 mg | ORAL_TABLET | Freq: Two times a day (BID) | ORAL | Status: DC

## 2012-11-08 NOTE — Progress Notes (Signed)
ANTICOAGULATION CONSULT NOTE - Follow Up Consult  Pharmacy Consult for coumadin Indication: VTE prophylaxis  Allergies  Allergen Reactions  . Ultram (Tramadol)     Moderate disorientation    Patient Measurements: Height: 6' (182.9 cm) Weight: 255 lb 9.6 oz (115.939 kg) IBW/kg (Calculated) : 77.6 Heparin Dosing Weight:   Vital Signs: Temp: 98 F (36.7 C) (05/14 0600) Temp src: Oral (05/14 0600) BP: 113/71 mmHg (05/14 0700) Pulse Rate: 102 (05/14 0700)  Labs:  Recent Labs  11/06/12 0415 11/06/12 1510 11/07/12 0500 11/08/12 0703  HGB  --  9.2*  --   --   HCT  --  25.7*  --   --   PLT  --  265  --   --   LABPROT 16.7*  --  17.7* 18.1*  INR 1.39  --  1.50* 1.55*  CREATININE  --  1.24  --   --     Estimated Creatinine Clearance: 87.4 ml/min (by C-G formula based on Cr of 1.24).   Medications:  Scheduled:  . allopurinol  100 mg Oral QAC breakfast  . amLODipine  10 mg Oral QHS  . atorvastatin  20 mg Oral QHS  . enoxaparin (LOVENOX) injection  30 mg Subcutaneous Q12H  . feeding supplement  120 mL Oral QID  . [START ON 11/09/2012] hydrochlorothiazide  25 mg Oral QAC breakfast  . insulin aspart  0-20 Units Subcutaneous TID WC  . insulin aspart  0-5 Units Subcutaneous QHS  . iron polysaccharides  150 mg Oral BID AC  . linagliptin  5 mg Oral QAC breakfast   And  . metFORMIN  500 mg Oral BID WC  . lisinopril  40 mg Oral QHS  . methocarbamol  500-1,000 mg Oral QID  . polyethylene glycol  17 g Oral BID  . potassium citrate  10 mEq Oral BID  . sorbitol  45 mL Oral Once  . tamsulosin  0.4 mg Oral QHS  . Warfarin - Pharmacist Dosing Inpatient   Does not apply q1800   Infusions:  . sodium chloride 75 mL/hr at 11/07/12 1840    Assessment: 57 yo male s/p TKA is currently on subtherapeutic coumadin.  INR today didn't change much and it's 1.55. Goal of Therapy:  INR 2-3 Monitor platelets by anticoagulation protocol: Yes   Plan:  1) Coumadin 12.5 mg po x1 2) Lovenox  30 mg SQ q 12 h until INR 2.0 or above, then discontinue.   Leonard [name], Tsz-Yin 11/08/2012,8:41 AM

## 2012-11-08 NOTE — Progress Notes (Signed)
Patient ID: Leonard [name], male   DOB: 1956-03-16, 57 y.o.   MRN: [id number] 57 y.o. male with history of HTN, DM, bilateral knee pain due to OA with significant varus deformity and failure of conservative therapy. Patient elected to undergo B-TKR on 11/01/12 by Dr. Lequita Halt. Post op with hypotension requiring IVF and complaints of numbness RLE. Epidural cath removed 05/09. He was noted to have presyncopal episode with therapy as well as problems weight bearing through RLE due to numbness.   Subjective/Complaints: Slept poorly room was too warm Review of Systems  Musculoskeletal: Positive for joint pain.  All other systems reviewed and are negative.   Objective: Vital Signs: Blood pressure 113/71, pulse 102, temperature 98 F (36.7 C), temperature source Oral, resp. rate 17, height 6' (1.829 m), weight 115.939 kg (255 lb 9.6 oz), SpO2 91.00%. No results found. Results for orders placed during the hospital encounter of 11/06/12 (from the past 72 hour(s))  CBC WITH DIFFERENTIAL     Status: Abnormal   Collection Time    11/06/12  3:10 PM      Result Value Range   WBC 12.0 (*) 4.0 - 10.5 K/uL   RBC 3.10 (*) 4.22 - 5.81 MIL/uL   Hemoglobin 9.2 (*) 13.0 - 17.0 g/dL   HCT 78.2 (*) 95.6 - 21.3 %   MCV 82.9  78.0 - 100.0 fL   MCH 29.7  26.0 - 34.0 pg   MCHC 35.8  30.0 - 36.0 g/dL   RDW 08.6  57.8 - 46.9 %   Platelets 265  150 - 400 K/uL   Neutrophils Relative % 69  43 - 77 %   Neutro Abs 8.3 (*) 1.7 - 7.7 K/uL   Lymphocytes Relative 14  12 - 46 %   Lymphs Abs 1.7  0.7 - 4.0 K/uL   Monocytes Relative 15 (*) 3 - 12 %   Monocytes Absolute 1.8 (*) 0.1 - 1.0 K/uL   Eosinophils Relative 2  0 - 5 %   Eosinophils Absolute 0.2  0.0 - 0.7 K/uL   Basophils Relative 0  0 - 1 %   Basophils Absolute 0.0  0.0 - 0.1 K/uL  COMPREHENSIVE METABOLIC PANEL     Status: Abnormal   Collection Time    11/06/12  3:10 PM      Result Value Range   Sodium 130 (*) 135 - 145 mEq/L   Potassium 4.5  3.5 - 5.1  mEq/L   Chloride 94 (*) 96 - 112 mEq/L   CO2 26  19 - 32 mEq/L   Glucose, Bld 140 (*) 70 - 99 mg/dL   BUN 30 (*) 6 - 23 mg/dL   Creatinine, Ser 6.29  0.50 - 1.35 mg/dL   Calcium 9.0  8.4 - 52.8 mg/dL   Total Protein 7.0  6.0 - 8.3 g/dL   Albumin 3.3 (*) 3.5 - 5.2 g/dL   AST 13  0 - 37 U/L   ALT 20  0 - 53 U/L   Alkaline Phosphatase 52  39 - 117 U/L   Total Bilirubin 0.6  0.3 - 1.2 mg/dL   GFR calc non Af Amer 63 (*) >90 mL/min   GFR calc Af Amer 73 (*) >90 mL/min   Comment:            The eGFR has been calculated     using the CKD EPI equation.     This calculation has not been     validated in all clinical  situations.     eGFR's persistently     <90 mL/min signify     possible Chronic Kidney Disease.  URINALYSIS, ROUTINE W REFLEX MICROSCOPIC     Status: None   Collection Time    11/06/12  3:50 PM      Result Value Range   Color, Urine YELLOW  YELLOW   APPearance CLEAR  CLEAR   Specific Gravity, Urine 1.011  1.005 - 1.030   pH 5.5  5.0 - 8.0   Glucose, UA NEGATIVE  NEGATIVE mg/dL   Hgb urine dipstick NEGATIVE  NEGATIVE   Bilirubin Urine NEGATIVE  NEGATIVE   Ketones, ur NEGATIVE  NEGATIVE mg/dL   Protein, ur NEGATIVE  NEGATIVE mg/dL   Urobilinogen, UA 0.2  0.0 - 1.0 mg/dL   Nitrite NEGATIVE  NEGATIVE   Leukocytes, UA NEGATIVE  NEGATIVE   Comment: MICROSCOPIC NOT DONE ON URINES WITH NEGATIVE PROTEIN, BLOOD, LEUKOCYTES, NITRITE, OR GLUCOSE <1000 mg/dL.  URINE CULTURE     Status: None   Collection Time    11/06/12  3:51 PM      Result Value Range   Specimen Description URINE, CLEAN CATCH     Special Requests NONE     Culture  Setup Time 11/06/2012 16:36     Colony Count NO GROWTH     Culture NO GROWTH     Report Status 11/07/2012 FINAL    GLUCOSE, CAPILLARY     Status: Abnormal   Collection Time    11/06/12  4:35 PM      Result Value Range   Glucose-Capillary 169 (*) 70 - 99 mg/dL   Comment 1 Notify RN    GLUCOSE, CAPILLARY     Status: Abnormal   Collection  Time    11/06/12  8:33 PM      Result Value Range   Glucose-Capillary 193 (*) 70 - 99 mg/dL   Comment 1 Notify RN    PROTIME-INR     Status: Abnormal   Collection Time    11/07/12  5:00 AM      Result Value Range   Prothrombin Time 17.7 (*) 11.6 - 15.2 seconds   INR 1.50 (*) 0.00 - 1.49  GLUCOSE, CAPILLARY     Status: Abnormal   Collection Time    11/07/12  7:19 AM      Result Value Range   Glucose-Capillary 235 (*) 70 - 99 mg/dL   Comment 1 Notify RN    GLUCOSE, CAPILLARY     Status: Abnormal   Collection Time    11/07/12  4:30 PM      Result Value Range   Glucose-Capillary 149 (*) 70 - 99 mg/dL  GLUCOSE, CAPILLARY     Status: Abnormal   Collection Time    11/07/12  8:49 PM      Result Value Range   Glucose-Capillary 155 (*) 70 - 99 mg/dL  PROTIME-INR     Status: Abnormal   Collection Time    11/08/12  7:03 AM      Result Value Range   Prothrombin Time 18.1 (*) 11.6 - 15.2 seconds   INR 1.55 (*) 0.00 - 1.49  GLUCOSE, CAPILLARY     Status: Abnormal   Collection Time    11/08/12  7:14 AM      Result Value Range   Glucose-Capillary 174 (*) 70 - 99 mg/dL    HEENT: normal Lungs clear Heart regular rate and rhythm Abdomen positive bowel sounds soft nontender palpation Extremities mild to  moderate knee effusion, mild erythema around incision site at knees. Decreased range of motion at the knees. Skin: Subcuticular sutures. No bleeding or drainage Motor strength is 5/5 in bilateral deltoid, biceps, triceps, grip Bilateral hip flexors 3 minus knee extensors 3 minus ankle dose flexors plantar flexor 5/5  Assessment/Plan: 1. Functional deficits secondary to bilateral TKR which require 3+ hours per day of interdisciplinary therapy in a comprehensive inpatient rehab setting. Physiatrist is providing close team supervision and 24 hour management of active medical problems listed below. Physiatrist and rehab team continue to assess barriers to discharge/monitor patient progress  toward functional and medical goals. FIM: FIM - Bathing Bathing: 0: Activity did not occur  FIM - Upper Body Dressing/Undressing Upper body dressing/undressing: 0: Activity did not occur FIM - Lower Body Dressing/Undressing Lower body dressing/undressing steps patient completed: Pull pants up/down Lower body dressing/undressing: 1: Total-Patient completed less than 25% of tasks  FIM - Toileting Toileting: 0: Activity did not occur  FIM - Diplomatic Services operational officer Devices: Elevated toilet seat;Grab bars;Walker Toilet Transfers: 4-To toilet/BSC: Min A (steadying Pt. > 75%);4-From toilet/BSC: Min A (steadying Pt. > 75%)  FIM - Banker Devices: Walker;Arm rests Bed/Chair Transfer: 3: Supine > Sit: Mod A (lifting assist/Pt. 50-74%/lift 2 legs;3: Sit > Supine: Mod A (lifting assist/Pt. 50-74%/lift 2 legs);4: Bed > Chair or W/C: Min A (steadying Pt. > 75%);4: Chair or W/C > Bed: Min A (steadying Pt. > 75%)  FIM - Locomotion: Wheelchair Distance: 150 Locomotion: Wheelchair: 5: Travels 150 ft or more: maneuvers on rugs and over door sills with supervision, cueing or coaxing FIM - Locomotion: Ambulation Locomotion: Ambulation Assistive Devices: Designer, industrial/product Ambulation/Gait Assistance: 4: Min assist Locomotion: Ambulation: 2: Travels 50 - 149 ft with minimal assistance (Pt.>75%)  Comprehension Comprehension Mode: Auditory Comprehension: 7-Follows complex conversation/direction: With no assist  Expression Expression Mode: Verbal Expression: 7-Expresses complex ideas: With no assist  Social Interaction Social Interaction: 7-Interacts appropriately with others - No medications needed.  Problem Solving Problem Solving: 7-Solves complex problems: Recognizes & self-corrects  Memory Memory: 7-Complete Independence: No helper  Medical Problem List and Plan:  1. DVT Prophylaxis/Anticoagulation: Pharmaceutical: Coumadin sub  therapeutic cont Lovenox  2. Pain Management: Continue oxycodone as needed. Complaining of muscle aches. Will schedule robaxin.  3. Mood: Seems to be motivated. No signs of distress. Will monitor and have LCSW follow up for evaluation.  4. Neuropsych: This patient is capable of making decisions on his own behalf.  5. DM type 2: Will monitor with AC/HS cbg checks. Continue tradenta and metformin. Use SSI for elevated BS. Anticipate BS to improve with increase in activity.  6. HTN: Monitor BS on bid basis. Will check orthostatic BP/Pulse. Set parameters for Norvasc, HCTZ, and Lotensin.  -anemia component to symptoms, encourage fluids as well.  7. ABLA: add iron supplement. Recheck CBC 05/12.  8. Leucocytosis: Likely reactive. Will check UA/UCS.  9. Constipation: Will schedule miralax. Prn suppository   LOS (Days) 2 A FACE TO FACE EVALUATION WAS PERFORMED  KIRSTEINS,ANDREW E 11/08/2012, 10:13 AM

## 2012-11-08 NOTE — Progress Notes (Signed)
Occupational Therapy Session Note  Patient Details  Name: Leonard [name] MRN: [id number] Date of Birth: 07-14-1955  Today's Date: 11/08/2012 Time: 0730-0820 and 1300-1330  Time Calculation (min): 50 min and 30 min   Short Term Goals: Week 1:  OT Short Term Goal 1 (Week 1): Pt will complete LB dressing and bathing tasks with min assist using AE/DME prn OT Short Term Goal 2 (Week 1): Pt will tolerate standing at sink for 3 min during grooming task.  OT Short Term Goal 3 (Week 1): Pt will tolerate standing in shower with min-steadying assist to wash 4/10 body parts  OT Short Term Goal 4 (Week 1): Pt will complete toilet transfer with supervision assist   Skilled Therapeutic Interventions/Progress Updates:    Session 1: Pt declined completing self-care tasks with OT. Pt reporting he is "too embarassed" and will not do it. OT respected pt's privacy and offered to have male OT to complete tasks with him. Pt declined and stated he will only let his wife assist. Educated on wife not being trained to completing self-care tasks with pt at this time however offered to have wife come in ASAP to participate in training. Pt declined stating the only day she is off is Wednesdays and "she's too busy." Provided extensive education on rehab process and goals from OT. Pt agreed on goals. Educated on gradually completing shower in standing as he reported he will be doing this at home. Pt initially agreed to goal then stated "I won't take showers at home I will just wash outside with my wife bringing buckets." Educated on practicing transfers and using AE as part of self-care tasks. Pt agreed then stated "just give me tools and I can figure out how to get ready." OT presented sock-aid and reacher to pt to practice with donning socks. Pt required several verbal cues to use AE correctly then able to don with increased time. Pt completed shower transfer with min assist however unable to "get comfortable" secondary to  decreased BLE ROM. Pt very hesitant during bed mobility and functional transfers secondary to pain rated 8/10. Pt required rest break to lie down reporting dizziness. BP taken and was 113/71 however PR at 102 and decreased as pain relieved and with deep breathing tech. Nursing staff interrupted therapy session to provided medications and pt requested pain meds. Pt then willing to complete transfer with RW min assist bed to recliner chair.   Session 2: Therapy session focused on dynamic standing balance, activity tolerance, and functional ambulation. Pt in recliner chair upon arrival and willing to participate in therapy at this time. Ambulated from room to therapy gym with RW and steadying assist and increased time. Required rest break before engaging in dynamic standing activity of reaching towards lower surface to facilitate knee flexion and simulate reaching for items from low drawers then placing at overhead target. Completed 2 trials while reaching to left side and required long rest break as pt reported some dizziness. Pt reported same feeling he always gets at home when reaching for item. Pt then engaged in activity on Rt side. Dizziness again and pt required rest break. Pt them ambulated back to room with RW and steadying assist. Pt left in recliner chair with all items needed within reach. No c/o dizziness when ambulating back to room and vitals were normal throughout session. Pt consistently asking what time it was during therapy session as he reported "I can't miss PT." OT informed pt of schedule and reassured him that  he would not miss PT then educated on benefits of receiving both OT and PT combined.   Therapy Documentation Precautions:  Precautions Precautions: Knee;Fall Precaution Comments: Instructed pt on KI use for amb until able to perform 10 active SLR Ind Required Braces or Orthoses: Knee Immobilizer - Right;Knee Immobilizer - Left Knee Immobilizer - Right: Discontinue once straight  leg raise with < 10 degree lag Knee Immobilizer - Left: Discontinue once straight leg raise with < 10 degree lag Restrictions Weight Bearing Restrictions: No RLE Weight Bearing: Weight bearing as tolerated LLE Weight Bearing: Weight bearing as tolerated General:   Vital Signs: Therapy Vitals Pulse Rate: 102 BP: 113/71 mmHg Patient Position, if appropriate: Lying Pain: Pain Assessment Pain Assessment: 0-10 Pain Score:   8 Pain Type: Surgical pain Pain Location: Knee Pain Orientation: Right;Left Pain Descriptors: Aching Pain Frequency: Intermittent Pain Onset: With Activity Patients Stated Pain Goal: 3 Pain Intervention(s): RN made aware;Repositioned;Ambulation/increased activity Multiple Pain Sites: No ADL:   Exercises:   Other Treatments:    See FIM for current functional status  Therapy/Group: Individual Therapy  Daneil Dan 11/08/2012, 10:56 AM

## 2012-11-08 NOTE — Progress Notes (Signed)
Physical Therapy Session Note  Patient Details  Name: Leonard [name] MRN: [id number] Date of Birth: 03-01-1956  Today's Date: 11/08/2012 Time: 7829-5621 and 3086-5784 Time Calculation (min): 59 min and 45 min  Short Term Goals: Week 1:  PT Short Term Goal 1 (Week 1): STGs=LTGs due to ELOS  Skilled Therapeutic Interventions/Progress Updates:    AM Session: Patient received sitting in recliner. This session focused on gait training and ROM B knees. See details below for gait training. Patient demonstrates improved safety, sequencing, and technique with all sit<>stand transfers during session.  Patient instructed in strengthening and ROM exercises supine on mat: Prolonged extension stretching with towel under ankle B knees, B quad sets with 5" hold x20, B heel slides to improve knee flexion with use of sheet to facilitate self-stretching (5" hold in flexion).  Sitting edge of mat: heels slides with pillow case under foot to facilitate improved knee flexion. 15 reps with 5" hold in flexion, then x5 passive knee flexion stretching facilitated by PT with 10" hold each knee. Provided exercise handout for TKA exercises and instructed. Patient returned to room and left seated in recliner.  PM Session: Patient received sitting in recliner. This session focused on gait training and ROM B knees. See details below for gait training. Patient instructed in strengthening and ROM exercises supine on mat: Prolonged extension stretching with towel under ankle B knees, B quad sets with 5" hold x20. Patient with decreased ROM in extension this PM as compared to this AM. Additionally, patient able to tolerate less ROM activities.  Sitting edge of mat: heels slides with pillow case under foot to facilitate improved knee flexion. 15 reps with 5" hold in flexion, then x3 passive knee flexion stretching facilitated by PT with 10" hold each knee. Patient unable to tolerate PROM as well this afternoon. Patient with  c/o 10/10 pain and requesting to stop certain activities. Patient attempted to ambulate back to room from gym, but only able to go about 20' prior to needing to sit in wheelchair and be pushed back to room. Patient returned to room and left seated in wheelchair with nurse tech present.   Therapy Documentation Precautions:  Precautions Precautions: Knee;Fall Precaution Comments: Instructed pt on KI use for amb until able to perform 10 active SLR Ind Required Braces or Orthoses: Knee Immobilizer - Right;Knee Immobilizer - Left Knee Immobilizer - Right: Discontinue once straight leg raise with < 10 degree lag Knee Immobilizer - Left: Discontinue once straight leg raise with < 10 degree lag Restrictions Weight Bearing Restrictions: No RLE Weight Bearing: Weight bearing as tolerated LLE Weight Bearing: Weight bearing as tolerated Pain: Pain Assessment Pain Assessment: 0-10 Pain Score:   8 Pain Type: Surgical pain Pain Location: Knee Pain Orientation: Right;Left Pain Descriptors: Aching Pain Onset: With Activity Pain Intervention(s): RN made aware;Repositioned;Ambulation/increased activity Multiple Pain Sites: No Locomotion : Ambulation Ambulation: Yes Ambulation/Gait Assistance: 4: Min assist Ambulation Distance (Feet): 150 Feetx2 (in AM) 150' x1 and 20' x1 (in PM). Assistive device: Rolling walker Ambulation/Gait Assistance Details: Verbal cues for precautions/safety;Verbal cues for gait pattern Ambulation/Gait Assistance Details: Verbal cues to decrease lateral trunk sway, maintain BOS within RW, decrease weight bearing through B UEs, progress to step-through pattern. Gait Gait: Yes Gait Pattern: Impaired Gait Pattern: Step-to pattern;Trunk flexed;Decreased stride length;Decreased step length - left;Decreased step length - right;Decreased hip/knee flexion - left;Decreased hip/knee flexion - right;Step-through pattern;Wide base of support;Lateral trunk lean to right;Lateral trunk  lean to left   See FIM for  current functional status  Therapy/Group: Individual Therapy  Chipper Herb. Nakayla Rorabaugh, PT, DPT  11/08/2012, 10:43 AM

## 2012-11-09 ENCOUNTER — Inpatient Hospital Stay (HOSPITAL_COMMUNITY): Payer: BC Managed Care – PPO | Admitting: *Deleted

## 2012-11-09 ENCOUNTER — Inpatient Hospital Stay (HOSPITAL_COMMUNITY): Payer: BC Managed Care – PPO

## 2012-11-09 DIAGNOSIS — D62 Acute posthemorrhagic anemia: Secondary | ICD-10-CM

## 2012-11-09 DIAGNOSIS — Z96659 Presence of unspecified artificial knee joint: Secondary | ICD-10-CM

## 2012-11-09 DIAGNOSIS — IMO0002 Reserved for concepts with insufficient information to code with codable children: Secondary | ICD-10-CM

## 2012-11-09 LAB — BASIC METABOLIC PANEL
BUN: 22 mg/dL (ref 6–23)
Chloride: 99 mEq/L (ref 96–112)
Creatinine, Ser: 1.02 mg/dL (ref 0.50–1.35)
GFR calc Af Amer: >90 mL/min (ref >90)
GFR calc non Af Amer: 80 mL/min — ABNORMAL LOW (ref >90)
Glucose, Bld: 146 mg/dL — ABNORMAL HIGH (ref 70–99)
Potassium: 4.2 mEq/L (ref 3.5–5.1)

## 2012-11-09 LAB — GLUCOSE, CAPILLARY: Glucose-Capillary: 196 mg/dL — ABNORMAL HIGH (ref 70–99)

## 2012-11-09 LAB — CBC
HCT: 25.6 % — ABNORMAL LOW (ref 39.0–52.0)
Hemoglobin: 8.8 g/dL — ABNORMAL LOW (ref 13.0–17.0)
MCHC: 34.4 g/dL (ref 30.0–36.0)
MCV: 84.5 fL (ref 78.0–100.0)
RDW: 13 % (ref 11.5–15.5)

## 2012-11-09 LAB — PROTIME-INR: Prothrombin Time: 19.5 seconds — ABNORMAL HIGH (ref 11.6–15.2)

## 2012-11-09 MED ORDER — GLIPIZIDE 10 MG PO TABS
10.0000 mg | ORAL_TABLET | Freq: Every day | ORAL | Status: DC
  Administered 2012-11-10 – 2012-11-13 (×4): 10 mg via ORAL
  Filled 2012-11-09 (×5): qty 1

## 2012-11-09 MED ORDER — WARFARIN SODIUM 2.5 MG PO TABS
12.5000 mg | ORAL_TABLET | Freq: Once | ORAL | Status: AC
  Administered 2012-11-09: 12.5 mg via ORAL
  Filled 2012-11-09 (×2): qty 1

## 2012-11-09 NOTE — Progress Notes (Signed)
ANTICOAGULATION CONSULT NOTE - Follow Up Consult  Pharmacy Consult for coumadin Indication: VTE prophylaxis  Allergies  Allergen Reactions  . Ultram (Tramadol)     Moderate disorientation    Patient Measurements: Height: 6' (182.9 cm) Weight: 252 lb 3.2 oz (114.397 kg) IBW/kg (Calculated) : 77.6 Heparin Dosing Weight:   Vital Signs: Temp: 97.5 F (36.4 C) (05/15 0518) Temp src: Oral (05/15 0518) BP: 143/92 mmHg (05/15 0518) Pulse Rate: 89 (05/15 0518)  Labs:  Recent Labs  11/06/12 1510 11/07/12 0500 11/08/12 0703 11/09/12 0544  HGB 9.2*  --   --   --   HCT 25.7*  --   --   --   PLT 265  --   --   --   LABPROT  --  17.7* 18.1* 19.5*  INR  --  1.50* 1.55* 1.71*  CREATININE 1.24  --   --   --     Estimated Creatinine Clearance: 86.8 ml/min (by C-G formula based on Cr of 1.24).   Medications:  Scheduled:  . allopurinol  100 mg Oral QAC breakfast  . amLODipine  10 mg Oral QHS  . atorvastatin  20 mg Oral QHS  . enoxaparin (LOVENOX) injection  30 mg Subcutaneous Q12H  . feeding supplement  120 mL Oral QID  . glipiZIDE  5 mg Oral QAC breakfast  . hydrochlorothiazide  25 mg Oral QAC breakfast  . insulin aspart  0-20 Units Subcutaneous TID WC  . insulin aspart  0-5 Units Subcutaneous QHS  . iron polysaccharides  150 mg Oral BID AC  . linagliptin  5 mg Oral QAC breakfast   And  . metFORMIN  500 mg Oral BID WC  . lisinopril  40 mg Oral QHS  . methocarbamol  500-1,000 mg Oral QID  . OxyCODONE  10 mg Oral Q12H  . polyethylene glycol  17 g Oral BID  . potassium citrate  10 mEq Oral BID  . tamsulosin  0.4 mg Oral QHS  . Warfarin - Pharmacist Dosing Inpatient   Does not apply q1800   Infusions:  . sodium chloride Stopped (11/09/12 1610)    Assessment: 57 yo male s/p TKA is currently on subtherapeutic coumadin.  INR up to 1.71 from 1.55 after coumadin 12.5mg  dose last night.  Also on lovenox 30mg  sq q12h. Goal of Therapy:  INR 2-3 Monitor platelets by  anticoagulation protocol: Yes   Plan:  1) Coumadin 12.5 mg po x1 2) Lovenox 30 mg SQ q 12 h until INR 2.0 or above, then discontinue.   Marian Grandt, Tsz-Yin 11/09/2012,8:36 AM

## 2012-11-09 NOTE — Progress Notes (Signed)
Occupational Therapy Session Note  Patient Details  Name: Leonard [name] MRN: [id number] Date of Birth: May 01, 1956  Today's Date: 11/09/2012 Time: 1000-1100 and 1300-1330  Time Calculation (min): 60 min and 30 min   Short Term Goals: Week 1:  OT Short Term Goal 1 (Week 1): Pt will complete LB dressing and bathing tasks with min assist using AE/DME prn OT Short Term Goal 2 (Week 1): Pt will tolerate standing at sink for 3 min during grooming task.  OT Short Term Goal 3 (Week 1): Pt will tolerate standing in shower with min-steadying assist to wash 4/10 body parts  OT Short Term Goal 4 (Week 1): Pt will complete toilet transfer with supervision assist   Skilled Therapeutic Interventions/Progress Updates:    Session 1: Therapy session focused on ADL retraining, functional transfers, standing tolerance, and activity tolerance. Explained focus of OT to patient and patient reported understanding and may be willing to participate in shower closer to discharge. Practiced toilet transfers and pt completed toileting task with supervision. Pt ambulated around room with RW and supervision for 32 min without rest breaks to organize and complete grooming tasks. Pt practiced reaching into low drawer to remove clothing. Educated on placing clothing on RW to transfer to bathroom when taking shower. Demonstrated good carryover as he picked up towels from floor with encouragement for knee flexion then taking them to laundry basket. Teach back method used to have pt complete exercises for knee flexion while supine in bed. Pt complete calf stretches and knee flexion exercises using sheet. Discussed home environment with pt and educated on Freight forwarder and organization. Pt very open to suggestions. Pt reported him and wife got into disagreement last night and he asked her to not come back in before discharge. Educated pt that family education is an important aspect of therapy. Pt responded with "she'll come around."  Pt consistently relies on BUE for support in standing and encouraged to decrease support of BUEs.   Session 2: Therapy session focused on activity tolerance, dynamic standing balance, and ROM to BLE to facilitate functional transfers. Pt ambulated with close supervision from room to ADL apartment with RW. Completed task of making bed with verbal cues to "bend at knees" as pt relied on flexing forward at waist. Pt reported he could "feel the difference" when flexing at knees. Ambulated to therapy gym then took first rest break before completing towel glide exercises sitting edge of mat. Placed wash cloth under each foot and pt practiced flexing knee to slide towel back. Completed first 5 himself then OT provided assist to flex knee further for 5 reps.  Therapy Documentation Precautions:  Precautions Precautions: Knee;Fall Precaution Comments: Instructed pt on KI use for amb until able to perform 10 active SLR Ind Required Braces or Orthoses: Knee Immobilizer - Left Knee Immobilizer - Right:  (Discontinued R KI 11/09/12; pt able to perform SLR on R) Knee Immobilizer - Left: Discontinue once straight leg raise with < 10 degree lag Restrictions Weight Bearing Restrictions: No RLE Weight Bearing: Weight bearing as tolerated LLE Weight Bearing: Weight bearing as tolerated General:   Vital Signs:   Pain: Pt reported 8-9/10 pain in BLE at knees.   See FIM for current functional status  Therapy/Group: Individual Therapy  Daneil Dan 11/09/2012, 12:28 PM
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
## 2012-11-09 NOTE — Patient Care Conference (Signed)
Inpatient RehabilitationTeam Conference and Plan of Care Update Date: 11/08/2012   Time: 11:00 AM    Patient Name: Leonard [name]      Medical Record Number: [id number]  Date of Birth: Aug 17, 1955 Sex: Male         Room/Bed: 4145/4145-01 Payor Info: Payor: BLUE Merchant navy officer / Plan: BCBS STATE HE[employer] PPO / Product Type: *No Product type* /    Admitting Diagnosis: B TKR TEAM CVA  Admit Date/Time:  11/06/2012  2:48 PM Admission Comments: No comment available   Primary Diagnosis:  Arthritis of both knees Principal Problem: Arthritis of both knees  Patient Active Problem List   Diagnosis Date Noted  . Acute blood loss anemia 11/06/2012  . Arthritis of both knees--B TKR 11/06/2012  . Orthostasis 11/06/2012  . Postop Hyponatremia 11/02/2012  . OA (osteoarthritis) of knee 11/01/2012  . Pre-op examination 10/04/2012  . Annual physical exam 04/06/2012  . LFT elevation 02/11/2011  . ABNORMAL STRESS ELECTROCARDIOGRAM 03/26/2010  . OBESITY 01/29/2008  . Anxiety and depression 05/22/2007  . OBSTRUCTIVE SLEEP APNEA 04/21/2007  . NEPHROLITHIASIS, HX OF 01/25/2007  . DIABETES MELLITUS, TYPE II 11/29/2006  . HYPERLIPIDEMIA 11/29/2006  . HYPERTENSION 11/29/2006  . OSTEOARTHRITIS 11/29/2006    Expected Discharge Date: Expected Discharge Date: 11/16/12  Team Members Present: Physician leading conference: Dr. Claudette Laws Social Worker Present: Amada Jupiter, LCSW Nurse Present: Gregor Hams, RN PT Present: Edman Circle, PT;Caroline Leonard, PT (Clarisse Gouge Ripa, PT) OT Present: Bretta Bang, Verlene Mayer, OT [facility] Perkinson, OT) Other (Discipline and Name): Tora Duck, PPS Coordinator     Current Status/Progress Goal Weekly Team Focus  Medical   Difficulty sleeping, bowel problems, pain control is challenging has cognitive issues with oxycodone but does not get adequate relief from Tylenol during the day after therapy  Maintain adequate pain control with minimal side effects   Medication management   Bowel/Bladder   pt cont of B/B.   Cont of B/B.  Will remain cont of B/B.   Swallow/Nutrition/ Hydration             ADL's   setup for UB, min assist for standing balance during self-care tasks, min assist for functional transfers, good carryover of AE  Mod I   standing balance/tolerance, activity tolerance, functional transfers, carryover with AE   Mobility   min A transfers and gait x75', stairs not yet attempted secondary to decreased ROM B knees, mod A bed mobility  mod I all mobility  gait, transfers, strengthening, increasing ROM B knees, activity tolerance, bed mobility, stairs   Communication             Safety/Cognition/ Behavioral Observations            Pain   c/o bilateral knee pain 4/10. Tylenol 650mg  po given prn q 4hrs. Pt also has oxycodone 10mg  po but refuses in evening states "It gives me nightmares".  less than or equal to 3.  assess and medicate as needed q shift and before therapies.    Skin   bilateral knee incisions attached with steri strips CDI.  will remain free of skin infection/breakdown.  no new skin breakdown/infection.     Rehab Goals Patient on target to meet rehab goals: Yes *See Care Plan and progress notes for long and short-term goals.  Barriers to Discharge: See above still requires physical assistance    Possible Resolutions to Barriers:  Continue therapy program, progressing toward goals    Discharge Planning/Teaching Needs:  home with wife  who can provide evening assist and possibly intermittent assist during the day      Team Discussion:  New eval.  Pt not interested in B/D sessions with OT.  ROM very poor and not willing to stay in CPM as long as needed.  Will try longer acting pain meds  Revisions to Treatment Plan:  None   Continued Need for Acute Rehabilitation Level of Care: The patient requires daily medical management by a physician with specialized training in physical medicine and rehabilitation for  the following conditions: Daily direction of a multidisciplinary physical rehabilitation program to ensure safe treatment while eliciting the highest outcome that is of practical value to the patient.: Yes Daily medical management of patient stability for increased activity during participation in an intensive rehabilitation regime.: Yes Daily analysis of laboratory values and/or radiology reports with any subsequent need for medication adjustment of medical intervention for : Post surgical problems  Leonard [name] 11/09/2012, 8:24 AM

## 2012-11-09 NOTE — Progress Notes (Signed)
Patient ID: Leonard [name], male   DOB: Mar 31, 1956, 57 y.o.   MRN: [id number] 57 y.o. male with history of HTN, DM, bilateral knee pain due to OA with significant varus deformity and failure of conservative therapy. Patient elected to undergo B-TKR on 11/01/12 by Dr. Lequita Halt. Post op with hypotension requiring IVF and complaints of numbness RLE. Epidural cath removed 05/09. He was noted to have presyncopal episode with therapy as well as problems weight bearing through RLE due to numbness.   Subjective/Complaints: Good BM Discussed pt's modesty re: Bathing ADLs with OT  Review of Systems  Musculoskeletal: Positive for joint pain.  All other systems reviewed and are negative.   Objective: Vital Signs: Blood pressure 138/81, pulse 99, temperature 98.7 F (37.1 C), temperature source Oral, resp. rate 18, height 6' (1.829 m), weight 114.397 kg (252 lb 3.2 oz), SpO2 95.00%. No results found. Results for orders placed during the hospital encounter of 11/06/12 (from the past 72 hour(s))  GLUCOSE, CAPILLARY     Status: Abnormal   Collection Time    11/06/12  8:33 PM      Result Value Range   Glucose-Capillary 193 (*) 70 - 99 mg/dL   Comment 1 Notify RN    PROTIME-INR     Status: Abnormal   Collection Time    11/07/12  5:00 AM      Result Value Range   Prothrombin Time 17.7 (*) 11.6 - 15.2 seconds   INR 1.50 (*) 0.00 - 1.49  GLUCOSE, CAPILLARY     Status: Abnormal   Collection Time    11/07/12  7:19 AM      Result Value Range   Glucose-Capillary 235 (*) 70 - 99 mg/dL   Comment 1 Notify RN    GLUCOSE, CAPILLARY     Status: Abnormal   Collection Time    11/07/12  4:30 PM      Result Value Range   Glucose-Capillary 149 (*) 70 - 99 mg/dL  GLUCOSE, CAPILLARY     Status: Abnormal   Collection Time    11/07/12  8:49 PM      Result Value Range   Glucose-Capillary 155 (*) 70 - 99 mg/dL  PROTIME-INR     Status: Abnormal   Collection Time    11/08/12  7:03 AM      Result Value Range    Prothrombin Time 18.1 (*) 11.6 - 15.2 seconds   INR 1.55 (*) 0.00 - 1.49  GLUCOSE, CAPILLARY     Status: Abnormal   Collection Time    11/08/12  7:14 AM      Result Value Range   Glucose-Capillary 174 (*) 70 - 99 mg/dL  GLUCOSE, CAPILLARY     Status: Abnormal   Collection Time    11/08/12 11:23 AM      Result Value Range   Glucose-Capillary 174 (*) 70 - 99 mg/dL  GLUCOSE, CAPILLARY     Status: Abnormal   Collection Time    11/08/12  4:44 PM      Result Value Range   Glucose-Capillary 187 (*) 70 - 99 mg/dL  GLUCOSE, CAPILLARY     Status: Abnormal   Collection Time    11/08/12  8:53 PM      Result Value Range   Glucose-Capillary 152 (*) 70 - 99 mg/dL  PROTIME-INR     Status: Abnormal   Collection Time    11/09/12  5:44 AM      Result Value Range   Prothrombin Time  19.5 (*) 11.6 - 15.2 seconds   INR 1.71 (*) 0.00 - 1.49  GLUCOSE, CAPILLARY     Status: Abnormal   Collection Time    11/09/12  7:40 AM      Result Value Range   Glucose-Capillary 195 (*) 70 - 99 mg/dL  CBC     Status: Abnormal   Collection Time    11/09/12  9:00 AM      Result Value Range   WBC 10.0  4.0 - 10.5 K/uL   RBC 3.03 (*) 4.22 - 5.81 MIL/uL   Hemoglobin 8.8 (*) 13.0 - 17.0 g/dL   HCT 47.8 (*) 29.5 - 62.1 %   MCV 84.5  78.0 - 100.0 fL   MCH 29.0  26.0 - 34.0 pg   MCHC 34.4  30.0 - 36.0 g/dL   RDW 30.8  65.7 - 84.6 %   Platelets 359  150 - 400 K/uL  BASIC METABOLIC PANEL     Status: Abnormal   Collection Time    11/09/12  9:00 AM      Result Value Range   Sodium 135  135 - 145 mEq/L   Potassium 4.2  3.5 - 5.1 mEq/L   Chloride 99  96 - 112 mEq/L   CO2 25  19 - 32 mEq/L   Glucose, Bld 146 (*) 70 - 99 mg/dL   BUN 22  6 - 23 mg/dL   Creatinine, Ser 9.62  0.50 - 1.35 mg/dL   Calcium 9.0  8.4 - 95.2 mg/dL   GFR calc non Af Amer 80 (*) >90 mL/min   GFR calc Af Amer >90  >90 mL/min   Comment:            The eGFR has been calculated     using the CKD EPI equation.     This calculation has not  been     validated in all clinical     situations.     eGFR's persistently     <90 mL/min signify     possible Chronic Kidney Disease.  GLUCOSE, CAPILLARY     Status: Abnormal   Collection Time    11/09/12 12:02 PM      Result Value Range   Glucose-Capillary 133 (*) 70 - 99 mg/dL  GLUCOSE, CAPILLARY     Status: Abnormal   Collection Time    11/09/12  4:59 PM      Result Value Range   Glucose-Capillary 196 (*) 70 - 99 mg/dL    HEENT: normal Lungs clear Heart regular rate and rhythm Abdomen positive bowel sounds soft nontender palpation Extremities mild to moderate knee effusion, mild erythema around incision site at knees. Decreased range of motion at the knees. Skin: Subcuticular sutures. No bleeding or drainage Motor strength is 5/5 in bilateral deltoid, biceps, triceps, grip Bilateral hip flexors 3 minus knee extensors 3 minus ankle dose flexors plantar flexor 5/5  Assessment/Plan: 1. Functional deficits secondary to bilateral TKR which require 3+ hours per day of interdisciplinary therapy in a comprehensive inpatient rehab setting. Physiatrist is providing close team supervision and 24 hour management of active medical problems listed below. Physiatrist and rehab team continue to assess barriers to discharge/monitor patient progress toward functional and medical goals. FIM: FIM - Bathing Bathing: 0: Activity did not occur  FIM - Upper Body Dressing/Undressing Upper body dressing/undressing steps patient completed: Thread/unthread left sleeve of pullover shirt/dress;Thread/unthread right sleeve of pullover shirt/dresss;Put head through opening of pull over shirt/dress;Pull shirt over trunk Upper  body dressing/undressing: 5: Supervision: Safety issues/verbal cues FIM - Lower Body Dressing/Undressing Lower body dressing/undressing steps patient completed: Don/Doff right sock;Don/Doff left sock Lower body dressing/undressing: 5: Set-up assist to: Don/Doff TED stocking  FIM -  Toileting Toileting steps completed by patient: Performs perineal hygiene;Adjust clothing prior to toileting;Adjust clothing after toileting Toileting Assistive Devices: Grab bar or rail for support Toileting: 5: Supervision: Safety issues/verbal cues  FIM - Diplomatic Services operational officer Devices: Elevated toilet seat;Walker Toilet Transfers: 5-To toilet/BSC: Supervision (verbal cues/safety issues);5-From toilet/BSC: Supervision (verbal cues/safety issues)  FIM - Banker Devices: Walker;Arm rests Bed/Chair Transfer: 4: Supine > Sit: Min A (steadying Pt. > 75%/lift 1 leg);4: Sit > Supine: Min A (steadying pt. > 75%/lift 1 leg);4: Chair or W/C > Bed: Min A (steadying Pt. > 75%);4: Bed > Chair or W/C: Min A (steadying Pt. > 75%)  FIM - Locomotion: Wheelchair Distance: 150 Locomotion: Wheelchair: 0: Activity did not occur FIM - Locomotion: Ambulation Locomotion: Ambulation Assistive Devices: Designer, industrial/product Ambulation/Gait Assistance: 4: Min guard Locomotion: Ambulation: 4: Travels 150 ft or more with minimal assistance (Pt.>75%)  Comprehension Comprehension Mode: Auditory Comprehension: 7-Follows complex conversation/direction: With no assist  Expression Expression Mode: Verbal Expression: 7-Expresses complex ideas: With no assist  Social Interaction Social Interaction: 7-Interacts appropriately with others - No medications needed.  Problem Solving Problem Solving: 7-Solves complex problems: Recognizes & self-corrects  Memory Memory: 7-Complete Independence: No helper  Medical Problem List and Plan:  1. DVT Prophylaxis/Anticoagulation: Pharmaceutical: Coumadin sub therapeutic cont Lovenox  2. Pain Management: Continue oxycodone as needed. Complaining of muscle aches. Will schedule robaxin.  3. Mood: Seems to be motivated. No signs of distress. Will monitor and have LCSW follow up for evaluation.  4. Neuropsych: This patient  is capable of making decisions on his own behalf.  5. DM type 2: Will monitor with AC/HS cbg checks. Continue tradenta and metformin. Use SSI for elevated BS.Uncontrolled will increase Glucotrol  Anticipate BS to improve with increase in activity.  6. HTN: Monitor BS on bid basis. Will check orthostatic BP/Pulse. Set parameters for Norvasc, HCTZ, and Lotensin.  -anemia component to symptoms, encourage fluids as well.  7. ABLA: add iron supplement. Recheck CBC 05/12.  8. Leucocytosis: Likely reactive. Will check UA/UCS.  9. Constipation: Will schedule miralax. Prn suppository   LOS (Days) 3 A FACE TO FACE EVALUATION WAS PERFORMED  Gilmore List E 11/09/2012, 6:29 PM

## 2012-11-09 NOTE — Progress Notes (Signed)
[facility] Individual Statement of Services  Patient Name:  Leonard [name]  Date:  11/09/2012  Welcome to the [facility].  Our goal is to provide you with an individualized program based on your diagnosis and situation, designed to meet your specific needs.  With this comprehensive rehabilitation program, you will be expected to participate in at least 3 hours of rehabilitation therapies Monday-Friday, with modified therapy programming on the weekends.  Your rehabilitation program will include the following services:  Physical Therapy (PT), Occupational Therapy (OT), 24 hour per day rehabilitation nursing, Therapeutic Recreaction (TR), Case Management ( Social Worker), Rehabilitation Medicine, Nutrition Services and Pharmacy Services  Weekly team conferences will be held on Wednesdays to discuss your progress.  Your Social Worker will talk with you frequently to get your input and to update you on team discussions.  Team conferences with you and your family in attendance may also be held.  Expected length of stay: 10 days  Overall anticipated outcome: modified independent  Depending on your progress and recovery, your program may change. Your Social Worker will coordinate services and will keep you informed of any changes. Your Social Worker's name and contact numbers are listed  below.  The following services may also be recommended but are not provided by the [facility]:   Driving Evaluations  Home Health Rehabiltiation Services  Outpatient Rehabilitatation [facility]  Vocational Rehabilitation   Arrangements will be made to provide these services after discharge if needed.  Arrangements include referral to agencies that provide these services.  Your insurance has been verified to be:  [employer] Your primary doctor is:  Dr. Drue Novel  Pertinent information will be shared with your doctor and your insurance  company.  Social Worker:  Dossie Der, [place] [phone]   Information discussed with and copy given to patient by: Amada Jupiter, 11/09/2012, 3:53 PM

## 2012-11-09 NOTE — Progress Notes (Signed)
Social Work Patient ID: Leonard [name], male   DOB: June 15, 1956, 57 y.o.   MRN: [id number]   Have reviewed team conference with patient who is aware and agreeable with targeted d/c date of 5/22 and mod i goals.  Aware that Dossie Der, LCSW will follow his case through to d/c.  Dniya Neuhaus, LCSW

## 2012-11-09 NOTE — Progress Notes (Signed)
Physical Therapy Session Note  Patient Details  Name: Leonard [name] MRN: [id number] Date of Birth: 04/08/1956  Today's Date: 11/09/2012 Time: 1100-1200 and 1330-1420  Time Calculation (min): 60 min and 50 min  Short Term Goals: Week 1:  PT Short Term Goal 1 (Week 1): STGs=LTGs due to ELOS  Skilled Therapeutic Interventions/Progress Updates:    AM Session: Patient received supine in bed. Patient able to perform SLR on R LE with <10 degree lag. Discontinued use of R Knee Immobilizer. Patient unable to perform SLR on L LE, maintain use of L knee immobilizer.  Patient exercised on NuStep Level 2 x10' with B UE and B LE to facilitate improved B knee flexion/extension. Patient performed x10 SLR on R LE, x10 quad sets with 5" hold on L LE. Knee ROM: R knee: PROM flexion: 79 degrees; AROM extension: lacking 6 degrees ext L knee: PROM flexion: 60 degrees; AROM extension: lacking 9 degrees ext  PM Session: Patient received from OT, sitting on mat. Prolonged bouts of B knee extension stretching: R ankle on small bolster, L ankle on towel roll (patient unable to tolerate bolster). B PROM knee extension facilitated by therapist for last 10 seconds of self stretch. B knee flexion stretching seated edge of mat with pillow case under foot to facilitate sliding foot into increased knee flexion. PT facilitates increased stretching. Patient instructed in gait training x150' with RW and min guard, see details below.  Therapy Documentation Precautions:  Precautions Precautions: Knee;Fall Required Braces or Orthoses: Knee Immobilizer - Left Knee Immobilizer - Right:  (Discontinued R KI 11/09/12; pt able to perform SLR on R) Knee Immobilizer - Left: Discontinue once straight leg raise with < 10 degree lag Restrictions Weight Bearing Restrictions: No RLE Weight Bearing: Weight bearing as tolerated LLE Weight Bearing: Weight bearing as tolerated Pain: Pain Assessment Pain Assessment: 0-10 Pain Score:    7 Pain Type: Surgical pain Pain Location: Knee Pain Orientation: Right;Left Pain Descriptors: Aching Pain Frequency: Intermittent Pain Onset: Gradual Pain Intervention(s): RN made aware;Repositioned;Ambulation/increased activity Multiple Pain Sites: No Locomotion : Ambulation Ambulation: Yes Ambulation/Gait Assistance: 4: Min guard Ambulation Distance (Feet): 150 Feet x3 Assistive device: Rolling walker Ambulation/Gait Assistance Details: Verbal cues for precautions/safety;Verbal cues for gait pattern Ambulation/Gait Assistance Details: Verbal cues for increased R knee flexion secondary to R knee immobilizer being discontinued. Verbal cues for increasing push off during terminal stance. Gait Gait: Yes Gait Pattern: Impaired Gait Pattern: Trunk flexed;Decreased stride length;Decreased step length - left;Decreased step length - right;Decreased hip/knee flexion - left;Decreased hip/knee flexion - right;Step-through pattern;Wide base of support   See FIM for current functional status  Therapy/Group: Individual Therapy  Chipper Herb. Zeya Balles, PT, DPT 11/09/2012, 1:31 PM

## 2012-11-10 ENCOUNTER — Inpatient Hospital Stay (HOSPITAL_COMMUNITY): Payer: BC Managed Care – PPO | Admitting: *Deleted

## 2012-11-10 ENCOUNTER — Inpatient Hospital Stay (HOSPITAL_COMMUNITY): Payer: BC Managed Care – PPO | Admitting: Occupational Therapy

## 2012-11-10 ENCOUNTER — Inpatient Hospital Stay (HOSPITAL_COMMUNITY): Payer: BC Managed Care – PPO

## 2012-11-10 DIAGNOSIS — M1710 Unilateral primary osteoarthritis, unspecified knee: Secondary | ICD-10-CM

## 2012-11-10 DIAGNOSIS — D62 Acute posthemorrhagic anemia: Secondary | ICD-10-CM

## 2012-11-10 DIAGNOSIS — Z96659 Presence of unspecified artificial knee joint: Secondary | ICD-10-CM

## 2012-11-10 LAB — PROTIME-INR: INR: 1.85 — ABNORMAL HIGH (ref 0.00–1.49)

## 2012-11-10 LAB — GLUCOSE, CAPILLARY: Glucose-Capillary: 203 mg/dL — ABNORMAL HIGH (ref 70–99)

## 2012-11-10 MED ORDER — WARFARIN SODIUM 2.5 MG PO TABS
12.5000 mg | ORAL_TABLET | Freq: Once | ORAL | Status: AC
  Administered 2012-11-10: 12.5 mg via ORAL
  Filled 2012-11-10: qty 1

## 2012-11-10 MED ORDER — SALINE SPRAY 0.65 % NA SOLN
2.0000 | Freq: Three times a day (TID) | NASAL | Status: DC
  Administered 2012-11-10 – 2012-11-13 (×14): 2 via NASAL
  Filled 2012-11-10: qty 44

## 2012-11-10 NOTE — Progress Notes (Signed)
ANTICOAGULATION CONSULT NOTE - Follow Up Consult  Pharmacy Consult for coumadin Indication: VTE prophylaxis  Allergies  Allergen Reactions  . Ultram (Tramadol)     Moderate disorientation    Patient Measurements: Height: 6' (182.9 cm) Weight: 252 lb 3.2 oz (114.397 kg) IBW/kg (Calculated) : 77.6 Heparin Dosing Weight:   Vital Signs: Temp: 98 F (36.7 C) (05/16 0501) Temp src: Oral (05/16 0501) BP: 126/80 mmHg (05/16 0501) Pulse Rate: 93 (05/16 0501)  Labs:  Recent Labs  11/08/12 0703 11/09/12 0544 11/09/12 0900 11/10/12 0518  HGB  --   --  8.8*  --   HCT  --   --  25.6*  --   PLT  --   --  359  --   LABPROT 18.1* 19.5*  --  20.7*  INR 1.55* 1.71*  --  1.85*  CREATININE  --   --  1.02  --     Estimated Creatinine Clearance: 105.6 ml/min (by C-G formula based on Cr of 1.02).   Medications:  Scheduled:  . allopurinol  100 mg Oral QAC breakfast  . amLODipine  10 mg Oral QHS  . atorvastatin  20 mg Oral QHS  . enoxaparin (LOVENOX) injection  30 mg Subcutaneous Q12H  . feeding supplement  120 mL Oral QID  . glipiZIDE  10 mg Oral QAC breakfast  . hydrochlorothiazide  25 mg Oral QAC breakfast  . insulin aspart  0-20 Units Subcutaneous TID WC  . insulin aspart  0-5 Units Subcutaneous QHS  . iron polysaccharides  150 mg Oral BID AC  . linagliptin  5 mg Oral QAC breakfast   And  . metFORMIN  500 mg Oral BID WC  . lisinopril  40 mg Oral QHS  . methocarbamol  500-1,000 mg Oral QID  . OxyCODONE  10 mg Oral Q12H  . polyethylene glycol  17 g Oral BID  . potassium citrate  10 mEq Oral BID  . tamsulosin  0.4 mg Oral QHS  . Warfarin - Pharmacist Dosing Inpatient   Does not apply q1800   Infusions:    Assessment: 57 yo male s/p TKA is currently on subtherapeutic coumadin.  INR up to 1.85 from 1.71 Goal of Therapy:  INR 2-3 Monitor platelets by anticoagulation protocol: Yes   Plan:  1) Coumadin 12.5 mg po x1 2) Lovenox 30 mg SQ q 12 h until INR 2.0 or above, then  discontinue.   Leonard [name], Tsz-Yin 11/10/2012,8:42 AM

## 2012-11-10 NOTE — Progress Notes (Signed)
Patient ID: Leonard [name], male   DOB: 10-Feb-1956, 57 y.o.   MRN: [id number] 57 y.o. male with history of HTN, DM, bilateral knee pain due to OA with significant varus deformity and failure of conservative therapy. Patient elected to undergo B-TKR on 11/01/12 by Dr. Lequita Halt. Post op with hypotension requiring IVF and complaints of numbness RLE. Epidural cath removed 05/09. He was noted to have presyncopal episode with therapy as well as problems weight bearing through RLE due to numbness.   Subjective/Complaints: Good BM Patient is a history of epistaxis but no recent episodes except for yesterday which was controlled with pressure. Concern about hemoglobin Discussed bmet discussed diabetic management  Review of Systems  Musculoskeletal: Positive for joint pain.  All other systems reviewed and are negative.   Objective: Vital Signs: Blood pressure 126/80, pulse 93, temperature 98 F (36.7 C), temperature source Oral, resp. rate 18, height 6' (1.829 m), weight 114.397 kg (252 lb 3.2 oz), SpO2 93.00%. No results found. Results for orders placed during the hospital encounter of 11/06/12 (from the past 72 hour(s))  GLUCOSE, CAPILLARY     Status: Abnormal   Collection Time    11/07/12  4:30 PM      Result Value Range   Glucose-Capillary 149 (*) 70 - 99 mg/dL  GLUCOSE, CAPILLARY     Status: Abnormal   Collection Time    11/07/12  8:49 PM      Result Value Range   Glucose-Capillary 155 (*) 70 - 99 mg/dL  PROTIME-INR     Status: Abnormal   Collection Time    11/08/12  7:03 AM      Result Value Range   Prothrombin Time 18.1 (*) 11.6 - 15.2 seconds   INR 1.55 (*) 0.00 - 1.49  GLUCOSE, CAPILLARY     Status: Abnormal   Collection Time    11/08/12  7:14 AM      Result Value Range   Glucose-Capillary 174 (*) 70 - 99 mg/dL  GLUCOSE, CAPILLARY     Status: Abnormal   Collection Time    11/08/12 11:23 AM      Result Value Range   Glucose-Capillary 174 (*) 70 - 99 mg/dL  GLUCOSE,  CAPILLARY     Status: Abnormal   Collection Time    11/08/12  4:44 PM      Result Value Range   Glucose-Capillary 187 (*) 70 - 99 mg/dL  GLUCOSE, CAPILLARY     Status: Abnormal   Collection Time    11/08/12  8:53 PM      Result Value Range   Glucose-Capillary 152 (*) 70 - 99 mg/dL  PROTIME-INR     Status: Abnormal   Collection Time    11/09/12  5:44 AM      Result Value Range   Prothrombin Time 19.5 (*) 11.6 - 15.2 seconds   INR 1.71 (*) 0.00 - 1.49  GLUCOSE, CAPILLARY     Status: Abnormal   Collection Time    11/09/12  7:40 AM      Result Value Range   Glucose-Capillary 195 (*) 70 - 99 mg/dL  CBC     Status: Abnormal   Collection Time    11/09/12  9:00 AM      Result Value Range   WBC 10.0  4.0 - 10.5 K/uL   RBC 3.03 (*) 4.22 - 5.81 MIL/uL   Hemoglobin 8.8 (*) 13.0 - 17.0 g/dL   HCT 52.8 (*) 41.3 - 24.4 %   MCV  84.5  78.0 - 100.0 fL   MCH 29.0  26.0 - 34.0 pg   MCHC 34.4  30.0 - 36.0 g/dL   RDW 09.8  11.9 - 14.7 %   Platelets 359  150 - 400 K/uL  BASIC METABOLIC PANEL     Status: Abnormal   Collection Time    11/09/12  9:00 AM      Result Value Range   Sodium 135  135 - 145 mEq/L   Potassium 4.2  3.5 - 5.1 mEq/L   Chloride 99  96 - 112 mEq/L   CO2 25  19 - 32 mEq/L   Glucose, Bld 146 (*) 70 - 99 mg/dL   BUN 22  6 - 23 mg/dL   Creatinine, Ser 8.29  0.50 - 1.35 mg/dL   Calcium 9.0  8.4 - 56.2 mg/dL   GFR calc non Af Amer 80 (*) >90 mL/min   GFR calc Af Amer >90  >90 mL/min   Comment:            The eGFR has been calculated     using the CKD EPI equation.     This calculation has not been     validated in all clinical     situations.     eGFR's persistently     <90 mL/min signify     possible Chronic Kidney Disease.  GLUCOSE, CAPILLARY     Status: Abnormal   Collection Time    11/09/12 12:02 PM      Result Value Range   Glucose-Capillary 133 (*) 70 - 99 mg/dL  GLUCOSE, CAPILLARY     Status: Abnormal   Collection Time    11/09/12  4:59 PM      Result  Value Range   Glucose-Capillary 196 (*) 70 - 99 mg/dL  GLUCOSE, CAPILLARY     Status: Abnormal   Collection Time    11/09/12  8:20 PM      Result Value Range   Glucose-Capillary 142 (*) 70 - 99 mg/dL   Comment 1 Notify RN    PROTIME-INR     Status: Abnormal   Collection Time    11/10/12  5:18 AM      Result Value Range   Prothrombin Time 20.7 (*) 11.6 - 15.2 seconds   INR 1.85 (*) 0.00 - 1.49  GLUCOSE, CAPILLARY     Status: Abnormal   Collection Time    11/10/12  7:17 AM      Result Value Range   Glucose-Capillary 203 (*) 70 - 99 mg/dL   Comment 1 Notify RN      HEENT: normal Lungs clear Heart regular rate and rhythm Abdomen positive bowel sounds soft nontender palpation Extremities mild to moderate knee effusion, mild erythema around incision site at knees. Decreased range of motion at the knees. Skin: Subcuticular sutures. No bleeding or drainage Motor strength is 5/5 in bilateral deltoid, biceps, triceps, grip Bilateral hip flexors 3 minus knee extensors 3 minus ankle dose flexors plantar flexor 5/5  Assessment/Plan: 1. Functional deficits secondary to bilateral TKR which require 3+ hours per day of interdisciplinary therapy in a comprehensive inpatient rehab setting. Physiatrist is providing close team supervision and 24 hour management of active medical problems listed below. Physiatrist and rehab team continue to assess barriers to discharge/monitor patient progress toward functional and medical goals. FIM: FIM - Bathing Bathing Steps Patient Completed: Chest;Right Arm;Left Arm;Abdomen;Front perineal area;Left lower leg (including foot);Right lower leg (including foot);Left upper leg;Right upper leg;Buttocks Bathing:  5: Supervision: Safety issues/verbal cues  FIM - Upper Body Dressing/Undressing Upper body dressing/undressing steps patient completed: Thread/unthread right sleeve of pullover shirt/dresss;Thread/unthread left sleeve of pullover shirt/dress;Put head through  opening of pull over shirt/dress;Pull shirt over trunk Upper body dressing/undressing: 5: Supervision: Safety issues/verbal cues FIM - Lower Body Dressing/Undressing Lower body dressing/undressing steps patient completed: Thread/unthread right pants leg;Thread/unthread left pants leg;Pull pants up/down;Don/Doff right sock;Don/Doff left sock Lower body dressing/undressing: 5: Supervision: Safety issues/verbal cues  FIM - Toileting Toileting steps completed by patient: Adjust clothing prior to toileting;Adjust clothing after toileting;Performs perineal hygiene Toileting Assistive Devices: Grab bar or rail for support Toileting: 5: Supervision: Safety issues/verbal cues  FIM - Diplomatic Services operational officer Devices: Elevated toilet seat Toilet Transfers: 5-To toilet/BSC: Supervision (verbal cues/safety issues)  FIM - Banker Devices: Environmental consultant;Arm rests Bed/Chair Transfer: 5: Supine > Sit: Supervision (verbal cues/safety issues)  FIM - Locomotion: Wheelchair Distance: 150 Locomotion: Wheelchair: 0: Activity did not occur FIM - Locomotion: Ambulation Locomotion: Ambulation Assistive Devices: Designer, industrial/product Ambulation/Gait Assistance: 4: Min guard Locomotion: Ambulation: 4: Travels 150 ft or more with minimal assistance (Pt.>75%)  Comprehension Comprehension Mode: Auditory Comprehension: 7-Follows complex conversation/direction: With no assist  Expression Expression Mode: Verbal Expression: 7-Expresses complex ideas: With no assist  Social Interaction Social Interaction: 7-Interacts appropriately with others - No medications needed.  Problem Solving Problem Solving: 7-Solves complex problems: Recognizes & self-corrects  Memory Memory: 7-Complete Independence: No helper  Medical Problem List and Plan:  1. DVT Prophylaxis/Anticoagulation: Pharmaceutical: Coumadin sub therapeutic cont Lovenox  2. Pain Management: Continue  oxycodone as needed. Complaining of muscle aches. Will schedule robaxin.  3. Mood: Seems to be motivated. No signs of distress. Will monitor and have LCSW follow up for evaluation.  4. Neuropsych: This patient is capable of making decisions on his own behalf.  5. DM type 2: Will monitor with AC/HS cbg checks. Continue tradenta and metformin. Use SSI for elevated BS.Uncontrolled will increase Glucotrol  Anticipate BS to improve with increase in activity.  6. HTN: Monitor BS on bid basis. Will check orthostatic BP/Pulse. Set parameters for Norvasc, HCTZ, and Lotensin.  -anemia component to symptoms, encourage fluids as well.  7. ABLA: add iron supplement. Recheck CBC 05/19. Check stool for occult blood  8. Leucocytosis: Likely reactive. Will check UA/UCS.  9. Constipation: Will schedule miralax. Prn suppository 10. Hyponatremia resolved after IV fluids  LOS (Days) 4 A FACE TO FACE EVALUATION WAS PERFORMED  Jovi Alvizo E 11/10/2012, 9:52 AM

## 2012-11-10 NOTE — Progress Notes (Signed)
Occupational Therapy Session Note  Patient Details  Name: Leonard [name] MRN: [id number] Date of Birth: 1956-02-24  Today's Date: 11/10/2012 Time: 0800-0857 Time Calculation (min): 57 min  Short Term Goals: Week 1:  OT Short Term Goal 1 (Week 1): Pt will complete LB dressing and bathing tasks with min assist using AE/DME prn OT Short Term Goal 2 (Week 1): Pt will tolerate standing at sink for 3 min during grooming task.  OT Short Term Goal 3 (Week 1): Pt will tolerate standing in shower with min-steadying assist to wash 4/10 body parts  OT Short Term Goal 4 (Week 1): Pt will complete toilet transfer with supervision assist   Skilled Therapeutic Interventions/Progress Updates:    Pt performed bathing and dressing this session.  Used 3:1 in the shower for washing pt overall supervision level for all aspects of bathing including peri area in standing with hand on the 3:1 arm.  Performed dressing from 3:1 as well.  Able to donn socks and shorts over feet with supervision and increased time.  Min instructional cueing to start shorts over the slightly weaker left leg first.  Needed increased time for each task but did not use AE.  Stood to complete grooming tasks at the sink.  Pt transferred to recliner chair at end of session and ice was applied to the LLE.  Therapy Documentation Precautions:  Precautions Precautions: Knee;Fall Precaution Comments: Instructed pt on KI use for amb until able to perform 10 active SLR Ind Required Braces or Orthoses: Knee Immobilizer - Left Knee Immobilizer - Right:  (Discontinued R KI 11/09/12; pt able to perform SLR on R) Knee Immobilizer - Left: Discontinue once straight leg raise with < 10 degree lag Restrictions Weight Bearing Restrictions: No RLE Weight Bearing: Weight bearing as tolerated LLE Weight Bearing: Weight bearing as tolerated  Pain: Pain Assessment Pain Assessment: 0-10 Pain Score:   2 Pain Type: Surgical pain Pain Location:  Knee Pain Orientation: Right;Left Pain Intervention(s): Medication (See eMAR);Repositioned;Cold applied ADL: See FIM for current functional status  Therapy/Group: Individual Therapy  Olon Russ OTR/L 11/10/2012, 9:01 AM

## 2012-11-10 NOTE — Progress Notes (Signed)
Pt. Has his CPAP & mask from home set up at the bedside. Pt. Stated that he would place himself on CPAP when ready for bed. Pt. Was made aware to call RT if he needed assistance with CPAP.

## 2012-11-10 NOTE — Progress Notes (Signed)
Social Work Patient ID: Margarita Mail, male   DOB: 06-05-1956, 57 y.o.   MRN: [id number] Met with pt and team who reports meeting goals sooner and feel would be ready for discharge Tues.  Spoke with MD and PA and they report medically stable and Will be ready on Tues.  Will order DME and inform [facility] regarding his discharge.  Genevieve Norlander to order CPM machines for home use.  Pt happy with plan.  Left message for his wife awaiting return call.

## 2012-11-10 NOTE — Progress Notes (Signed)
Physical Therapy Session Note  Patient Details  Name: Leonard [name] MRN: [id number] Date of Birth: 08-25-1955  Today's Date: 11/10/2012 Time: 1030-1130 and 1400-1445 Time Calculation (min): 60 min and 45 min  Short Term Goals: Week 1:  PT Short Term Goal 1 (Week 1): STGs=LTGs due to ELOS  Skilled Therapeutic Interventions/Progress Updates:    AM Session: Patient received sitting in recliner. This session focused on gait training and ROM B knees. See details below for gait training. Supine on mat: prolonged calf self-stretch with use of sheet. Sitting edge of mat: heels slides with pillow case under foot to facilitate improved knee flexion. 10 reps with 10" hold in flexion, then x5 passive knee flexion stretching facilitated by PT with 10" hold each knee. Patient provided sheet to assist with calf stretching and facilitate heel slides for knee flexion while seated in recliner. Recommended patient perform these exercises between therapy session secondary to slow progress with B knee flexion. Patient returned to room and left seated in recliner.  PM Session: Patient received sitting in recliner. This session focused on gait training, ROM B knees, car transfer, and stair negotiation. See details below for gait training. Patient negotiated 6 stairs with B handrails and min guard and 6 stairs with one handrail laterally with min guard. Patient performed car transfer to midsize SUV height with RW and supervision. Patient exercised on NuStep Level 2 with B UE and B LE to facilitate improved B knee ROM. Patient returned to room and left seated in recliner.  Discussion with patient, OT, and SW about moving up patient's discharge date since he was able to negotiate 6 steps with one handrail and min guard. Patient has 6 STE with one handrail. MD approved change in discharge date and planned discharge set for Tuesday, 11/14/12 now. Patient aware and in agreement.  Therapy Documentation Precautions:   Precautions Precautions: Knee;Fall Required Braces or Orthoses: Knee Immobilizer - Left Knee Immobilizer - Right:  (Discontinued R KI 11/09/12; pt able to perform SLR on R) Knee Immobilizer - Left: Discontinue once straight leg raise with < 10 degree lag Restrictions Weight Bearing Restrictions: No RLE Weight Bearing: Weight bearing as tolerated LLE Weight Bearing: Weight bearing as tolerated Pain: Pain Assessment Pain Assessment: 0-10 Pain Score:   8 Pain Type: Surgical pain Pain Location: Knee Pain Orientation: Right;Left Pain Descriptors: Aching Pain Onset: With Activity Pain Intervention(s): Ambulation/increased activity;Repositioned;RN made aware Multiple Pain Sites: No Locomotion : Ambulation Ambulation: Yes Ambulation/Gait Assistance: 4: Min guard Ambulation Distance (Feet): 150 Feetx4 Assistive device: Rolling walker Ambulation/Gait Assistance Details: Verbal cues for precautions/safety;Verbal cues for gait pattern Ambulation/Gait Assistance Details: Verbal cues for increased R knee flexion, upright posture, and increased push off during terminal stance. Gait Gait: Yes Gait Pattern: Impaired Gait Pattern: Trunk flexed;Decreased stride length;Decreased step length - left;Decreased step length - right;Decreased hip/knee flexion - left;Decreased hip/knee flexion - right;Step-through pattern;Wide base of support   See FIM for current functional status  Therapy/Group: Individual Therapy  Chipper Herb. Hermann Dottavio, PT, DPT  11/10/2012, 12:26 PM

## 2012-11-10 NOTE — Progress Notes (Signed)
Occupational Therapy Session Note  Patient Details  Name: Leonard [name] MRN: [id number] Date of Birth: 29-Feb-1956  Today's Date: 11/10/2012 Time: 1302-1330 Time Calculation (min): 28 min  Short Term Goals: Week 1:  OT Short Term Goal 1 (Week 1): Pt will complete LB dressing and bathing tasks with min assist using AE/DME prn OT Short Term Goal 2 (Week 1): Pt will tolerate standing at sink for 3 min during grooming task.  OT Short Term Goal 3 (Week 1): Pt will tolerate standing in shower with min-steadying assist to wash 4/10 body parts  OT Short Term Goal 4 (Week 1): Pt will complete toilet transfer with supervision assist   Skilled Therapeutic Interventions/Progress Updates:    Therapy session focused on dynamic standing balance, functional ambulation, and ROM in BLE to facilitate functional transfers. Pt engaged in leisure task of watering plants while picking up water pail from floor between each plant to facilitate BLE ROM. Engaged in task for approx 10 min before requiring rest breaks. Engaged in home management task of loading and unloading front open dryer with verbal cues to bend at knees. Pt required 2 rest breaks throughout therapy session. Pt ambulated from room to day room and back with RW and supervision.   Therapy Documentation Precautions:  Precautions Precautions: Knee;Fall Precaution Comments: Instructed pt on KI use for amb until able to perform 10 active SLR Ind Required Braces or Orthoses: Knee Immobilizer - Left Knee Immobilizer - Right:  (Discontinued R KI 11/09/12; pt able to perform SLR on R) Knee Immobilizer - Left: Discontinue once straight leg raise with < 10 degree lag Restrictions Weight Bearing Restrictions: No RLE Weight Bearing: Weight bearing as tolerated LLE Weight Bearing: Weight bearing as tolerated General:   Vital Signs: Therapy Vitals Temp: 97.4 F (36.3 C) Temp src: Oral Pulse Rate: 103 Resp: 18 BP: 136/74 mmHg Patient Position, if  appropriate: Sitting Oxygen Therapy SpO2: 95 % Pain: Pain Assessment Pain Assessment: 0-10 Pain Score:   5 Pain Type: Surgical pain Pain Location: Knee Pain Orientation: Right;Left;Anterior;Posterior Pain Descriptors: Aching Pain Onset: With Activity Pain Intervention(s): Ambulation/increased activity;Medication (See eMAR);Cold applied Multiple Pain Sites: No ADL:   Exercises:   Other Treatments:    See FIM for current functional status  Therapy/Group: Individual Therapy  Daneil Dan 11/10/2012, 3:14 PM

## 2012-11-11 ENCOUNTER — Inpatient Hospital Stay (HOSPITAL_COMMUNITY): Payer: BC Managed Care – PPO | Admitting: Occupational Therapy

## 2012-11-11 ENCOUNTER — Inpatient Hospital Stay (HOSPITAL_COMMUNITY): Payer: BC Managed Care – PPO | Admitting: *Deleted

## 2012-11-11 DIAGNOSIS — M1710 Unilateral primary osteoarthritis, unspecified knee: Secondary | ICD-10-CM

## 2012-11-11 DIAGNOSIS — D62 Acute posthemorrhagic anemia: Secondary | ICD-10-CM

## 2012-11-11 DIAGNOSIS — Z96659 Presence of unspecified artificial knee joint: Secondary | ICD-10-CM

## 2012-11-11 LAB — GLUCOSE, CAPILLARY
Glucose-Capillary: 194 mg/dL — ABNORMAL HIGH (ref 70–99)
Glucose-Capillary: 121 mg/dL — ABNORMAL HIGH (ref 70–99)
Glucose-Capillary: 165 mg/dL — ABNORMAL HIGH (ref 70–99)
Glucose-Capillary: 182 mg/dL — ABNORMAL HIGH (ref 70–99)

## 2012-11-11 LAB — PROTIME-INR: Prothrombin Time: 22.9 seconds — ABNORMAL HIGH (ref 11.6–15.2)

## 2012-11-11 MED ORDER — WARFARIN SODIUM 10 MG PO TABS
12.5000 mg | ORAL_TABLET | Freq: Once | ORAL | Status: AC
  Administered 2012-11-11: 12.5 mg via ORAL
  Filled 2012-11-11: qty 1

## 2012-11-11 NOTE — Progress Notes (Signed)
Occupational Therapy Session Note  Patient Details  Name: Leonard [name] MRN: [id number] Date of Birth: [birth year]  Today's Date: 11/11/2012 Time: 6213-0865 Time Calculation (min): 66 min  Skilled Therapeutic Interventions/Progress Updates: PM session:    Patient seen for functional mobility tasks room to kitchen and ADL apartment for standing and activity tolerance walker level as he takes a major role in house keeping.  Patient fatigued and sweating but exhibited very good endurance and activity tolerance for cleaning and straightening.  Patient took one 5 minute rest break due to c/os left knee pain (3/10), stiffness and fatigue.        Therapy Documentation Precautions:  Precautions Precautions: Knee;Fall Precaution Comments: Instructed pt on KI use for amb until able to perform 10 active SLR Ind Required Braces or Orthoses: Knee Immobilizer - Left Knee Immobilizer - Right:  (Discontinued R KI 11/09/12; pt able to perform SLR on R) Knee Immobilizer - Left: Discontinue once straight leg raise with < 10 degree lag Restrictions Weight Bearing Restrictions: No RLE Weight Bearing: Weight bearing as tolerated LLE Weight Bearing: Weight bearing as tolerated   Pain: 3/10 "constant in the whole knee".   Due for pain meds at the end this session  See FIM for current functional status  Therapy/Group: Individual Therapy  Bud Face [facility] 11/11/2012, 3:08 PM

## 2012-11-11 NOTE — Progress Notes (Signed)
ANTICOAGULATION CONSULT NOTE - Follow Up Consult  Pharmacy Consult for coumadin Indication: VTE prophylaxis  Allergies  Allergen Reactions  . Ultram (Tramadol)     Moderate disorientation    Patient Measurements: Height: 6' (182.9 cm) Weight: 252 lb 3.2 oz (114.397 kg) IBW/kg (Calculated) : 77.6 Heparin Dosing Weight:   Vital Signs: Temp: 98.1 F (36.7 C) (05/17 0645) Temp src: Oral (05/17 0645) BP: 148/90 mmHg (05/17 0646) Pulse Rate: 112 (05/17 0646)  Labs:  Recent Labs  11/09/12 0544 11/09/12 0900 11/10/12 0518 11/11/12 0550  HGB  --  8.8*  --   --   HCT  --  25.6*  --   --   PLT  --  359  --   --   LABPROT 19.5*  --  20.7* 22.9*  INR 1.71*  --  1.85* 2.13*  CREATININE  --  1.02  --   --     Estimated Creatinine Clearance: 105.6 ml/min (by C-G formula based on Cr of 1.02).   Medications:  Scheduled:  . allopurinol  100 mg Oral QAC breakfast  . amLODipine  10 mg Oral QHS  . atorvastatin  20 mg Oral QHS  . enoxaparin (LOVENOX) injection  30 mg Subcutaneous Q12H  . feeding supplement  120 mL Oral QID  . glipiZIDE  10 mg Oral QAC breakfast  . hydrochlorothiazide  25 mg Oral QAC breakfast  . insulin aspart  0-20 Units Subcutaneous TID WC  . insulin aspart  0-5 Units Subcutaneous QHS  . iron polysaccharides  150 mg Oral BID AC  . linagliptin  5 mg Oral QAC breakfast   And  . metFORMIN  500 mg Oral BID WC  . lisinopril  40 mg Oral QHS  . methocarbamol  500-1,000 mg Oral QID  . OxyCODONE  10 mg Oral Q12H  . polyethylene glycol  17 g Oral BID  . potassium citrate  10 mEq Oral BID  . sodium chloride  2 spray Each Nare TID AC & HS  . tamsulosin  0.4 mg Oral QHS  . Warfarin - Pharmacist Dosing Inpatient   Does not apply q1800   Infusions:    Assessment: 57 yo male s/p TKA is currently on subtherapeutic coumadin.  INR increased to goal today.  No complications noted.  Goal of Therapy:  INR 2-3 Monitor platelets by anticoagulation protocol: Yes   Plan:   - Coumadin 12.5 mg po x1 - D/c lovenox with INR >2 today  Jill Side L. Illene Bolus, PharmD, BCPS Clinical Pharmacist Pager: [phone] Pharmacy: [phone] 11/11/2012 11:04 AM

## 2012-11-11 NOTE — Progress Notes (Signed)
Physical Therapy Session Note  Patient Details  Name: Leonard [name] MRN: [id number] Date of Birth: 09-11-1955  Today's Date: 11/11/2012 Time: 9:02-10:00 ( )  And 16:05-16:35 ( )   Short Term Goals: Week 1:  PT Short Term Goal 1 (Week 1): STGs=LTGs due to ELOS  Skilled Therapeutic Interventions/Progress Updates:  1:2 Tx focused on ROM, gait training, and stairs.  Sit<>stand transfers from various surfaces and heights with close S and increased time required. Educated pt on importance of continuing to incorporate functional flexion into all transfers as able over time.  Gait training 1x200' and 2x100' with RW and close S. Pt needing cues for posture, knee flexion, and RW proximity, but is able to verbalize all safe technique.   Nustep L 2 x16min with bil UE and LE for increased bil flexion.  Sit>supine with Min A for LLE lifting.   Supine ankle pumps, quad sets with 5 sec holds x20, flexion stretch with sheet bil 3x60' with therapist providing overpressure from foot throughout stretch at end range. Rest breaks needed throughout due to pain.   Locked bed in knee ext.   2:2 Tx focused on standing ex for proprioception, strengthening, and AROM  Gait training 2x150' with RW and S, cues for increased flexion as able.  Standing in // bars:  Standing without UE support for proprioception 2x30 sec, difficulty standing still due to pain.  Therex: heel raises, bil HS curls, marching all 2x10 bil with rest breaks.  Mini squats in // bars with cues to carry this task over into transfers.   Gait in // bars with high knees 4x10'        Therapy Documentation Precautions:  Precautions Precautions: Knee;Fall Precaution Comments: Instructed pt on KI use for amb until able to perform 10 active SLR Ind Required Braces or Orthoses: Knee Immobilizer - Left Knee Immobilizer - Right:  (Discontinued R KI 11/09/12; pt able to perform SLR on R) Knee Immobilizer - Left: Discontinue once  straight leg raise with < 10 degree lag Restrictions Weight Bearing Restrictions: No RLE Weight Bearing: Weight bearing as tolerated LLE Weight Bearing: Weight bearing as tolerated Pain: 4/10 after walk, rest and modified tx prn. Ice applied post tx.   See FIM for current functional status  Therapy/Group: Individual Therapy  Clydene Laming, PT, DPT  11/11/2012, 9:25 AM

## 2012-11-11 NOTE — Progress Notes (Signed)
Patient ID: Leonard [name], male   DOB: [birth year], 57 y.o.   MRN: [id number] 57 y.o. male with history of HTN, DM, bilateral knee pain due to OA with significant varus deformity and failure of conservative therapy. Patient elected to undergo B-TKR on 11/01/12 by Dr. Lequita Halt. Post op with hypotension requiring IVF and complaints of numbness RLE. Epidural cath removed 05/09. He was noted to have presyncopal episode with therapy as well as problems weight bearing through RLE due to numbness.   Subjective/Complaints:  Left leg still more tender but progressing  Review of Systems  Musculoskeletal: Positive for joint pain.  All other systems reviewed and are negative.   Objective: Vital Signs: Blood pressure 148/90, pulse 112, temperature 98.1 F (36.7 C), temperature source Oral, resp. rate 20, height 6' (1.829 m), weight 114.397 kg (252 lb 3.2 oz), SpO2 94.00%. No results found. Results for orders placed during the hospital encounter of 11/06/12 (from the past 72 hour(s))  GLUCOSE, CAPILLARY     Status: Abnormal   Collection Time    11/08/12 11:23 AM      Result Value Range   Glucose-Capillary 174 (*) 70 - 99 mg/dL  GLUCOSE, CAPILLARY     Status: Abnormal   Collection Time    11/08/12  4:44 PM      Result Value Range   Glucose-Capillary 187 (*) 70 - 99 mg/dL  GLUCOSE, CAPILLARY     Status: Abnormal   Collection Time    11/08/12  8:53 PM      Result Value Range   Glucose-Capillary 152 (*) 70 - 99 mg/dL  PROTIME-INR     Status: Abnormal   Collection Time    11/09/12  5:44 AM      Result Value Range   Prothrombin Time 19.5 (*) 11.6 - 15.2 seconds   INR 1.71 (*) 0.00 - 1.49  GLUCOSE, CAPILLARY     Status: Abnormal   Collection Time    11/09/12  7:40 AM      Result Value Range   Glucose-Capillary 195 (*) 70 - 99 mg/dL  CBC     Status: Abnormal   Collection Time    11/09/12  9:00 AM      Result Value Range   WBC 10.0  4.0 - 10.5 K/uL   RBC 3.03 (*) 4.22 - 5.81 MIL/uL    Hemoglobin 8.8 (*) 13.0 - 17.0 g/dL   HCT 14.7 (*) 82.9 - 56.2 %   MCV 84.5  78.0 - 100.0 fL   MCH 29.0  26.0 - 34.0 pg   MCHC 34.4  30.0 - 36.0 g/dL   RDW 13.0  86.5 - 78.4 %   Platelets 359  150 - 400 K/uL  BASIC METABOLIC PANEL     Status: Abnormal   Collection Time    11/09/12  9:00 AM      Result Value Range   Sodium 135  135 - 145 mEq/L   Potassium 4.2  3.5 - 5.1 mEq/L   Chloride 99  96 - 112 mEq/L   CO2 25  19 - 32 mEq/L   Glucose, Bld 146 (*) 70 - 99 mg/dL   BUN 22  6 - 23 mg/dL   Creatinine, Ser 6.96  0.50 - 1.35 mg/dL   Calcium 9.0  8.4 - 29.5 mg/dL   GFR calc non Af Amer 80 (*) >90 mL/min   GFR calc Af Amer >90  >90 mL/min   Comment:  The eGFR has been calculated     using the CKD EPI equation.     This calculation has not been     validated in all clinical     situations.     eGFR's persistently     <90 mL/min signify     possible Chronic Kidney Disease.  GLUCOSE, CAPILLARY     Status: Abnormal   Collection Time    11/09/12 12:02 PM      Result Value Range   Glucose-Capillary 133 (*) 70 - 99 mg/dL  GLUCOSE, CAPILLARY     Status: Abnormal   Collection Time    11/09/12  4:59 PM      Result Value Range   Glucose-Capillary 196 (*) 70 - 99 mg/dL  GLUCOSE, CAPILLARY     Status: Abnormal   Collection Time    11/09/12  8:20 PM      Result Value Range   Glucose-Capillary 142 (*) 70 - 99 mg/dL   Comment 1 Notify RN    PROTIME-INR     Status: Abnormal   Collection Time    11/10/12  5:18 AM      Result Value Range   Prothrombin Time 20.7 (*) 11.6 - 15.2 seconds   INR 1.85 (*) 0.00 - 1.49  GLUCOSE, CAPILLARY     Status: Abnormal   Collection Time    11/10/12  7:17 AM      Result Value Range   Glucose-Capillary 203 (*) 70 - 99 mg/dL   Comment 1 Notify RN    GLUCOSE, CAPILLARY     Status: Abnormal   Collection Time    11/10/12 11:30 AM      Result Value Range   Glucose-Capillary 130 (*) 70 - 99 mg/dL   Comment 1 Notify RN    GLUCOSE, CAPILLARY      Status: Abnormal   Collection Time    11/10/12  5:01 PM      Result Value Range   Glucose-Capillary 261 (*) 70 - 99 mg/dL   Comment 1 Notify RN    GLUCOSE, CAPILLARY     Status: Abnormal   Collection Time    11/10/12  9:26 PM      Result Value Range   Glucose-Capillary 142 (*) 70 - 99 mg/dL   Comment 1 Notify RN    PROTIME-INR     Status: Abnormal   Collection Time    11/11/12  5:50 AM      Result Value Range   Prothrombin Time 22.9 (*) 11.6 - 15.2 seconds   INR 2.13 (*) 0.00 - 1.49  GLUCOSE, CAPILLARY     Status: Abnormal   Collection Time    11/11/12  7:21 AM      Result Value Range   Glucose-Capillary 194 (*) 70 - 99 mg/dL   Comment 1 Notify RN      HEENT: normal Lungs clear Heart regular rate and rhythm Abdomen positive bowel sounds soft nontender palpation Extremities mild to moderate knee effusion, mild erythema around incision site at knees. Decreased range of motion at the knees. More bruising and swelling seen at left knee.  Skin: Subcuticular sutures. No bleeding or drainage Motor strength is 5/5 in bilateral deltoid, biceps, triceps, grip Bilateral hip flexors 3 minus knee extensors 3 minus ankle dose flexors plantar flexor 5/5  Assessment/Plan: 1. Functional deficits secondary to bilateral TKR which require 3+ hours per day of interdisciplinary therapy in a comprehensive inpatient rehab setting. Physiatrist is providing close team supervision  and 24 hour management of active medical problems listed below. Physiatrist and rehab team continue to assess barriers to discharge/monitor patient progress toward functional and medical goals. FIM: FIM - Bathing Bathing Steps Patient Completed: Chest;Right Arm;Left Arm;Abdomen;Front perineal area;Left lower leg (including foot);Right lower leg (including foot);Left upper leg;Right upper leg;Buttocks Bathing: 5: Supervision: Safety issues/verbal cues  FIM - Upper Body Dressing/Undressing Upper body dressing/undressing  steps patient completed: Thread/unthread right sleeve of pullover shirt/dresss;Thread/unthread left sleeve of pullover shirt/dress;Put head through opening of pull over shirt/dress;Pull shirt over trunk Upper body dressing/undressing: 5: Supervision: Safety issues/verbal cues FIM - Lower Body Dressing/Undressing Lower body dressing/undressing steps patient completed: Thread/unthread right pants leg;Thread/unthread left pants leg;Pull pants up/down;Don/Doff right sock;Don/Doff left sock Lower body dressing/undressing: 5: Supervision: Safety issues/verbal cues  FIM - Toileting Toileting steps completed by patient: Adjust clothing prior to toileting;Performs perineal hygiene;Adjust clothing after toileting Toileting Assistive Devices: Grab bar or rail for support Toileting: 5: Supervision: Safety issues/verbal cues  FIM - Diplomatic Services operational officer Devices: Elevated toilet seat Toilet Transfers: 5-To toilet/BSC: Supervision (verbal cues/safety issues)  FIM - Banker Devices: Environmental consultant;Arm rests Bed/Chair Transfer: 5: Chair or W/C > Bed: Supervision (verbal cues/safety issues);5: Bed > Chair or W/C: Supervision (verbal cues/safety issues)  FIM - Locomotion: Wheelchair Distance: 150 Locomotion: Wheelchair: 0: Activity did not occur FIM - Locomotion: Ambulation Locomotion: Ambulation Assistive Devices: Designer, industrial/product Ambulation/Gait Assistance: 5: Supervision Locomotion: Ambulation: 5: Travels 150 ft or more with supervision/safety issues  Comprehension Comprehension Mode: Auditory Comprehension: 7-Follows complex conversation/direction: With no assist  Expression Expression Mode: Verbal Expression: 7-Expresses complex ideas: With no assist  Social Interaction Social Interaction: 7-Interacts appropriately with others - No medications needed.  Problem Solving Problem Solving: 7-Solves complex problems: Recognizes &  self-corrects  Memory Memory: 7-Complete Independence: No helper  Medical Problem List and Plan:  1. DVT Prophylaxis/Anticoagulation: Pharmaceutical: Coumadin sub therapeutic cont Lovenox  2. Pain Management: Continue oxycodone as needed. Complaining of muscle aches. Will schedule robaxin.  3. Mood: Seems to be motivated. No signs of distress. Will monitor and have LCSW follow up for evaluation.  4. Neuropsych: This patient is capable of making decisions on his own behalf.  5. DM type 2: Will monitor with AC/HS cbg checks. Continue tradenta and metformin. Use SSI for elevated BS.Uncontrolled will increase Glucotrol  Anticipate BS to improve with increase in activity.  6. HTN: Monitor BS on bid basis. Will check orthostatic BP/Pulse. Set parameters for Norvasc, HCTZ, and Lotensin.  -anemia component to symptoms, encourage fluids as well.  7. ABLA: added iron supplement. Recheck CBC 05/19. Check stool for occult blood   -encouraged pt that his hgb should plateau then slowly increase given the amount of blood loss from two TKA's, coumadin, etc 8. Leucocytosis: Likely reactive.Urine clean..  9. Constipation: Will schedule miralax. Prn suppository 10. Hyponatremia resolved after IV fluids  LOS (Days) 5 A FACE TO FACE EVALUATION WAS PERFORMED  Nykayla Marcelli T 11/11/2012, 8:38 AM

## 2012-11-11 NOTE — Progress Notes (Addendum)
Occupational Therapy Session Note  Patient Details  Name: Leonard [name] MRN: [id number] Date of Birth: 1956-06-24  Today's Date: 11/11/2012 Time: 0730-0830 Time Calculation (min): 60 min  Short Term Goals:  Week 1: OT Short Term Goal 1 (Week 1): Pt will complete LB dressing and bathing tasks with min assist using AE/DME prn  OT Short Term Goal 2 (Week 1): Pt will tolerate standing at sink for 3 min during grooming task.  OT Short Term Goal 3 (Week 1): Pt will tolerate standing in shower with min-steadying assist to wash 4/10 body parts  OT Short Term Goal 4 (Week 1): Pt will complete toilet transfer with supervision assist    Therapy Documentation Precautions:  Precautions Precautions: Knee;Fall Precaution Comments: Instructed pt on KI use for amb until able to perform 10 active SLR Ind Required Braces or Orthoses: Knee Immobilizer - Left Knee Immobilizer - Right:  (Discontinued R KI 11/09/12; pt able to perform SLR on R) Knee Immobilizer - Left: Discontinue once straight leg raise with < 10 degree lag Restrictions Weight Bearing Restrictions: No RLE Weight Bearing: Weight bearing as tolerated LLE Weight Bearing: Weight bearing as tolerated  Pain: Pain Assessment Pain Assessment: 0-10 Pain Score:   3 Pain Type: Surgical pain Pain Location: Knee Pain Orientation: Right;Left Pain Descriptors: Aching Pain Onset: Gradual Pain Intervention(s): Medication (See eMAR)  ADL: AM SESSION:  on 5/17  completed Batheing with wife last night and will complete again later today.   ONLY will do BATHING/DRessing with male or his wife.    OT and PT will assess today safety as far as whether wife might be able to safely assist patient with shower.  This am patient exhibited safe ambulation w/c level for clothing retrieval and other functional mobility.  Also patient was able to don L Knee Immobilizer.   Very independent  With good reasoning and problem solving skills.     See FIM for  current functional status  Therapy/Group: Individual Therapy  [facility] 11/11/2012, 9:31 AM

## 2012-11-12 ENCOUNTER — Inpatient Hospital Stay (HOSPITAL_COMMUNITY): Payer: BC Managed Care – PPO | Admitting: *Deleted

## 2012-11-12 LAB — GLUCOSE, CAPILLARY
Glucose-Capillary: 175 mg/dL — ABNORMAL HIGH (ref 70–99)
Glucose-Capillary: 124 mg/dL — ABNORMAL HIGH (ref 70–99)
Glucose-Capillary: 136 mg/dL — ABNORMAL HIGH (ref 70–99)

## 2012-11-12 LAB — PROTIME-INR: INR: 2.37 — ABNORMAL HIGH (ref 0.00–1.49)

## 2012-11-12 MED ORDER — WARFARIN SODIUM 10 MG PO TABS
10.0000 mg | ORAL_TABLET | Freq: Every day | ORAL | Status: DC
  Administered 2012-11-12 – 2012-11-13 (×2): 10 mg via ORAL
  Filled 2012-11-12 (×2): qty 1

## 2012-11-12 NOTE — Progress Notes (Addendum)
Patient ID: Leonard [name], male   DOB: 03-07-1956, 57 y.o.   MRN: [id number] 57 y.o. male with history of HTN, DM, bilateral knee pain due to OA with significant varus deformity and failure of conservative therapy. Patient elected to undergo B-TKR on 11/01/12 by Dr. Lequita Halt. Post op with hypotension requiring IVF and complaints of numbness RLE. Epidural cath removed 05/09. He was noted to have presyncopal episode with therapy as well as problems weight bearing through RLE due to numbness.   Subjective/Complaints:  Left knee moved better yesterday. Still has a good amount of pain with therapies.   Review of Systems  Musculoskeletal: Positive for joint pain.  All other systems reviewed and are negative.   Objective: Vital Signs: Blood pressure 113/71, pulse 120, temperature 98.2 F (36.8 C), temperature source Oral, resp. rate 16, height 6' (1.829 m), weight 114.397 kg (252 lb 3.2 oz), SpO2 98.00%. No results found. Results for orders placed during the hospital encounter of 11/06/12 (from the past 72 hour(s))  CBC     Status: Abnormal   Collection Time    11/09/12  9:00 AM      Result Value Range   WBC 10.0  4.0 - 10.5 K/uL   RBC 3.03 (*) 4.22 - 5.81 MIL/uL   Hemoglobin 8.8 (*) 13.0 - 17.0 g/dL   HCT 42.5 (*) 95.6 - 38.7 %   MCV 84.5  78.0 - 100.0 fL   MCH 29.0  26.0 - 34.0 pg   MCHC 34.4  30.0 - 36.0 g/dL   RDW 56.4  33.2 - 95.1 %   Platelets 359  150 - 400 K/uL  BASIC METABOLIC PANEL     Status: Abnormal   Collection Time    11/09/12  9:00 AM      Result Value Range   Sodium 135  135 - 145 mEq/L   Potassium 4.2  3.5 - 5.1 mEq/L   Chloride 99  96 - 112 mEq/L   CO2 25  19 - 32 mEq/L   Glucose, Bld 146 (*) 70 - 99 mg/dL   BUN 22  6 - 23 mg/dL   Creatinine, Ser 8.84  0.50 - 1.35 mg/dL   Calcium 9.0  8.4 - 16.6 mg/dL   GFR calc non Af Amer 80 (*) >90 mL/min   GFR calc Af Amer >90  >90 mL/min   Comment:            The eGFR has been calculated     using the CKD EPI  equation.     This calculation has not been     validated in all clinical     situations.     eGFR's persistently     <90 mL/min signify     possible Chronic Kidney Disease.  GLUCOSE, CAPILLARY     Status: Abnormal   Collection Time    11/09/12 12:02 PM      Result Value Range   Glucose-Capillary 133 (*) 70 - 99 mg/dL  GLUCOSE, CAPILLARY     Status: Abnormal   Collection Time    11/09/12  4:59 PM      Result Value Range   Glucose-Capillary 196 (*) 70 - 99 mg/dL  GLUCOSE, CAPILLARY     Status: Abnormal   Collection Time    11/09/12  8:20 PM      Result Value Range   Glucose-Capillary 142 (*) 70 - 99 mg/dL   Comment 1 Notify RN    [employer]  Status: Abnormal   Collection Time    11/10/12  5:18 AM      Result Value Range   Prothrombin Time 20.7 (*) 11.6 - 15.2 seconds   INR 1.85 (*) 0.00 - 1.49  GLUCOSE, CAPILLARY     Status: Abnormal   Collection Time    11/10/12  7:17 AM      Result Value Range   Glucose-Capillary 203 (*) 70 - 99 mg/dL   Comment 1 Notify RN    GLUCOSE, CAPILLARY     Status: Abnormal   Collection Time    11/10/12 11:30 AM      Result Value Range   Glucose-Capillary 130 (*) 70 - 99 mg/dL   Comment 1 Notify RN    GLUCOSE, CAPILLARY     Status: Abnormal   Collection Time    11/10/12  5:01 PM      Result Value Range   Glucose-Capillary 261 (*) 70 - 99 mg/dL   Comment 1 Notify RN    GLUCOSE, CAPILLARY     Status: Abnormal   Collection Time    11/10/12  9:26 PM      Result Value Range   Glucose-Capillary 142 (*) 70 - 99 mg/dL   Comment 1 Notify RN    PROTIME-INR     Status: Abnormal   Collection Time    11/11/12  5:50 AM      Result Value Range   Prothrombin Time 22.9 (*) 11.6 - 15.2 seconds   INR 2.13 (*) 0.00 - 1.49  GLUCOSE, CAPILLARY     Status: Abnormal   Collection Time    11/11/12  7:21 AM      Result Value Range   Glucose-Capillary 194 (*) 70 - 99 mg/dL   Comment 1 Notify RN    OCCULT BLOOD X 1 CARD TO LAB, STOOL     Status: None    Collection Time    11/11/12  9:19 AM      Result Value Range   Fecal Occult Bld NEGATIVE  NEGATIVE  GLUCOSE, CAPILLARY     Status: Abnormal   Collection Time    11/11/12 11:25 AM      Result Value Range   Glucose-Capillary 121 (*) 70 - 99 mg/dL   Comment 1 Notify RN    GLUCOSE, CAPILLARY     Status: Abnormal   Collection Time    11/11/12  4:38 PM      Result Value Range   Glucose-Capillary 165 (*) 70 - 99 mg/dL   Comment 1 Notify RN    GLUCOSE, CAPILLARY     Status: Abnormal   Collection Time    11/11/12  9:21 PM      Result Value Range   Glucose-Capillary 182 (*) 70 - 99 mg/dL  PROTIME-INR     Status: Abnormal   Collection Time    11/12/12  5:10 AM      Result Value Range   Prothrombin Time 24.8 (*) 11.6 - 15.2 seconds   INR 2.37 (*) 0.00 - 1.49    HEENT: normal Lungs clear Heart regular rate and rhythm Abdomen positive bowel sounds soft nontender palpation Extremities mild to moderate knee effusion, mild erythema around incision site at knees. Decreased range of motion at the knees. More bruising and swelling seen at left knee.  Skin: Subcuticular sutures. No bleeding or drainage Motor strength is 5/5 in bilateral deltoid, biceps, triceps, grip Bilateral hip flexors 3 minus knee extensors 3 minus ankle dose flexors plantar  flexor 5/5  Assessment/Plan: 1. Functional deficits secondary to bilateral TKR which require 3+ hours per day of interdisciplinary therapy in a comprehensive inpatient rehab setting. Physiatrist is providing close team supervision and 24 hour management of active medical problems listed below. Physiatrist and rehab team continue to assess barriers to discharge/monitor patient progress toward functional and medical goals. FIM: FIM - Bathing Bathing Steps Patient Completed:  (states he completed this with his wife last night & later ) Bathing: 0: Activity did not occur  FIM - Upper Body Dressing/Undressing Upper body dressing/undressing steps  patient completed: Thread/unthread right sleeve of pullover shirt/dresss;Thread/unthread left sleeve of pullover shirt/dress;Put head through opening of pull over shirt/dress;Pull shirt over trunk Upper body dressing/undressing: 5: Supervision: Safety issues/verbal cues FIM - Lower Body Dressing/Undressing Lower body dressing/undressing steps patient completed: Thread/unthread right pants leg;Thread/unthread left pants leg;Pull pants up/down;Don/Doff right sock;Don/Doff left sock Lower body dressing/undressing: 0: Activity did not occur  FIM - Toileting Toileting steps completed by patient: Adjust clothing prior to toileting;Performs perineal hygiene;Adjust clothing after toileting Toileting Assistive Devices: Grab bar or rail for support Toileting: 0: Activity did not occur  FIM - Diplomatic Services operational officer Devices: Elevated toilet seat Toilet Transfers: 5-To toilet/BSC: Supervision (verbal cues/safety issues)  FIM - Banker Devices: Environmental consultant;Arm rests;Orthosis Bed/Chair Transfer: 5: Bed > Chair or W/C: Supervision (verbal cues/safety issues);5: Chair or W/C > Bed: Supervision (verbal cues/safety issues)  FIM - Locomotion: Wheelchair Distance: 150 Locomotion: Wheelchair: 0: Activity did not occur FIM - Locomotion: Ambulation Locomotion: Ambulation Assistive Devices: Designer, industrial/product Ambulation/Gait Assistance: 5: Supervision Locomotion: Ambulation: 5: Travels 150 ft or more with supervision/safety issues  Comprehension Comprehension Mode: Auditory Comprehension: 7-Follows complex conversation/direction: With no assist  Expression Expression Mode: Verbal Expression: 7-Expresses complex ideas: With no assist  Social Interaction Social Interaction: 7-Interacts appropriately with others - No medications needed.  Problem Solving Problem Solving: 7-Solves complex problems: Recognizes & self-corrects  Memory Memory: 7-Complete  Independence: No helper  Medical Problem List and Plan:  1. DVT Prophylaxis/Anticoagulation: Pharmaceutical: Coumadin sub therapeutic cont Lovenox  2. Pain Management: Continue oxycodone as needed. Complaining of muscle aches. Will schedule robaxin.   -encouraged pre-treating pain prior to therapies. Can schedule oxy IR if needed.  -on oxy CR as well q12 3. Mood: Seems to be motivated. No signs of distress. Will monitor and have LCSW follow up for evaluation.  4. Neuropsych: This patient is capable of making decisions on his own behalf.  5. DM type 2: Will monitor with AC/HS cbg checks. Continue tradenta and metformin. Use SSI for elevated BS.Glucotrol recently increased. Sugars better yesterday.  6. HTN: Monitor BS on bid basis. Will check orthostatic BP/Pulse. Set parameters for Norvasc, HCTZ, and Lotensin.  -anemia component to symptoms, encourage fluids as well.  7. ABLA: added iron supplement. Recheck CBC 05/19. Stool OB-  -encouraged pt that his hgb should plateau then slowly increase given the amount of blood loss from two TKA's, coumadin, etc 8. Leucocytosis: Likely reactive.Urine clean..  9. Constipation: scheduled miralax. Prn suppository 10. Hyponatremia resolved after IV fluids  LOS (Days) 6 A FACE TO FACE EVALUATION WAS PERFORMED  Lyndel Sarate T 11/12/2012, 8:52 AM

## 2012-11-12 NOTE — Progress Notes (Signed)
ANTICOAGULATION CONSULT NOTE - Follow Up Consult  Pharmacy Consult for coumadin Indication: VTE prophylaxis  Allergies  Allergen Reactions  . Ultram (Tramadol)     Moderate disorientation    Patient Measurements: Height: 6' (182.9 cm) Weight: 252 lb 3.2 oz (114.397 kg) IBW/kg (Calculated) : 77.6 Heparin Dosing Weight:   Vital Signs: Temp: 98.2 F (36.8 C) (05/18 0643) Temp src: Oral (05/18 0643) BP: 113/71 mmHg (05/18 0645) Pulse Rate: 120 (05/18 0645)  Labs:  Recent Labs  11/10/12 0518 11/11/12 0550 11/12/12 0510  LABPROT 20.7* 22.9* 24.8*  INR 1.85* 2.13* 2.37*    Estimated Creatinine Clearance: 105.6 ml/min (by C-G formula based on Cr of 1.02).   Medications:  Scheduled:  . allopurinol  100 mg Oral QAC breakfast  . amLODipine  10 mg Oral QHS  . atorvastatin  20 mg Oral QHS  . feeding supplement  120 mL Oral QID  . glipiZIDE  10 mg Oral QAC breakfast  . hydrochlorothiazide  25 mg Oral QAC breakfast  . insulin aspart  0-20 Units Subcutaneous TID WC  . insulin aspart  0-5 Units Subcutaneous QHS  . iron polysaccharides  150 mg Oral BID AC  . linagliptin  5 mg Oral QAC breakfast   And  . metFORMIN  500 mg Oral BID WC  . lisinopril  40 mg Oral QHS  . methocarbamol  500-1,000 mg Oral QID  . OxyCODONE  10 mg Oral Q12H  . polyethylene glycol  17 g Oral BID  . potassium citrate  10 mEq Oral BID  . sodium chloride  2 spray Each Nare TID AC & HS  . tamsulosin  0.4 mg Oral QHS  . Warfarin - Pharmacist Dosing Inpatient   Does not apply q1800   Infusions:    Assessment: 57 yo male s/p TKA is currently on subtherapeutic coumadin.  INR is at goal. Will try a standing dose.  Goal of Therapy:  INR 2-3 Monitor platelets by anticoagulation protocol: Yes   Plan:   Coumadin 10mg  PO qday Daily INR for now

## 2012-11-12 NOTE — Progress Notes (Signed)
RT Note: Pt has home CPAP at bedside and plans to wear again tonight. Pt states he does not need any further assistance. RT will continue to monitor

## 2012-11-12 NOTE — Progress Notes (Signed)
Physical Therapy Session Note  Patient Details  Name: Leonard [name] MRN: [id number] Date of Birth: [birth year]  Today's Date: 11/12/2012 Time: 1000-1045 Time Calculation (min): 45 min   Skilled Therapeutic Interventions/Progress Updates:  Gait training with RW 2x 140 feet with CGA and cues to increase knee flexion an increase step length. Exercises to increase ROM ans strength in B Knees included :in sitting SAQ 2x 15, Heel slides 2x15, hamstring curls 2x 15, in supine:SLR 2x 12 (R side (I), L side with minA), theraband exercises to increase strength in B LE, knee extension 2x15, knee flexion in a closed chain exercises 2x15. Transfer training from different height chairs and recliner.    Therapy Documentation Precautions:  Precautions Precautions: Knee;Fall Precaution Comments: Instructed pt on KI use for amb until able to perform 10 active SLR Ind Required Braces or Orthoses: Knee Immobilizer - Left Knee Immobilizer - Right:  (Discontinued R KI 11/09/12; pt able to perform SLR on R) Knee Immobilizer - Left: Discontinue once straight leg raise with < 10 degree lag Restrictions Weight Bearing Restrictions: No RLE Weight Bearing: Weight bearing as tolerated LLE Weight Bearing: Weight bearing as tolerated Pain: Pain Assessment Pain Score:   2 Pain Type: Surgical pain Pain Location: Knee Pain Orientation: Right;Left Pain Intervention(s): Medication (See eMAR)   See FIM for current functional status  Therapy/Group: Individual Therapy  Dorna Mai 11/12/2012, 11:59 AM

## 2012-11-13 ENCOUNTER — Inpatient Hospital Stay (HOSPITAL_COMMUNITY): Payer: BC Managed Care – PPO | Admitting: *Deleted

## 2012-11-13 ENCOUNTER — Inpatient Hospital Stay (HOSPITAL_COMMUNITY): Payer: BC Managed Care – PPO

## 2012-11-13 ENCOUNTER — Inpatient Hospital Stay (HOSPITAL_COMMUNITY): Payer: BC Managed Care – PPO | Admitting: Occupational Therapy

## 2012-11-13 DIAGNOSIS — M1710 Unilateral primary osteoarthritis, unspecified knee: Secondary | ICD-10-CM

## 2012-11-13 DIAGNOSIS — Z96659 Presence of unspecified artificial knee joint: Secondary | ICD-10-CM

## 2012-11-13 DIAGNOSIS — D62 Acute posthemorrhagic anemia: Secondary | ICD-10-CM

## 2012-11-13 LAB — CBC
HCT: 26.3 % — ABNORMAL LOW (ref 39.0–52.0)
Hemoglobin: 9 g/dL — ABNORMAL LOW (ref 13.0–17.0)
MCH: 29.3 pg (ref 26.0–34.0)
MCHC: 34.2 g/dL (ref 30.0–36.0)
MCV: 85.7 fL (ref 78.0–100.0)
RBC: 3.07 MIL/uL — ABNORMAL LOW (ref 4.22–5.81)

## 2012-11-13 LAB — GLUCOSE, CAPILLARY
Glucose-Capillary: 177 mg/dL — ABNORMAL HIGH (ref 70–99)
Glucose-Capillary: 118 mg/dL — ABNORMAL HIGH (ref 70–99)
Glucose-Capillary: 156 mg/dL — ABNORMAL HIGH (ref 70–99)

## 2012-11-13 LAB — COMPREHENSIVE METABOLIC PANEL
BUN: 28 mg/dL — ABNORMAL HIGH (ref 6–23)
CO2: 26 mEq/L (ref 19–32)
Calcium: 9.2 mg/dL (ref 8.4–10.5)
GFR calc Af Amer: >90 mL/min (ref >90)
GFR calc non Af Amer: >90 mL/min (ref >90)
Glucose, Bld: 197 mg/dL — ABNORMAL HIGH (ref 70–99)
Total Protein: 7 g/dL (ref 6.0–8.3)

## 2012-11-13 MED ORDER — WARFARIN SODIUM 5 MG PO TABS: 10.0000 mg | ORAL_TABLET | Freq: Every day | ORAL | Status: DC

## 2012-11-13 MED ORDER — GLIPIZIDE 10 MG PO TABS: 10.0000 mg | ORAL_TABLET | Freq: Every day | ORAL | Status: DC

## 2012-11-13 MED ORDER — POLYETHYLENE GLYCOL 3350 17 G PO PACK: 17.0000 g | PACK | Freq: Two times a day (BID) | ORAL | Status: DC

## 2012-11-13 MED ORDER — METHOCARBAMOL 500 MG PO TABS: 500.0000 mg | ORAL_TABLET | Freq: Four times a day (QID) | ORAL | Status: DC | PRN

## 2012-11-13 MED ORDER — OXYCODONE HCL ER 10 MG PO T12A: 10.0000 mg | EXTENDED_RELEASE_TABLET | Freq: Two times a day (BID) | ORAL | Status: DC

## 2012-11-13 MED ORDER — POLYSACCHARIDE IRON COMPLEX 150 MG PO CAPS: 150.0000 mg | ORAL_CAPSULE | Freq: Two times a day (BID) | ORAL | Status: DC

## 2012-11-13 MED ORDER — ACETAMINOPHEN 325 MG PO TABS: 325.0000 mg | ORAL_TABLET | ORAL | Status: DC | PRN

## 2012-11-13 MED ORDER — OXYCODONE HCL 5 MG PO TABS: 5.0000 mg | ORAL_TABLET | Freq: Four times a day (QID) | ORAL | Status: DC | PRN

## 2012-11-13 NOTE — Discharge Summary (Signed)
Physician Discharge Summary  Patient ID: Leonard [name] MRN: [id number] DOB/AGE: December 13, 1955 57 y.o.  Admit date: 11/06/2012 Discharge date: 11/13/2012  Discharge Diagnoses:  Principal Problem:   Arthritis of both knees--B TKR Active Problems:   DIABETES MELLITUS, TYPE II   OBSTRUCTIVE SLEEP APNEA   HYPERTENSION   Acute blood loss anemia   Orthostasis   Discharged Condition: Good  Significant Diagnostic Studies: Dg Chest 2 View  10/20/2012   *RADIOLOGY REPORT*  Clinical Data: Hypertension  CHEST - 2 VIEW  Comparison: June 14, 2007.  Findings: Cardiomediastinal silhouette appears normal.  No acute pulmonary disease is noted.  Bony thorax is intact.  IMPRESSION: No acute cardiopulmonary abnormality seen.   Original Report Authenticated By: Lupita Raider.,  M.D.    Labs:  Basic Metabolic Panel:  Recent Labs Lab 11/09/12 0900 11/13/12 0545  NA 135 134*  K 4.2 4.6  CL 99 97  CO2 25 26  GLUCOSE 146* 197*  BUN 22 28*  CREATININE 1.02 0.97  CALCIUM 9.0 9.2    CBC:  Recent Labs Lab 11/09/12 0900 11/13/12 0545  WBC 10.0 11.6*  HGB 8.8* 9.0*  HCT 25.6* 26.3*  MCV 84.5 85.7  PLT 359 542*    CBG:  Recent Labs Lab 11/12/12 1126 11/12/12 1632 11/12/12 2027 11/13/12 0718 11/13/12 1121  GLUCAP 124* 123* 136* 177* 118*    Brief HPI:   Leonard [name] is a 57 y.o. male with history of HTN, DM, bilateral knee pain due to OA with significant varus deformity and failure of conservative therapy. Patient elected to undergo B-TKR on 11/01/12 by Dr. Lequita Halt.  He was noted to have presyncopal episode with therapy due to orthostasis as well as problems weight bearing through RLE due to numbness as well as flexed posture. Therapies ongoing and recommended CIR for follow up therapies.    Hospital Course: Leonard [name] was admitted to rehab 11/06/2012 for inpatient therapies to consist of PT, ST and OT at least three hours five days a week. Past admission  physiatrist, therapy team and rehab RN have worked together to provide customized collaborative inpatient rehab. Blood pressures were monitored on bid basis and BP meds were adjusted due to orthostatic symptoms.  Orthostatic vitals were was monitored and medications resumed as tolerance of activity improved. Hyponateremia has improved. Diabetes was monitored with AC/HS cbg checks. As blood sugars were noted to be elevated,  glucotrol was increased to 10 mg daily. He is to follow up with Dr. Drue Novel for further adjustment in diabetic regimen.   He was started on iron supplement for ABLA with routine monitoring of H/H. He is to continue on coumadin for DVT prophylaxis. INR is therapeutic at 2.21 and he is discharged on coumadin 10 mg daily. Patient's wounds have been monitored daily and have been healing well without signs or symptoms of infection. Hematomas/edema of bilateral knees are resolving.  He was started on OxyContin 10 mg bid for pain management and was premedicated prior to therapy sessions to help with tolerance of therapies. He was educated on taper of narcotics past discharge.   Rehab course: During patient's stay in rehab weekly team conferences were held to monitor patient's progress, set goals and discuss barriers to discharge. Occupational therapy has worked on ADL tasks and endurance. He is independent for all bathing and dressing.   He has had  improved activity tolerance, balance, postural control as well as ability to compensate for deficits. Physical therapy has worked on mobility as  well as ROM of bilateral knees. He is independent for tranfers and is able to ambulate up to 250 feet with rolling walker. He is able to navigate 12 steps independently with increased time. Knee flexion continues to be limited due to pain inhibition. He is instructed to continue his HEP as well as CPM bilateral knees to focus on improving ROM. RLE AROM at 79 degrees flexion lacking 6 degrees extension. LLE AROM 67  degrees flexion lacking 11 degrees extension.  [facility] to provide PT, OT and RN past discharge.   Disposition: Home  Diet: Diabetic.   Special Instructions: 1. Use CPM for at least 4 hours on each leg daily.   2. Do your home exercise program twice a day. Get up and walk every hour when at home.  3. Check blood sugars twice a day and follow up with Dr. Drue Novel for adjustment of medication.  4. Ice knees 3-4 times daily. 5. Coumadin to be managed by [facility]--Dr. Aluisio's protocol. 6. CPAP at bedtime.        Future Appointments Provider Department Dept Phone   11/23/2012 11:00 AM Wanda Plump, MD [facility]  [place] [phone]   01/17/2013 9:00 AM Wanda Plump, MD [facility]  [place] [phone]   01/19/2013 1:45 PM Rollene Rotunda, MD [facility] [place]) [phone]       Medication List    STOP taking these medications       traMADol 50 MG tablet  Commonly known as:  ULTRAM      TAKE these medications       acetaminophen 325 MG tablet  Commonly known as:  TYLENOL  Take 1-2 tablets (325-650 mg total) by mouth every 4 (four) hours as needed.     allopurinol 100 MG tablet  Commonly known as:  ZYLOPRIM  Take 100 mg by mouth daily before breakfast.     amLODipine-benazepril 10-40 MG per capsule  Commonly known as:  LOTREL  Take 1 capsule by mouth at bedtime.     atorvastatin 20 MG tablet  Commonly known as:  LIPITOR  Take 20 mg by mouth at bedtime.     clonazePAM 0.5 MG tablet  Commonly known as:  KLONOPIN  Take 0.25 mg by mouth 2 (two) times daily as needed for anxiety. TAKES 1/2     fish oil-omega-3 fatty acids 1000 MG capsule  Take 3 g by mouth 2 (two) times daily.     glipiZIDE 10 MG tablet  Commonly known as:  GLUCOTROL  Take 1 tablet (10 mg total) by mouth daily.     hydrochlorothiazide 25 MG tablet  Commonly known as:  HYDRODIURIL  Take 25 mg by mouth daily before breakfast.      iron polysaccharides 150 MG capsule  Commonly known as:  NIFEREX  Take 1 capsule (150 mg total) by mouth 2 (two) times daily before lunch and supper.     Melatonin 5 MG Tabs  Take 1 tablet by mouth at bedtime as needed (sleep).     methocarbamol 500 MG tablet  Commonly known as:  ROBAXIN  Take 1 tablet (500 mg total) by mouth every 6 (six) hours as needed (for spasms).     multivitamin with minerals Tabs  Take 1 tablet by mouth daily.     oxyCODONE 5 MG immediate release tablet Rx# 100 pills  Commonly known as:  Oxy IR/ROXICODONE  Take 1-2 tablets (5-10 mg total) by mouth every 6 (six) hours  as needed (for severe pain).     OxyCODONE 10 mg  Rx # 21 pills  Commonly known as:  OXYCONTIN  Take 1 tablet (10 mg total) by mouth every 12 (twelve) hours. For one week. Then decrease to one pill daily till gone.     polyethylene glycol packet  Commonly known as:  MIRALAX / GLYCOLAX  Take 17 g by mouth 2 (two) times daily.     potassium citrate 10 MEQ (1080 MG) SR tablet  Commonly known as:  UROCIT-K  Take 10 mEq by mouth 2 (two) times daily.     sitaGLIPtan-metformin 50-500 MG per tablet  Commonly known as:  JANUMET  Take 1 tablet by mouth 2 (two) times daily with a meal.     tamsulosin 0.4 MG Caps  Commonly known as:  FLOMAX  Take 0.4 mg by mouth at bedtime.     warfarin 5 MG tablet  Commonly known as:  COUMADIN  Take 2 tablets (10 mg total) by mouth daily at 6 PM. To prevent blood clots       Follow-up Information   Call Erick Colace, MD. (As needed)    Contact information:   [address] [place] [address] [phone]       Follow up with [name], MD. Call today. (for follow up appointment in 7-10 days. )    Contact information:   [address] [place] [address] [phone]       Follow up with [name], MD On 11/23/2012. (Post hospital appointment at 11 am. Check CBC/ lytes. )     Contact information:   [address] [facility] [address] [place] [address] [phone]       Signed: Jacquelynn Cree 11/13/2012, 4:22 PM

## 2012-11-13 NOTE — Progress Notes (Signed)
Social Work Patient ID: Leonard [name], male   DOB: 07-21-1955, 57 y.o.   MRN: [id number] Pt requires a lightweight wheelchair to self propel and uses for self care.  He is unable to self propel a standard wheelchair.

## 2012-11-13 NOTE — Progress Notes (Signed)
Social Work Discharge Note Discharge Note  The overall goal for the admission was met for:   Discharge location: Yes-HOME WITH WIFE, ALONE DURING THE DAY  Length of Stay: Yes-7 DAYS  Discharge activity level: Yes-MOD/I LEVEL  Home/community participation: Yes  Services provided included: MD, RD, PT, OT, RN, CM, Pharmacy and SW  Financial Services: Private Insurance: [place] [employer]  Follow-up services arranged: Home Health: [facility], Charity fundraiser, DME: Karl Ito, [facility] and Patient/Family request agency HH: Tem[facility] Donning, DME: [facility]  Comments (or additional information):REQUESTED DISCHARGE EARLIER THAN 5/20.  PT AND WIFE AGREEABLE TO THIS  Patient/Family verbalized understanding of follow-up arrangements: Yes  Individual responsible for coordination of the follow-up plan: SELF & JANET-WIFE  Confirmed correct DME delivered: Lucy Chris 11/13/2012    Lucy Chris

## 2012-11-13 NOTE — Progress Notes (Signed)
Social Work Patient ID: Leonard [name], male   DOB: Oct 29, 1955, 57 y.o.   MRN: [id number] Spoke with wife who wants a rental wheelchair for community use.  She feels he will not be walking far to MD appts, etc.  Will make referral to [facility] and have Delivered to home.  All other concerns addressed and ready for discharge later today.

## 2012-11-13 NOTE — Progress Notes (Signed)
Social Work Patient ID: Margarita Mail, male   DOB: 02/03/1956, 57 y.o.   MRN: [id number] Spoke with pt who adamantly refuses a wheelchair.  Informed him his wife is requesting this. Pt states: " My wife exaggerates and will never put me in a wheelchair." He reports he will discuss with wife and for this worker to cancel the order.  Order cancelled.

## 2012-11-13 NOTE — Progress Notes (Signed)
Occupational Therapy Discharge Summary  Patient Details  Name: Leonard [name] MRN: [id number] Date of Birth: 18-Oct-1955  Today's Date: 11/13/2012 Time: 1304-1400 Time Calculation (min): 56 min  Patient has met 10 of 10 long term goals due to improved activity tolerance, improved balance, postural control and ability to compensate for deficits.  Patient to discharge at overall Modified Independent level.  Patient's care partner is independent to provide the necessary physical and cognitive assistance at discharge.    Reasons goals not met: N/A  Recommendation:  Patient does not required any further skilled OT services at this time as he completes self-care tasks at modified independent level and demonstrates ability to safely and efficiently complete home management tasks.   Equipment: 3-in-1 commode, RW  Reasons for discharge: treatment goals met  Patient/family agrees with progress made and goals achieved: Yes  Skilled Therapeutic Intervention: Therapy session focused on dynamic standing balance and functional endurance. Pt ambulated from room to day room with RW to water plants as he practiced reaching to floor to retrieve water bucket between each plant, focusing on knee flexion. Required rest break before ambulating to therapy gym. Participated in nu-step exercise for 11 min with workload 7. Engaged in dynamic balance activity of retrieving various items from floor and carrying them to designated surface using RW. Participated in meal prep task and pt able to safely complete and retrieve items from low cabinets. Good activity tolerance throughout session as pt required only 3 rest breaks. Pt reported no questions or concerns at end of therapy session.   OT Discharge Precautions/Restrictions  Precautions Precautions: Knee Restrictions RLE Weight Bearing: Weight bearing as tolerated LLE Weight Bearing: Weight bearing as tolerated General   Vital Signs Therapy Vitals Temp:  98 F (36.7 C) Temp src: Oral Pulse Rate: 106 Resp: 18 BP: 133/85 mmHg Patient Position, if appropriate: Sitting Oxygen Therapy SpO2: 96 % O2 Device: None (Room air) Pain Pain Assessment Pain Score:   5 Pain Type: Surgical pain Pain Location: Knee Pain Orientation: Right;Left Pain Intervention(s): Medication (See eMAR) ADL   Vision/Perception  Vision - History Baseline Vision: Wears glasses only for reading Patient Visual Report: No change from baseline  Cognition Overall Cognitive Status: Within Functional Limits for tasks assessed Arousal/Alertness: Awake/alert Orientation Level: Oriented X4 Memory: Appears intact Awareness: Appears intact Problem Solving: Appears intact Safety/Judgment: Appears intact Sensation Sensation Light Touch: Appears Intact Proprioception: Appears Intact Coordination Gross Motor Movements are Fluid and Coordinated: Yes Fine Motor Movements are Fluid and Coordinated: Yes Motor    Mobility     Trunk/Postural Assessment     Balance   Extremity/Trunk Assessment RUE Assessment RUE Assessment: Within Functional Limits LUE Assessment LUE Assessment: Within Functional Limits  See FIM for current functional status  Normand Damron N 11/13/2012, 2:40 PM

## 2012-11-13 NOTE — Progress Notes (Signed)
Patient ID: Leonard [name], male   DOB: 11/24/1955, 57 y.o.   MRN: [id number] 57 y.o. male with history of HTN, DM, bilateral knee pain due to OA with significant varus deformity and failure of conservative therapy. Patient elected to undergo B-TKR on 11/01/12 by Dr. Lequita Halt. Post op with hypotension requiring IVF and complaints of numbness RLE. Epidural cath removed 05/09. He was noted to have presyncopal episode with therapy as well as problems weight bearing through RLE due to numbness.   Subjective/Complaints:  Able to lift both legs off bed  Review of Systems  Musculoskeletal: Positive for joint pain.  All other systems reviewed and are negative.   Objective: Vital Signs: Blood pressure 149/90, pulse 99, temperature 98.2 F (36.8 C), temperature source Oral, resp. rate 19, height 6' (1.829 m), weight 118.4 kg (261 lb 0.4 oz), SpO2 92.00%. No results found. Results for orders placed during the hospital encounter of 11/06/12 (from the past 72 hour(s))  GLUCOSE, CAPILLARY     Status: Abnormal   Collection Time    11/10/12 11:30 AM      Result Value Range   Glucose-Capillary 130 (*) 70 - 99 mg/dL   Comment 1 Notify RN    GLUCOSE, CAPILLARY     Status: Abnormal   Collection Time    11/10/12  5:01 PM      Result Value Range   Glucose-Capillary 261 (*) 70 - 99 mg/dL   Comment 1 Notify RN    GLUCOSE, CAPILLARY     Status: Abnormal   Collection Time    11/10/12  9:26 PM      Result Value Range   Glucose-Capillary 142 (*) 70 - 99 mg/dL   Comment 1 Notify RN    PROTIME-INR     Status: Abnormal   Collection Time    11/11/12  5:50 AM      Result Value Range   Prothrombin Time 22.9 (*) 11.6 - 15.2 seconds   INR 2.13 (*) 0.00 - 1.49  GLUCOSE, CAPILLARY     Status: Abnormal   Collection Time    11/11/12  7:21 AM      Result Value Range   Glucose-Capillary 194 (*) 70 - 99 mg/dL   Comment 1 Notify RN    OCCULT BLOOD X 1 CARD TO LAB, STOOL     Status: None   Collection Time   11/11/12  9:19 AM      Result Value Range   Fecal Occult Bld NEGATIVE  NEGATIVE  GLUCOSE, CAPILLARY     Status: Abnormal   Collection Time    11/11/12 11:25 AM      Result Value Range   Glucose-Capillary 121 (*) 70 - 99 mg/dL   Comment 1 Notify RN    GLUCOSE, CAPILLARY     Status: Abnormal   Collection Time    11/11/12  4:38 PM      Result Value Range   Glucose-Capillary 165 (*) 70 - 99 mg/dL   Comment 1 Notify RN    GLUCOSE, CAPILLARY     Status: Abnormal   Collection Time    11/11/12  9:21 PM      Result Value Range   Glucose-Capillary 182 (*) 70 - 99 mg/dL  PROTIME-INR     Status: Abnormal   Collection Time    11/12/12  5:10 AM      Result Value Range   Prothrombin Time 24.8 (*) 11.6 - 15.2 seconds   INR 2.37 (*) 0.00 -  1.49  GLUCOSE, CAPILLARY     Status: Abnormal   Collection Time    11/12/12  7:23 AM      Result Value Range   Glucose-Capillary 175 (*) 70 - 99 mg/dL   Comment 1 Notify RN    GLUCOSE, CAPILLARY     Status: Abnormal   Collection Time    11/12/12 11:26 AM      Result Value Range   Glucose-Capillary 124 (*) 70 - 99 mg/dL   Comment 1 Notify RN    GLUCOSE, CAPILLARY     Status: Abnormal   Collection Time    11/12/12  4:32 PM      Result Value Range   Glucose-Capillary 123 (*) 70 - 99 mg/dL   Comment 1 Notify RN    GLUCOSE, CAPILLARY     Status: Abnormal   Collection Time    11/12/12  8:27 PM      Result Value Range   Glucose-Capillary 136 (*) 70 - 99 mg/dL   Comment 1 Notify RN    PROTIME-INR     Status: Abnormal   Collection Time    11/13/12  5:45 AM      Result Value Range   Prothrombin Time 23.6 (*) 11.6 - 15.2 seconds   INR 2.21 (*) 0.00 - 1.49  CBC     Status: Abnormal   Collection Time    11/13/12  5:45 AM      Result Value Range   WBC 11.6 (*) 4.0 - 10.5 K/uL   RBC 3.07 (*) 4.22 - 5.81 MIL/uL   Hemoglobin 9.0 (*) 13.0 - 17.0 g/dL   HCT 40.9 (*) 81.1 - 91.4 %   MCV 85.7  78.0 - 100.0 fL   MCH 29.3  26.0 - 34.0 pg   MCHC 34.2  30.0  - 36.0 g/dL   RDW 78.2  95.6 - 21.3 %   Platelets 542 (*) 150 - 400 K/uL  COMPREHENSIVE METABOLIC PANEL     Status: Abnormal   Collection Time    11/13/12  5:45 AM      Result Value Range   Sodium 134 (*) 135 - 145 mEq/L   Potassium 4.6  3.5 - 5.1 mEq/L   Chloride 97  96 - 112 mEq/L   CO2 26  19 - 32 mEq/L   Glucose, Bld 197 (*) 70 - 99 mg/dL   BUN 28 (*) 6 - 23 mg/dL   Creatinine, Ser 0.86  0.50 - 1.35 mg/dL   Calcium 9.2  8.4 - 57.8 mg/dL   Total Protein 7.0  6.0 - 8.3 g/dL   Albumin 3.3 (*) 3.5 - 5.2 g/dL   AST 19  0 - 37 U/L   ALT 21  0 - 53 U/L   Alkaline Phosphatase 63  39 - 117 U/L   Total Bilirubin 0.9  0.3 - 1.2 mg/dL   GFR calc non Af Amer >90  >90 mL/min   GFR calc Af Amer >90  >90 mL/min   Comment:            The eGFR has been calculated     using the CKD EPI equation.     This calculation has not been     validated in all clinical     situations.     eGFR's persistently     <90 mL/min signify     possible Chronic Kidney Disease.    HEENT: normal Lungs clear Heart regular rate and rhythm Abdomen positive  bowel sounds soft nontender palpation Extremities mild to moderate knee effusion, mild erythema around incision site at knees. Decreased range of motion at the knees. More bruising and swelling seen at left knee.  Skin: Subcuticular sutures. No bleeding or drainage Motor strength is 5/5 in bilateral deltoid, biceps, triceps, grip Bilateral hip flexors 3 minus knee extensors 3 minus ankle dose flexors plantar flexor 5/5  Assessment/Plan: 1. Functional deficits secondary to bilateral TKR which require 3+ hours per day of interdisciplinary therapy in a comprehensive inpatient rehab setting. Ready for D/C in am FIM: FIM - Bathing Bathing Steps Patient Completed:  (states he completed this with his wife last night & later ) Bathing: 0: Activity did not occur  FIM - Upper Body Dressing/Undressing Upper body dressing/undressing steps patient completed:  Thread/unthread right sleeve of pullover shirt/dresss;Thread/unthread left sleeve of pullover shirt/dress;Put head through opening of pull over shirt/dress;Pull shirt over trunk Upper body dressing/undressing: 5: Supervision: Safety issues/verbal cues FIM - Lower Body Dressing/Undressing Lower body dressing/undressing steps patient completed: Thread/unthread right pants leg;Thread/unthread left pants leg;Pull pants up/down;Don/Doff right sock;Don/Doff left sock Lower body dressing/undressing: 0: Activity did not occur  FIM - Toileting Toileting steps completed by patient: Adjust clothing prior to toileting;Performs perineal hygiene;Adjust clothing after toileting Toileting Assistive Devices: Grab bar or rail for support Toileting: 0: Activity did not occur  FIM - Diplomatic Services operational officer Devices: Elevated toilet seat Toilet Transfers: 5-To toilet/BSC: Supervision (verbal cues/safety issues)  FIM - Banker Devices: Environmental consultant;Arm rests Bed/Chair Transfer: 5: Bed > Chair or W/C: Supervision (verbal cues/safety issues)  FIM - Locomotion: Wheelchair Distance: 150 Locomotion: Wheelchair: 0: Activity did not occur FIM - Locomotion: Ambulation Locomotion: Ambulation Assistive Devices: Designer, industrial/product Ambulation/Gait Assistance: 5: Supervision Locomotion: Ambulation: 2: Travels 50 - 149 ft with supervision/safety issues  Comprehension Comprehension Mode: Auditory Comprehension: 7-Follows complex conversation/direction: With no assist  Expression Expression Mode: Verbal Expression: 7-Expresses complex ideas: With no assist  Social Interaction Social Interaction: 7-Interacts appropriately with others - No medications needed.  Problem Solving Problem Solving: 7-Solves complex problems: Recognizes & self-corrects  Memory Memory: 7-Complete Independence: No helper  Medical Problem List and Plan:  1. DVT  Prophylaxis/Anticoagulation: Pharmaceutical: Coumadin sub therapeutic cont Lovenox  2. Pain Management: Continue oxycodone as needed. Complaining of muscle aches. Will schedule robaxin.   -encouraged pre-treating pain prior to therapies. Can schedule oxy IR if needed.  -on oxy CR as well q12 3. Mood: Seems to be motivated. No signs of distress. Will monitor and have LCSW follow up for evaluation.  4. Neuropsych: This patient is capable of making decisions on his own behalf.  5. DM type 2: Will monitor with AC/HS cbg checks. Continue tradenta and metformin. Use SSI for elevated BS.Glucotrol recently increased. Sugars better yesterday.  6. HTN: Monitor BS on bid basis. Will check orthostatic BP/Pulse. Set parameters for Norvasc, HCTZ, and Lotensin.  -anemia component to symptoms, encourage fluids as well.  7. ABLA: added iron supplement. Recheck CBC 05/19. Stool OB-  -encouraged pt that his hgb should plateau then slowly increase given the amount of blood loss from two TKA's, coumadin, etc 8. Leucocytosis: Likely reactive.Urine clean..  9. Constipation: scheduled miralax. Prn suppository 10. Hyponatremia resolved after IV fluids  LOS (Days) 7 A FACE TO FACE EVALUATION WAS PERFORMED  KIRSTEINS,ANDREW E 11/13/2012, 7:19 AM

## 2012-11-13 NOTE — Progress Notes (Signed)
Social Work Patient ID: Leonard [name], male   DOB: [birth year], 57 y.o.   MRN: [id number] Pt is requesting to go home today after therapies since he will not get any therapy tomorrow.  Pam-PA aware and will check with MD. Will have DME delivered today and made [facility] aware of change in discharge.

## 2012-11-13 NOTE — Progress Notes (Signed)
Physical Therapy Discharge Summary  Patient Details  Name: Leonard [name] MRN: [id number] Date of Birth: December 19, 1955  Today's Date: 11/13/2012 Time: 4098-1191 and 4782-9562 Time Calculation (min): 45 min and 30 min  Patient has met 9 of 9 long term goals due to improved activity tolerance, improved balance, increased strength, increased range of motion and ability to compensate for deficits.  Patient to discharge at an ambulatory level Modified Independent.   Patient's care partner is not necessary secondary to patient discharging at mod I level to provide the necessary assistance at discharge.  Reasons goals not met: N/A, all LTGs met  Recommendation:  Patient will benefit from ongoing skilled PT services in home health setting (due to patient preference) to continue to advance safe functional mobility, address ongoing impairments in strength of B knees, range of motion of B knees, activity tolerance, balance, gait impairments, functional mobility, and minimize fall risk.  Equipment: rolling walker  Reasons for discharge: treatment goals met and discharge from hospital  Patient/family agrees with progress made and goals achieved: Yes  Skilled Interventions: L Knee Immobilizer discontinued today secondary to patient's ability to perform SLR with less than 10 degree lag. Patient completed car transfer with RW and supervision (secondary to set-up with stowing of RW once patient is seated in car). SW reports that patient's wife wants patient to have wheelchair. Discussion with patient about possible use of wheelchair and that it is not indicated, nor is it the recommendation of the rehab team. Patient states that he does not want a wheelchair and that his wife is "unaware of what (he) is capable of" and "worries too much." Reinforced importance of patient's ambulation to continue to improve functional mobility, functional use of B LEs, and B knee ROM. Patient verbalizes understanding and is  in agreement with recommendations. Followed up with SW for cancellation of wheelchair order.  Provided handout for B TKA exercises to be performed at home and instructed patient in proper technique and frequency. Patient verbalizes understanding and has no further questions at this time.  PT Discharge Precautions/Restrictions Precautions Precautions: Knee Precaution Comments:   Restrictions Weight Bearing Restrictions: Yes RLE Weight Bearing: Weight bearing as tolerated LLE Weight Bearing: Weight bearing as tolerated Pain Pain Assessment Pain Assessment: 0-10 Pain Score:   1 Pain Type: Surgical pain Pain Location: Knee Pain Orientation: Right;Left Pain Descriptors: Aching Pain Onset: With Activity Pain Intervention(s): RN made aware;Ambulation/increased activity;Repositioned Multiple Pain Sites: No Vision/Perception  Vision - History Baseline Vision: Wears glasses only for reading Patient Visual Report: No change from baseline  Cognition Overall Cognitive Status: Within Functional Limits for tasks assessed Arousal/Alertness: Awake/alert Orientation Level: Oriented X4 Sensation Sensation Light Touch: Appears Intact Proprioception: Appears Intact Additional Comments: Proprioception intact at B ankle and great toes. Coordination Gross Motor Movements are Fluid and Coordinated: Yes Fine Motor Movements are Fluid and Coordinated: Yes Motor  Motor Motor: Within Functional Limits  Mobility Bed Mobility Bed Mobility: Supine to Sit;Sit to Supine Supine to Sit: 6: Modified independent (Device/Increase time);HOB flat Sit to Supine: 6: Modified independent (Device/Increase time);HOB flat Transfers Sit to Stand: 6: Modified independent (Device/Increase time);With armrests;With upper extremity assist;From bed;From chair/3-in-1 Stand to Sit: 6: Modified independent (Device/Increase time);With armrests;With upper extremity assist;To bed;To chair/3-in-1 Stand Pivot Transfers: 6:  Modified independent (Device/Increase time);With armrests Locomotion  Ambulation Ambulation: Yes Ambulation/Gait Assistance: 6: Modified independent (Device/Increase time) Ambulation Distance (Feet): 150 Feet x2, 250' x2 (including on carpet, tile, controlled environments, home environments, and community environments-negotiating thresholds obstacles, people,  crowded, etc.) Assistive device: Rolling walker Gait Gait: Yes Gait Pattern: Impaired Gait Pattern: Trunk flexed;Decreased stride length;Decreased step length - left;Decreased step length - right;Decreased hip/knee flexion - left;Decreased hip/knee flexion - right;Step-through pattern;Wide base of support Stairs / Additional Locomotion Stairs: Yes Stairs Assistance: 6: Modified independent (Device/Increase time) Stair Management Technique: One rail Right (ascending);Step to pattern;Sideways Number of Stairs: 12 Height of Stairs: 6 Curb: 6: Modified independent (Device/increase time) (with RW, performed x2) Wheelchair Mobility Wheelchair Mobility: No  Trunk/Postural Assessment  Cervical Assessment Cervical Assessment: Within Functional Limits Thoracic Assessment Thoracic Assessment: Within Functional Limits Lumbar Assessment Lumbar Assessment: Within Functional Limits Postural Control Postural Control: Within Functional Limits  Balance Balance Balance Assessed: Yes Static Sitting Balance Static Sitting - Balance Support: Feet supported;No upper extremity supported Static Sitting - Level of Assistance: 6: Modified independent (Device/Increase time) Dynamic Sitting Balance Dynamic Sitting - Balance Support: Feet supported;During functional activity;No upper extremity supported Dynamic Sitting - Level of Assistance: 6: Modified independent (Device/Increase time) Static Standing Balance Static Standing - Balance Support: Bilateral upper extremity supported;During functional activity Static Standing - Level of Assistance: 6:  Modified independent (Device/Increase time) Extremity Assessment  RLE Assessment RLE Assessment: Exceptions to [facility] RLE AROM (degrees) Right Knee Extension: lacking 6 Right Knee Flexion: 79 RLE PROM (degrees) Right Knee Extension: lacking 4 Right Knee Flexion: 85 RLE Strength RLE Overall Strength: Deficits;Due to pain RLE Overall Strength Comments: Unable to formally assess MMT secondary to pain; Ankle DF/PF: 4/5, Able to perform SLR, functionally 3/5 grossly LLE Assessment LLE Assessment: Exceptions to [facility] LLE AROM (degrees) Left Knee Extension: lacking 11 Left Knee Flexion: 67 LLE PROM (degrees) Left Knee Extension: lacking 8 Left Knee Flexion: 72 LLE Strength LLE Overall Strength: Deficits;Due to pain LLE Overall Strength Comments: Unable to formally assess MMT secondary to pain; Ankle DF/PF: 4/5, Able to perform SLR; functionally 3/5 grossly  All ROM measurements based on patient tolerance and stretching ceased after patient requests to stop.  See FIM for current functional status  Chipper Herb. Ikeya Brockel, PT, DPT  11/13/2012, 1:56 PM

## 2012-11-13 NOTE — Progress Notes (Signed)
Occupational Therapy Session Note  Patient Details  Name: Leonard [name] MRN: [id number] Date of Birth: 07-Aug-1955  Today's Date: 11/13/2012 Time: 0800-0855 Time Calculation (min): 55 min  Short Term Goals: Week 1:  OT Short Term Goal 1 (Week 1): Pt will complete LB dressing and bathing tasks with min assist using AE/DME prn OT Short Term Goal 2 (Week 1): Pt will tolerate standing at sink for 3 min during grooming task.  OT Short Term Goal 3 (Week 1): Pt will tolerate standing in shower with min-steadying assist to wash 4/10 body parts  OT Short Term Goal 4 (Week 1): Pt will complete toilet transfer with supervision assist   Skilled Therapeutic Interventions/Progress Updates:    Pt performed shower and dressing this am at an overall modified independent level.  Utilized RW for gathering of clothing and all mobility.  Able to perform all bathing including feet sitting on 3:1 in the walk-in shower.  Pt did require assistance with TEDS but unsure if he will have to wear them.  Also practiced walk-in shower in the ADL apartment using the dimensions he gave therapist.  Also able to perform with simulated home setup and supervision.  Therapy Documentation Precautions:  Precautions Precautions: Knee;Fall Precaution Comments:   Required Braces or Orthoses: Knee Immobilizer - Left Knee Immobilizer - Right:  (Discontinued R KI 11/09/12; pt able to perform SLR on R) Knee Immobilizer - Left: Discontinue once straight leg raise with < 10 degree lag Restrictions Weight Bearing Restrictions: No RLE Weight Bearing: Weight bearing as tolerated LLE Weight Bearing: Weight bearing as tolerated  Pain: Pain Assessment Pain Assessment: 0-10 Pain Score:   2 Pain Type: Surgical pain Pain Location: Knee Pain Orientation: Right;Left Pain Descriptors: Aching Pain Frequency: Intermittent Pain Onset: Gradual Pain Intervention(s): Medication (See eMAR);Repositioned ADL: See FIM for current functional  status  Therapy/Group: Individual Therapy  Corleen Otwell OTR/L 11/13/2012, 9:03 AM

## 2012-11-13 NOTE — Progress Notes (Signed)
ANTICOAGULATION CONSULT NOTE - Follow Up Consult  Pharmacy Consult for coumadin Indication: VTE prophylaxis  Allergies  Allergen Reactions  . Ultram (Tramadol)     Moderate disorientation    Patient Measurements: Height: 6' (182.9 cm) Weight: 261 lb 0.4 oz (118.4 kg) IBW/kg (Calculated) : 77.6 Heparin Dosing Weight:   Vital Signs: Temp: 98.2 F (36.8 C) (05/19 0500) Temp src: Oral (05/19 0500) BP: 149/90 mmHg (05/19 0628) Pulse Rate: 99 (05/19 0502)  Labs:  Recent Labs  11/11/12 0550 11/12/12 0510 11/13/12 0545  HGB  --   --  9.0*  HCT  --   --  26.3*  PLT  --   --  542*  LABPROT 22.9* 24.8* 23.6*  INR 2.13* 2.37* 2.21*  CREATININE  --   --  0.97    Estimated Creatinine Clearance: 112.9 ml/min (by C-G formula based on Cr of 0.97).   Medications:  Scheduled:  . allopurinol  100 mg Oral QAC breakfast  . amLODipine  10 mg Oral QHS  . atorvastatin  20 mg Oral QHS  . feeding supplement  120 mL Oral QID  . glipiZIDE  10 mg Oral QAC breakfast  . hydrochlorothiazide  25 mg Oral QAC breakfast  . insulin aspart  0-20 Units Subcutaneous TID WC  . insulin aspart  0-5 Units Subcutaneous QHS  . iron polysaccharides  150 mg Oral BID AC  . linagliptin  5 mg Oral QAC breakfast   And  . metFORMIN  500 mg Oral BID WC  . lisinopril  40 mg Oral QHS  . methocarbamol  500-1,000 mg Oral QID  . OxyCODONE  10 mg Oral Q12H  . polyethylene glycol  17 g Oral BID  . potassium citrate  10 mEq Oral BID  . sodium chloride  2 spray Each Nare TID AC & HS  . tamsulosin  0.4 mg Oral QHS  . warfarin  10 mg Oral q1800  . Warfarin - Pharmacist Dosing Inpatient   Does not apply q1800   Infusions:    Assessment: 57 yo male s/p bilateral TKA is currently on therapeutic coumadin.  INR is 2.21. Goal of Therapy:  INR 2-3    Plan:  1) Continue coumadin 10mg  po qday 2) INR in am. If INR good tom, can change INR to TThSa  Karo Rog, Tsz-Yin 11/13/2012,8:25 AM

## 2012-11-21 ENCOUNTER — Encounter: Payer: Self-pay | Admitting: Cardiology

## 2012-11-23 ENCOUNTER — Ambulatory Visit: Payer: BC Managed Care – PPO | Admitting: Internal Medicine

## 2012-11-24 ENCOUNTER — Encounter: Payer: BC Managed Care – PPO | Admitting: Physical Therapy

## 2012-11-28 ENCOUNTER — Encounter: Payer: Self-pay | Admitting: Lab

## 2012-11-29 ENCOUNTER — Encounter: Payer: Self-pay | Admitting: Internal Medicine

## 2012-11-29 ENCOUNTER — Ambulatory Visit (INDEPENDENT_AMBULATORY_CARE_PROVIDER_SITE_OTHER): Payer: BC Managed Care – PPO | Admitting: Internal Medicine

## 2012-11-29 VITALS — BP 130/82 | HR 84 | Temp 98.3°F | Wt 229.0 lb

## 2012-11-29 DIAGNOSIS — E871 Hypo-osmolality and hyponatremia: Secondary | ICD-10-CM

## 2012-11-29 DIAGNOSIS — I10 Essential (primary) hypertension: Secondary | ICD-10-CM

## 2012-11-29 DIAGNOSIS — M1710 Unilateral primary osteoarthritis, unspecified knee: Secondary | ICD-10-CM

## 2012-11-29 DIAGNOSIS — IMO0002 Reserved for concepts with insufficient information to code with codable children: Secondary | ICD-10-CM

## 2012-11-29 DIAGNOSIS — E119 Type 2 diabetes mellitus without complications: Secondary | ICD-10-CM

## 2012-11-29 DIAGNOSIS — D62 Acute posthemorrhagic anemia: Secondary | ICD-10-CM

## 2012-11-29 LAB — BASIC METABOLIC PANEL
CO2: 27 mEq/L (ref 19–32)
Calcium: 9.3 mg/dL (ref 8.4–10.5)
Chloride: 105 mEq/L (ref 96–112)
Creatinine, Ser: 0.9 mg/dL (ref 0.4–1.5)
Sodium: 139 mEq/L (ref 135–145)

## 2012-11-29 LAB — CBC WITH DIFFERENTIAL/PLATELET
Basophils Absolute: 0 10*3/uL (ref 0.0–0.1)
Eosinophils Absolute: 0.1 10*3/uL (ref 0.0–0.7)
Hemoglobin: 11.1 g/dL — ABNORMAL LOW (ref 13.0–17.0)
Lymphocytes Relative: 24.5 % (ref 12.0–46.0)
MCHC: 33.2 g/dL (ref 30.0–36.0)
Monocytes Relative: 9.3 % (ref 3.0–12.0)
Neutro Abs: 4.6 10*3/uL (ref 1.4–7.7)
Platelets: 412 10*3/uL — ABNORMAL HIGH (ref 150.0–400.0)
RDW: 14.4 % (ref 11.5–14.6)

## 2012-11-29 NOTE — Assessment & Plan Note (Signed)
Recuperating from bilateral total knee replacement 11-01-2012 Pain control with oxycodone 5 mg in the morning, one in the afternoon and Tylenol twice a day. Recommend to gradually decrease the dose of OxyContin.

## 2012-11-29 NOTE — Assessment & Plan Note (Signed)
CBGs were elevated in the hospital, he did require insulin. CBGs at home currently in the 160s, increased Glucotrol from 5 mg daily to ----->  5 mg in the morning and 10 in the afternoon. Recommend to take his Glucotrol dose always before meals. Also recommend to gradually decrease the dose as I anticipate his CBGs will decrease as he gets more active. Patient verbalized understanding.
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
## 2012-11-29 NOTE — Patient Instructions (Signed)
Next visit 2-3 months

## 2012-11-29 NOTE — Assessment & Plan Note (Signed)
Ambulatory BPs 1 4080, no change, checking a BMP

## 2012-11-30 ENCOUNTER — Ambulatory Visit: Payer: BC Managed Care – PPO | Attending: Orthopedic Surgery | Admitting: Physical Therapy

## 2012-11-30 DIAGNOSIS — R262 Difficulty in walking, not elsewhere classified: Secondary | ICD-10-CM | POA: Insufficient documentation

## 2012-11-30 DIAGNOSIS — IMO0001 Reserved for inherently not codable concepts without codable children: Secondary | ICD-10-CM | POA: Insufficient documentation

## 2012-11-30 DIAGNOSIS — M25569 Pain in unspecified knee: Secondary | ICD-10-CM | POA: Insufficient documentation

## 2012-11-30 DIAGNOSIS — M7989 Other specified soft tissue disorders: Secondary | ICD-10-CM | POA: Insufficient documentation

## 2012-12-01 ENCOUNTER — Ambulatory Visit: Payer: BC Managed Care – PPO | Admitting: Physical Therapy

## 2012-12-04 ENCOUNTER — Ambulatory Visit: Payer: BC Managed Care – PPO | Admitting: Physical Therapy

## 2012-12-06 ENCOUNTER — Ambulatory Visit: Payer: BC Managed Care – PPO | Admitting: Physical Therapy

## 2012-12-08 ENCOUNTER — Ambulatory Visit: Payer: BC Managed Care – PPO | Admitting: Physical Therapy

## 2012-12-11 ENCOUNTER — Ambulatory Visit: Payer: BC Managed Care – PPO | Admitting: Physical Therapy

## 2012-12-13 ENCOUNTER — Ambulatory Visit: Payer: BC Managed Care – PPO | Admitting: Physical Therapy

## 2012-12-15 ENCOUNTER — Ambulatory Visit: Payer: BC Managed Care – PPO | Admitting: Physical Therapy

## 2012-12-18 ENCOUNTER — Ambulatory Visit: Payer: BC Managed Care – PPO | Admitting: Physical Therapy

## 2012-12-18 ENCOUNTER — Telehealth: Payer: Self-pay | Admitting: *Deleted

## 2012-12-18 MED ORDER — CLONAZEPAM 0.5 MG PO TABS: 0.2500 mg | ORAL_TABLET | Freq: Two times a day (BID) | ORAL | Status: DC | PRN

## 2012-12-18 NOTE — Telephone Encounter (Signed)
RF done. .

## 2012-12-18 NOTE — Telephone Encounter (Signed)
Refill request for clonazepam 0.5mg  Last OV 6.4.14

## 2012-12-20 ENCOUNTER — Ambulatory Visit: Payer: BC Managed Care – PPO | Admitting: Physical Therapy

## 2012-12-22 ENCOUNTER — Ambulatory Visit: Payer: BC Managed Care – PPO | Admitting: Physical Therapy

## 2012-12-25 ENCOUNTER — Other Ambulatory Visit: Payer: Self-pay | Admitting: *Deleted

## 2012-12-25 ENCOUNTER — Ambulatory Visit: Payer: BC Managed Care – PPO | Admitting: Physical Therapy

## 2012-12-25 MED ORDER — GLIPIZIDE 10 MG PO TABS: ORAL_TABLET | ORAL | Status: DC

## 2012-12-25 NOTE — Telephone Encounter (Signed)
Rx sent 

## 2012-12-27 ENCOUNTER — Ambulatory Visit: Payer: BC Managed Care – PPO | Attending: Orthopedic Surgery | Admitting: Physical Therapy

## 2012-12-27 DIAGNOSIS — IMO0001 Reserved for inherently not codable concepts without codable children: Secondary | ICD-10-CM | POA: Insufficient documentation

## 2012-12-27 DIAGNOSIS — R262 Difficulty in walking, not elsewhere classified: Secondary | ICD-10-CM | POA: Insufficient documentation

## 2012-12-27 DIAGNOSIS — M25569 Pain in unspecified knee: Secondary | ICD-10-CM | POA: Insufficient documentation

## 2012-12-27 DIAGNOSIS — M7989 Other specified soft tissue disorders: Secondary | ICD-10-CM | POA: Insufficient documentation

## 2012-12-28 ENCOUNTER — Encounter: Payer: BC Managed Care – PPO | Admitting: Physical Therapy

## 2013-01-03 ENCOUNTER — Ambulatory Visit: Payer: BC Managed Care – PPO | Admitting: Physical Therapy

## 2013-01-05 ENCOUNTER — Ambulatory Visit: Payer: BC Managed Care – PPO | Admitting: Physical Therapy

## 2013-01-08 ENCOUNTER — Ambulatory Visit: Payer: BC Managed Care – PPO | Admitting: Physical Therapy

## 2013-01-10 ENCOUNTER — Ambulatory Visit: Payer: BC Managed Care – PPO | Admitting: Physical Therapy

## 2013-01-12 ENCOUNTER — Ambulatory Visit: Payer: BC Managed Care – PPO | Admitting: Physical Therapy

## 2013-01-15 ENCOUNTER — Ambulatory Visit: Payer: BC Managed Care – PPO | Admitting: Physical Therapy

## 2013-01-17 ENCOUNTER — Ambulatory Visit: Payer: BC Managed Care – PPO | Admitting: Internal Medicine

## 2013-01-17 ENCOUNTER — Ambulatory Visit: Payer: BC Managed Care – PPO | Admitting: Physical Therapy

## 2013-01-19 ENCOUNTER — Ambulatory Visit: Payer: BC Managed Care – PPO | Admitting: Cardiology

## 2013-01-19 ENCOUNTER — Ambulatory Visit: Payer: BC Managed Care – PPO | Admitting: Physical Therapy

## 2013-01-22 ENCOUNTER — Ambulatory Visit: Payer: BC Managed Care – PPO | Admitting: Physical Therapy

## 2013-01-24 ENCOUNTER — Ambulatory Visit: Payer: BC Managed Care – PPO | Admitting: Physical Therapy

## 2013-01-26 ENCOUNTER — Ambulatory Visit: Payer: BC Managed Care – PPO | Admitting: Physical Therapy

## 2013-01-31 ENCOUNTER — Ambulatory Visit (INDEPENDENT_AMBULATORY_CARE_PROVIDER_SITE_OTHER): Payer: BC Managed Care – PPO | Admitting: Internal Medicine

## 2013-01-31 VITALS — BP 120/78 | HR 88 | Temp 97.7°F | Wt 237.8 lb

## 2013-01-31 DIAGNOSIS — E119 Type 2 diabetes mellitus without complications: Secondary | ICD-10-CM

## 2013-01-31 DIAGNOSIS — I10 Essential (primary) hypertension: Secondary | ICD-10-CM

## 2013-01-31 DIAGNOSIS — F341 Dysthymic disorder: Secondary | ICD-10-CM

## 2013-01-31 DIAGNOSIS — F329 Major depressive disorder, single episode, unspecified: Secondary | ICD-10-CM

## 2013-01-31 DIAGNOSIS — F419 Anxiety disorder, unspecified: Secondary | ICD-10-CM

## 2013-01-31 LAB — HEMOGLOBIN A1C: Hgb A1c MFr Bld: 6.3 % (ref 4.6–6.5)
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
## 2013-01-31 NOTE — Assessment & Plan Note (Signed)
Since his last time he was here, he is more physically active, CBGs decreasing, currently taking glipizide as needed other meds takes as Rx, plan is to check A1c.

## 2013-01-31 NOTE — Assessment & Plan Note (Signed)
Well-controlled, no change 

## 2013-01-31 NOTE — Patient Instructions (Addendum)
Next visit in 3 to 4 months, fasting for a physical Call if blood sugars are not well controlled  Check the  blood pressure 2 or 3 times a week, be sure it is between 110/60 and 140/85. If it is consistently higher or lower, let me know

## 2013-01-31 NOTE — Assessment & Plan Note (Signed)
On clonazepam as needed, plan--UDS

## 2013-02-01 ENCOUNTER — Encounter: Payer: Self-pay | Admitting: Internal Medicine

## 2013-02-16 ENCOUNTER — Ambulatory Visit: Payer: BC Managed Care – PPO | Admitting: Cardiology

## 2013-03-07 ENCOUNTER — Other Ambulatory Visit: Payer: Self-pay | Admitting: Internal Medicine

## 2013-03-07 NOTE — Telephone Encounter (Signed)
rx refilled per protocol. DJR  

## 2013-03-09 ENCOUNTER — Encounter: Payer: Self-pay | Admitting: Internal Medicine

## 2013-04-12 ENCOUNTER — Other Ambulatory Visit: Payer: Self-pay | Admitting: Internal Medicine

## 2013-04-13 ENCOUNTER — Other Ambulatory Visit: Payer: Self-pay | Admitting: *Deleted

## 2013-04-13 MED ORDER — AMLODIPINE BESY-BENAZEPRIL HCL 10-40 MG PO CAPS: 1.0000 | ORAL_CAPSULE | Freq: Every day | ORAL | Status: DC

## 2013-04-13 MED ORDER — GLUCOSE BLOOD VI STRP: ORAL_STRIP | Status: DC

## 2013-04-13 MED ORDER — ATORVASTATIN CALCIUM 20 MG PO TABS: 20.0000 mg | ORAL_TABLET | Freq: Every day | ORAL | Status: DC

## 2013-04-13 NOTE — Telephone Encounter (Signed)
Atorvastatin, Amlodipine and glucose strip refills sent to pharmacy

## 2013-04-19 ENCOUNTER — Encounter: Payer: Self-pay | Admitting: Cardiology

## 2013-04-19 ENCOUNTER — Ambulatory Visit (INDEPENDENT_AMBULATORY_CARE_PROVIDER_SITE_OTHER): Payer: BC Managed Care – PPO | Admitting: Cardiology

## 2013-04-19 VITALS — BP 132/90 | HR 67 | Ht 72.0 in | Wt 249.0 lb

## 2013-04-19 DIAGNOSIS — I10 Essential (primary) hypertension: Secondary | ICD-10-CM

## 2013-04-19 DIAGNOSIS — R9431 Abnormal electrocardiogram [ECG] [EKG]: Secondary | ICD-10-CM

## 2013-04-19 DIAGNOSIS — Z79899 Other long term (current) drug therapy: Secondary | ICD-10-CM

## 2013-04-19 DIAGNOSIS — E785 Hyperlipidemia, unspecified: Secondary | ICD-10-CM

## 2013-04-19 LAB — HEPATIC FUNCTION PANEL
ALT: 20 U/L (ref 0–53)
Albumin: 4.5 g/dL (ref 3.5–5.2)
Alkaline Phosphatase: 70 U/L (ref 39–117)
Bilirubin, Direct: 0.1 mg/dL (ref 0.0–0.3)
Total Protein: 7.9 g/dL (ref 6.0–8.3)

## 2013-04-19 LAB — LIPID PANEL
Cholesterol: 106 mg/dL (ref 0–200)
Triglycerides: 112 mg/dL (ref 0.0–149.0)
VLDL: 22.4 mg/dL (ref 0.0–40.0)

## 2013-04-19 NOTE — Patient Instructions (Signed)
The current medical regimen is effective;  continue present plan and medications.  Please have fasting lab work today  (lipid and liver)  Follow up in 1 year with Dr Antoine Poche.  You will receive a letter in the mail 2 months before you are due.  Please call us when you receive this letter to schedule your follow up appointment.
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
## 2013-05-03 ENCOUNTER — Other Ambulatory Visit: Payer: Self-pay

## 2013-06-07 ENCOUNTER — Ambulatory Visit (INDEPENDENT_AMBULATORY_CARE_PROVIDER_SITE_OTHER): Payer: BC Managed Care – PPO | Admitting: Internal Medicine

## 2013-06-07 ENCOUNTER — Encounter: Payer: Self-pay | Admitting: Internal Medicine

## 2013-06-07 VITALS — BP 126/78 | HR 95 | Temp 98.1°F | Ht 72.5 in | Wt 257.0 lb

## 2013-06-07 DIAGNOSIS — F341 Dysthymic disorder: Secondary | ICD-10-CM

## 2013-06-07 DIAGNOSIS — F329 Major depressive disorder, single episode, unspecified: Secondary | ICD-10-CM

## 2013-06-07 DIAGNOSIS — Z0000 Encounter for general adult medical examination without abnormal findings: Secondary | ICD-10-CM

## 2013-06-07 DIAGNOSIS — Z23 Encounter for immunization: Secondary | ICD-10-CM

## 2013-06-07 DIAGNOSIS — D62 Acute posthemorrhagic anemia: Secondary | ICD-10-CM

## 2013-06-07 DIAGNOSIS — E785 Hyperlipidemia, unspecified: Secondary | ICD-10-CM

## 2013-06-07 DIAGNOSIS — E119 Type 2 diabetes mellitus without complications: Secondary | ICD-10-CM

## 2013-06-07 LAB — CBC WITH DIFFERENTIAL/PLATELET
Eosinophils Relative: 2.6 % (ref 0.0–5.0)
Lymphocytes Relative: 27.3 % (ref 12.0–46.0)
Monocytes Relative: 8.5 % (ref 3.0–12.0)
Neutrophils Relative %: 61.2 % (ref 43.0–77.0)
Platelets: 315 10*3/uL (ref 150.0–400.0)
RBC: 5.16 Mil/uL (ref 4.22–5.81)
WBC: 8 10*3/uL (ref 4.5–10.5)

## 2013-06-07 LAB — HEMOGLOBIN A1C: Hgb A1c MFr Bld: 6.4 % (ref 4.6–6.5)

## 2013-06-07 LAB — IRON: Iron: 69 ug/dL (ref 42–165)

## 2013-06-07 LAB — BASIC METABOLIC PANEL
BUN: 23 mg/dL (ref 6–23)
Creatinine, Ser: 1.1 mg/dL (ref 0.4–1.5)
GFR: 71.74 mL/min (ref >60.00)

## 2013-06-07 LAB — FERRITIN: Ferritin: 32 ng/mL (ref 22.0–322.0)

## 2013-06-07 MED ORDER — CLONAZEPAM 0.5 MG PO TABS: 0.5000 mg | ORAL_TABLET | Freq: Three times a day (TID) | ORAL | Status: DC | PRN

## 2013-06-07 MED ORDER — ESCITALOPRAM OXALATE 10 MG PO TABS: 10.0000 mg | ORAL_TABLET | Freq: Every day | ORAL | Status: DC

## 2013-06-07 NOTE — Assessment & Plan Note (Signed)
Lifestyle has definitely deteriorated, patient is counseled, continue same meds, check a  A1c

## 2013-06-07 NOTE — Assessment & Plan Note (Signed)
Well-controlled per last FLP 

## 2013-06-07 NOTE — Patient Instructions (Signed)
Get your blood work before you leave  Next visit for a   follow up   regards diabetes   depression , no fasting, in 1 month Please make an appointment    Start Lexapro, watch for increased suicidal ideas     Fall Prevention and Home Safety Falls cause injuries and can affect all age groups. It is possible to use preventive measures to significantly decrease the likelihood of falls. There are many simple measures which can make your home safer and prevent falls. OUTDOORS  Repair cracks and edges of walkways and driveways.  Remove high doorway thresholds.  Trim shrubbery on the main path into your home.  Have good outside lighting.  Clear walkways of tools, rocks, debris, and clutter.  Check that handrails are not broken and are securely fastened. Both sides of steps should have handrails.  Have leaves, snow, and ice cleared regularly.  Use sand or salt on walkways during winter months.  In the garage, clean up grease or oil spills. BATHROOM  Install night lights.  Install grab bars by the toilet and in the tub and shower.  Use non-skid mats or decals in the tub or shower.  Place a plastic non-slip stool in the shower to sit on, if needed.  Keep floors dry and clean up all water on the floor immediately.  Remove soap buildup in the tub or shower on a regular basis.  Secure bath mats with non-slip, double-sided rug tape.  Remove throw rugs and tripping hazards from the floors. BEDROOMS  Install night lights.  Make sure a bedside light is easy to reach.  Do not use oversized bedding.  Keep a telephone by your bedside.  Have a firm chair with side arms to use for getting dressed.  Remove throw rugs and tripping hazards from the floor. KITCHEN  Keep handles on pots and pans turned toward the center of the stove. Use back burners when possible.  Clean up spills quickly and allow time for drying.  Avoid walking on wet floors.  Avoid hot utensils and  knives.  Position shelves so they are not too high or low.  Place commonly used objects within easy reach.  If necessary, use a sturdy step stool with a grab bar when reaching.  Keep electrical cables out of the way.  Do not use floor polish or wax that makes floors slippery. If you must use wax, use non-skid floor wax.  Remove throw rugs and tripping hazards from the floor. STAIRWAYS  Never leave objects on stairs.  Place handrails on both sides of stairways and use them. Fix any loose handrails. Make sure handrails on both sides of the stairways are as long as the stairs.  Check carpeting to make sure it is firmly attached along stairs. Make repairs to worn or loose carpet promptly.  Avoid placing throw rugs at the top or bottom of stairways, or properly secure the rug with carpet tape to prevent slippage. Get rid of throw rugs, if possible.  Have an electrician put in a light switch at the top and bottom of the stairs. OTHER FALL PREVENTION TIPS  Wear low-heel or rubber-soled shoes that are supportive and fit well. Wear closed toe shoes.  When using a stepladder, make sure it is fully opened and both spreaders are firmly locked. Do not climb a closed stepladder.  Add color or contrast paint or tape to grab bars and handrails in your home. Place contrasting color strips on first and last steps.  Learn and use mobility aids as needed. Install an electrical emergency response system.  Turn on lights to avoid dark areas. Replace light bulbs that burn out immediately. Get light switches that glow.  Arrange furniture to create clear pathways. Keep furniture in the same place.  Firmly attach carpet with non-skid or double-sided tape.  Eliminate uneven floor surfaces.  Select a carpet pattern that does not visually hide the edge of steps.  Be aware of all pets. OTHER HOME SAFETY TIPS  Set the water temperature for 120 F (48.8 C).  Keep emergency numbers on or near the  telephone.  Keep smoke detectors on every level of the home and near sleeping areas. Document Released: 06/04/2002 Document Revised: 12/14/2011 Document Reviewed: 09/03/2011 [facility] Patient Information 2014 [place].

## 2013-06-07 NOTE — Assessment & Plan Note (Addendum)
See history of present illness, symptoms worse. PHQ 9--- 17 The patient had a conversation with Dr. Dub Mikes who treat his daughter, she is responding very well to Lexapro and they felt that he would benefit from it and a increased dose of clonopin. See previous entries, history of bipolar. Plan: Start Lexapro 10, reassess in one month, increased to 20 mg?. Watch for suicidality. Increase Klonopin 0.25 twice a day to 0.5 mg  3 times a day UDS 01-2013 low risk. counseling!

## 2013-06-07 NOTE — Progress Notes (Signed)
Pre visit review using our clinic review tool, if applicable. No additional management support is needed unless otherwise documented below in the visit note. 

## 2013-06-07 NOTE — Assessment & Plan Note (Addendum)
Td today flu shot today PNM shot - at next visit  zostavax -- discussed  PSAs per urology, sees  Dr. Logan Bores  Last visit July 2014 : all normal   reports a Cscope at age 57---normal per patient ,I still don't see the  records  See history of present illness, lifestyle has not been the best-- counseled
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
## 2013-06-14 LAB — HM DIABETES EYE EXAM

## 2013-06-20 ENCOUNTER — Other Ambulatory Visit: Payer: Self-pay | Admitting: Internal Medicine

## 2013-06-22 NOTE — Telephone Encounter (Signed)
Janumet and Glipizide refilled per protocol. JG//CMA

## 2013-07-04 ENCOUNTER — Encounter: Payer: Self-pay | Admitting: Internal Medicine

## 2013-07-04 ENCOUNTER — Other Ambulatory Visit: Payer: Self-pay | Admitting: Internal Medicine

## 2013-07-17 ENCOUNTER — Telehealth: Payer: Self-pay | Admitting: *Deleted

## 2013-07-17 MED ORDER — CLONAZEPAM 0.5 MG PO TABS: 0.5000 mg | ORAL_TABLET | Freq: Three times a day (TID) | ORAL | Status: DC | PRN

## 2013-07-17 NOTE — Telephone Encounter (Signed)
clonazePAM (KLONOPIN) 0.5 MG tablet Last refill: 06/07/13 #90, 0 refills Last OV: 06/07/13 UDS up-to-date, low risk

## 2013-07-17 NOTE — Telephone Encounter (Signed)
Faxed script to pharmacy. JG//CMA

## 2013-07-17 NOTE — Telephone Encounter (Signed)
Done

## 2013-07-19 ENCOUNTER — Encounter: Payer: Self-pay | Admitting: Internal Medicine

## 2013-07-19 ENCOUNTER — Ambulatory Visit (INDEPENDENT_AMBULATORY_CARE_PROVIDER_SITE_OTHER): Payer: BC Managed Care – PPO | Admitting: Internal Medicine

## 2013-07-19 VITALS — BP 126/81 | HR 78 | Temp 98.2°F | Wt 262.0 lb

## 2013-07-19 DIAGNOSIS — F32A Depression, unspecified: Secondary | ICD-10-CM

## 2013-07-19 DIAGNOSIS — F419 Anxiety disorder, unspecified: Principal | ICD-10-CM

## 2013-07-19 DIAGNOSIS — Z23 Encounter for immunization: Secondary | ICD-10-CM

## 2013-07-19 DIAGNOSIS — F341 Dysthymic disorder: Secondary | ICD-10-CM

## 2013-07-19 DIAGNOSIS — F329 Major depressive disorder, single episode, unspecified: Secondary | ICD-10-CM

## 2013-07-19 MED ORDER — ESCITALOPRAM OXALATE 10 MG PO TABS: 20.0000 mg | ORAL_TABLET | Freq: Every day | ORAL | Status: DC
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
## 2013-07-19 NOTE — Assessment & Plan Note (Signed)
Good compliance clonazepam 3 times a day and Lexapro. States that clonazepam to help significantly w/ anxiety, depression has decrease "50%", still feels that he is not where he needs to be, still has some problems functioning at work. We agreed to continue clonazepam, increase Lexapro to 20 mg (self decreased to 15 mg if needed). Has not seek counseling, will revisit the issue next time Followup 2 months

## 2013-07-19 NOTE — Progress Notes (Signed)
Pre visit review using our clinic review tool, if applicable. No additional management support is needed unless otherwise documented below in the visit note. 

## 2013-07-19 NOTE — Patient Instructions (Signed)
Next visit is for routine check up regards anxiety  in 2 months  No need to come back fasting Please make an appointment

## 2013-07-25 ENCOUNTER — Other Ambulatory Visit: Payer: Self-pay | Admitting: Internal Medicine

## 2013-09-07 ENCOUNTER — Other Ambulatory Visit: Payer: Self-pay | Admitting: Internal Medicine

## 2013-09-20 ENCOUNTER — Ambulatory Visit (INDEPENDENT_AMBULATORY_CARE_PROVIDER_SITE_OTHER): Payer: BC Managed Care – PPO | Admitting: Internal Medicine

## 2013-09-20 ENCOUNTER — Encounter: Payer: Self-pay | Admitting: Internal Medicine

## 2013-09-20 VITALS — BP 101/65 | HR 79 | Temp 98.3°F | Wt 260.0 lb

## 2013-09-20 DIAGNOSIS — F341 Dysthymic disorder: Secondary | ICD-10-CM

## 2013-09-20 DIAGNOSIS — F329 Major depressive disorder, single episode, unspecified: Secondary | ICD-10-CM

## 2013-09-20 DIAGNOSIS — E119 Type 2 diabetes mellitus without complications: Secondary | ICD-10-CM

## 2013-09-20 DIAGNOSIS — F419 Anxiety disorder, unspecified: Secondary | ICD-10-CM

## 2013-09-20 NOTE — Assessment & Plan Note (Addendum)
Life style improving. Feet care reviewed, has numbness at the left foot. Lifestyle improving, may develop hypoglycemia, if that is the case will stop glipizide if he started to experience low blood sugars

## 2013-09-20 NOTE — Assessment & Plan Note (Signed)
See previous entry, since the last visit he is doing great. Anxiety 60% decrease, depression is under excellent control. Plan: No change
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
## 2013-09-20 NOTE — Patient Instructions (Addendum)
Next visit is for routine check up regards your blood sugar, depression or anxiety in 2-3 months  No need to come back fasting Please make an appointment       Diabetes and Foot Care Diabetes may cause you to have problems because of poor blood supply (circulation) to your feet and legs. This may cause the skin on your feet to become thinner, break easier, and heal more slowly. Your skin may become dry, and the skin may peel and crack. You may also have nerve damage in your legs and feet causing decreased feeling in them. You may not notice minor injuries to your feet that could lead to infections or more serious problems. Taking care of your feet is one of the most important things you can do for yourself.  HOME CARE INSTRUCTIONS  Wear shoes at all times, even in the house. Do not go barefoot. Bare feet are easily injured.  Check your feet daily for blisters, cuts, and redness. If you cannot see the bottom of your feet, use a mirror or ask someone for help.  Wash your feet with warm water (do not use hot water) and mild soap. Then pat your feet and the areas between your toes until they are completely dry. Do not soak your feet as this can dry your skin.  Apply a moisturizing lotion or petroleum jelly (that does not contain alcohol and is unscented) to the skin on your feet and to dry, brittle toenails. Do not apply lotion between your toes.  Trim your toenails straight across. Do not dig under them or around the cuticle. File the edges of your nails with an emery board or nail file.  Do not cut corns or calluses or try to remove them with medicine.  Wear clean socks or stockings every day. Make sure they are not too tight. Do not wear knee-high stockings since they may decrease blood flow to your legs.  Wear shoes that fit properly and have enough cushioning. To break in new shoes, wear them for just a few hours a day. This prevents you from injuring your feet. Always look in your shoes  before you put them on to be sure there are no objects inside.  Do not cross your legs. This may decrease the blood flow to your feet.  If you find a minor scrape, cut, or break in the skin on your feet, keep it and the skin around it clean and dry. These areas may be cleansed with mild soap and water. Do not cleanse the area with peroxide, alcohol, or iodine.  When you remove an adhesive bandage, be sure not to damage the skin around it.  If you have a wound, look at it several times a day to make sure it is healing.  Do not use heating pads or hot water bottles. They may burn your skin. If you have lost feeling in your feet or legs, you may not know it is happening until it is too late.  Make sure your health care provider performs a complete foot exam at least annually or more often if you have foot problems. Report any cuts, sores, or bruises to your health care provider immediately. SEEK MEDICAL CARE IF:   You have an injury that is not healing.  You have cuts or breaks in the skin.  You have an ingrown nail.  You notice redness on your legs or feet.  You feel burning or tingling in your legs or feet.  You have pain or cramps in your legs and feet.  Your legs or feet are numb.  Your feet always feel cold. SEEK IMMEDIATE MEDICAL CARE IF:   There is increasing redness, swelling, or pain in or around a wound.  There is a red line that goes up your leg.  Pus is coming from a wound.  You develop a fever or as directed by your health care provider.  You notice a bad smell coming from an ulcer or wound. Document Released: 06/11/2000 Document Revised: 02/14/2013 Document Reviewed: 11/21/2012 [facility] Patient Information 2014 [place].

## 2013-09-20 NOTE — Progress Notes (Signed)
Pre visit review using our clinic review tool, if applicable. No additional management support is needed unless otherwise documented below in the visit note. 

## 2013-10-02 ENCOUNTER — Telehealth: Payer: Self-pay

## 2013-10-02 NOTE — Telephone Encounter (Signed)
Relevant patient education assigned to patient using Emmi. ° °

## 2013-10-09 ENCOUNTER — Other Ambulatory Visit: Payer: Self-pay | Admitting: Internal Medicine

## 2013-10-10 ENCOUNTER — Other Ambulatory Visit: Payer: Self-pay | Admitting: Internal Medicine

## 2013-10-10 NOTE — Telephone Encounter (Signed)
Rx sent to the pharmacy by e-script.//AB/CMA 

## 2013-11-02 ENCOUNTER — Other Ambulatory Visit: Payer: Self-pay | Admitting: Internal Medicine

## 2013-11-05 ENCOUNTER — Telehealth: Payer: Self-pay | Admitting: *Deleted

## 2013-11-05 MED ORDER — CLONAZEPAM 0.5 MG PO TABS: 0.5000 mg | ORAL_TABLET | Freq: Three times a day (TID) | ORAL | Status: DC | PRN

## 2013-11-05 NOTE — Telephone Encounter (Signed)
clonazePAM (KLONOPIN) 0.5 MG tablet TID PRN Last refill: 07/17/13, #90, 3 refills Last OV: 09/20/2013  UDS due, low risk

## 2013-11-05 NOTE — Telephone Encounter (Signed)
Done

## 2013-11-05 NOTE — Telephone Encounter (Signed)
LMOM @ (5:14pm) informing the pt that his rx he requested was approved, but he will need to pick up the rx because he is due a UDS.  Rx will be placed up front for the pt to pick-up.//AB/CMA

## 2013-11-28 ENCOUNTER — Telehealth: Payer: Self-pay

## 2013-11-28 NOTE — Telephone Encounter (Signed)
UDS: 11/07/2013 Positive for Klonopin  Low risk per Dr Larose Kells

## 2013-12-12 ENCOUNTER — Encounter: Payer: Self-pay | Admitting: Internal Medicine

## 2013-12-21 ENCOUNTER — Ambulatory Visit: Payer: BC Managed Care – PPO | Admitting: Internal Medicine

## 2014-01-04 ENCOUNTER — Other Ambulatory Visit: Payer: Self-pay | Admitting: Internal Medicine

## 2014-01-26 LAB — HM DIABETES EYE EXAM

## 2014-01-31 ENCOUNTER — Telehealth: Payer: Self-pay

## 2014-01-31 MED ORDER — CLONAZEPAM 0.5 MG PO TABS: 0.5000 mg | ORAL_TABLET | Freq: Three times a day (TID) | ORAL | Status: DC | PRN

## 2014-01-31 NOTE — Telephone Encounter (Signed)
Advise patient, prescription printed. Needs office visit, please arrange

## 2014-01-31 NOTE — Telephone Encounter (Signed)
Pt request: Clonazepam 0.5 mg  Last filled: 11/05/2013 # 90 Last OV: 09/20/2013 Last UDS: 11/07/2013 Positive for Klonopin: Low Risk   Please Advise.

## 2014-02-01 ENCOUNTER — Ambulatory Visit: Payer: BC Managed Care – PPO | Admitting: Internal Medicine

## 2014-02-01 NOTE — Telephone Encounter (Signed)
Received Klonopin Rx, sent to Pharmacy.

## 2014-04-04 ENCOUNTER — Other Ambulatory Visit: Payer: Self-pay | Admitting: Internal Medicine

## 2014-04-05 ENCOUNTER — Telehealth: Payer: Self-pay

## 2014-04-05 MED ORDER — CLONAZEPAM 0.5 MG PO TABS: 0.5000 mg | ORAL_TABLET | Freq: Three times a day (TID) | ORAL | Status: DC | PRN

## 2014-04-05 NOTE — Telephone Encounter (Signed)
Faxed to [facility], letter sent to Pt informing him he is due for OV.

## 2014-04-05 NOTE — Telephone Encounter (Signed)
Pt is requesting refill for Clonazepam  Last OV: 09/20/2013  Last Fill: 01/31/2014 # 30 with 5 RF UDS: 11/07/2013 Low risk  Please advise.

## 2014-04-05 NOTE — Telephone Encounter (Signed)
RFdone, please arrange a ROV

## 2014-04-08 ENCOUNTER — Other Ambulatory Visit: Payer: Self-pay | Admitting: Internal Medicine

## 2014-05-02 ENCOUNTER — Other Ambulatory Visit: Payer: Self-pay | Admitting: Internal Medicine

## 2014-05-07 ENCOUNTER — Other Ambulatory Visit: Payer: Self-pay | Admitting: Internal Medicine

## 2014-05-10 ENCOUNTER — Other Ambulatory Visit: Payer: Self-pay | Admitting: Internal Medicine

## 2014-05-16 ENCOUNTER — Ambulatory Visit (INDEPENDENT_AMBULATORY_CARE_PROVIDER_SITE_OTHER): Payer: BC Managed Care – PPO

## 2014-05-16 ENCOUNTER — Ambulatory Visit (INDEPENDENT_AMBULATORY_CARE_PROVIDER_SITE_OTHER): Payer: BC Managed Care – PPO | Admitting: Internal Medicine

## 2014-05-16 ENCOUNTER — Encounter: Payer: Self-pay | Admitting: Internal Medicine

## 2014-05-16 VITALS — BP 122/76 | HR 88 | Temp 97.3°F | Wt 268.5 lb

## 2014-05-16 DIAGNOSIS — E785 Hyperlipidemia, unspecified: Secondary | ICD-10-CM

## 2014-05-16 DIAGNOSIS — F329 Major depressive disorder, single episode, unspecified: Secondary | ICD-10-CM

## 2014-05-16 DIAGNOSIS — I10 Essential (primary) hypertension: Secondary | ICD-10-CM

## 2014-05-16 DIAGNOSIS — E119 Type 2 diabetes mellitus without complications: Secondary | ICD-10-CM

## 2014-05-16 DIAGNOSIS — Z23 Encounter for immunization: Secondary | ICD-10-CM

## 2014-05-16 DIAGNOSIS — F418 Other specified anxiety disorders: Secondary | ICD-10-CM

## 2014-05-16 DIAGNOSIS — F419 Anxiety disorder, unspecified: Secondary | ICD-10-CM

## 2014-05-16 DIAGNOSIS — F32A Depression, unspecified: Secondary | ICD-10-CM

## 2014-05-16 DIAGNOSIS — M170 Bilateral primary osteoarthritis of knee: Secondary | ICD-10-CM

## 2014-05-16 LAB — BASIC METABOLIC PANEL
BUN: 20 mg/dL (ref 6–23)
CALCIUM: 9.2 mg/dL (ref 8.4–10.5)
CO2: 26 meq/L (ref 19–32)
CREATININE: 1.3 mg/dL (ref 0.4–1.5)
Chloride: 100 mEq/L (ref 96–112)
GFR: 62.99 mL/min (ref >60.00)
Glucose, Bld: 189 mg/dL — ABNORMAL HIGH (ref 70–99)
Potassium: 4.2 mEq/L (ref 3.5–5.1)
Sodium: 136 mEq/L (ref 135–145)

## 2014-05-16 LAB — ALT: ALT: 52 U/L (ref 0–53)

## 2014-05-16 LAB — HEMOGLOBIN A1C: HEMOGLOBIN A1C: 7.6 % — AB (ref 4.6–6.5)

## 2014-05-16 LAB — LIPID PANEL
CHOLESTEROL: 118 mg/dL (ref 0–200)
HDL: 29.4 mg/dL — ABNORMAL LOW (ref >39.00)
NonHDL: 88.6
Total CHOL/HDL Ratio: 4
Triglycerides: 294 mg/dL — ABNORMAL HIGH (ref 0.0–149.0)
VLDL: 58.8 mg/dL — AB (ref 0.0–40.0)

## 2014-05-16 LAB — TSH: TSH: 1.07 u[IU]/mL (ref 0.35–4.50)

## 2014-05-16 LAB — LDL CHOLESTEROL, DIRECT: Direct LDL: 44.9 mg/dL

## 2014-05-16 LAB — AST: AST: 36 U/L (ref 0–37)

## 2014-05-16 MED ORDER — ACCU-CHEK SOFT TOUCH LANCETS MISC: Status: DC

## 2014-05-16 MED ORDER — ACCU-CHEK COMPACT PLUS CARE KIT: PACK | Status: DC
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
## 2014-05-16 NOTE — Assessment & Plan Note (Signed)
Anxiety depression Symptoms under excellent control, likes to decrease clonazepam and continue with current dose of Lexapro. We agreed to decrease clonazepam gradually to half twice a day, then half daily but okay to increase dose as needed

## 2014-05-16 NOTE — Assessment & Plan Note (Signed)
After knee replacements, started having pain with the left foot, dropped arch and  numbness, has been very bothersome, under the care of Dr. Doran Durand orthopedic surgery.

## 2014-05-16 NOTE — Assessment & Plan Note (Addendum)
Diabetes, CBGs not as good as before, unable to exercise due to DJD, diet not as good as before. We discussed diet, exercise Check A1c Patient self increased glipizide to 1 tablet twice a day Continue with Janumet

## 2014-05-16 NOTE — Progress Notes (Signed)
Pre visit review using our clinic review tool, if applicable. No additional management support is needed unless otherwise documented below in the visit note. 

## 2014-05-16 NOTE — Patient Instructions (Addendum)
Get your blood work before you leave    Okay to decrease clonazepam dose as needed   Watch your diet and stay active !     Please come back to the office in 3 months  for a physical exam.

## 2014-05-27 ENCOUNTER — Other Ambulatory Visit: Payer: Self-pay

## 2014-05-27 ENCOUNTER — Telehealth: Payer: Self-pay | Admitting: Internal Medicine

## 2014-05-27 DIAGNOSIS — E119 Type 2 diabetes mellitus without complications: Secondary | ICD-10-CM

## 2014-05-27 MED ORDER — GLUCOSE BLOOD VI STRP: ORAL_STRIP | Status: DC

## 2014-05-27 MED ORDER — ACCU-CHEK COMPACT PLUS CARE KIT: PACK | Status: DC

## 2014-05-27 MED ORDER — GLIPIZIDE 10 MG PO TABS: 10.0000 mg | ORAL_TABLET | Freq: Two times a day (BID) | ORAL | Status: DC

## 2014-05-27 MED ORDER — ACCU-CHEK SOFT TOUCH LANCETS MISC: Status: DC

## 2014-05-27 NOTE — Telephone Encounter (Signed)
Glipizide, glucose meter, and lancets sent to [facility].

## 2014-05-27 NOTE — Telephone Encounter (Signed)
Caller name: bowen Relation to pt: self  Call back number: [phone] Pharmacy: [facility]  Reason for call:   Patient states that the pharmacy did not receive the rx for the lancets, glucose monitor, and updated glipizide rx. Please resend

## 2014-07-03 ENCOUNTER — Emergency Department (HOSPITAL_COMMUNITY)
Admission: EM | Admit: 2014-07-03 | Discharge: 2014-07-03 | Disposition: A | Discharge status: Home / Self Care | Payer: BC Managed Care – PPO | Attending: Emergency Medicine | Admitting: Emergency Medicine

## 2014-07-03 ENCOUNTER — Encounter (HOSPITAL_COMMUNITY): Payer: Self-pay | Admitting: Emergency Medicine

## 2014-07-03 ENCOUNTER — Emergency Department (HOSPITAL_COMMUNITY): Payer: BC Managed Care – PPO

## 2014-07-03 DIAGNOSIS — F329 Major depressive disorder, single episode, unspecified: Secondary | ICD-10-CM | POA: Insufficient documentation

## 2014-07-03 DIAGNOSIS — G4733 Obstructive sleep apnea (adult) (pediatric): Secondary | ICD-10-CM | POA: Diagnosis not present

## 2014-07-03 DIAGNOSIS — Z87891 Personal history of nicotine dependence: Secondary | ICD-10-CM | POA: Diagnosis not present

## 2014-07-03 DIAGNOSIS — F419 Anxiety disorder, unspecified: Secondary | ICD-10-CM | POA: Diagnosis not present

## 2014-07-03 DIAGNOSIS — N400 Enlarged prostate without lower urinary tract symptoms: Secondary | ICD-10-CM | POA: Diagnosis not present

## 2014-07-03 DIAGNOSIS — S99922A Unspecified injury of left foot, initial encounter: Secondary | ICD-10-CM | POA: Diagnosis present

## 2014-07-03 DIAGNOSIS — E119 Type 2 diabetes mellitus without complications: Secondary | ICD-10-CM | POA: Diagnosis not present

## 2014-07-03 DIAGNOSIS — I10 Essential (primary) hypertension: Secondary | ICD-10-CM | POA: Insufficient documentation

## 2014-07-03 DIAGNOSIS — W208XXA Other cause of strike by thrown, projected or falling object, initial encounter: Secondary | ICD-10-CM | POA: Insufficient documentation

## 2014-07-03 DIAGNOSIS — E785 Hyperlipidemia, unspecified: Secondary | ICD-10-CM | POA: Diagnosis not present

## 2014-07-03 DIAGNOSIS — M1990 Unspecified osteoarthritis, unspecified site: Secondary | ICD-10-CM | POA: Insufficient documentation

## 2014-07-03 DIAGNOSIS — Y92009 Unspecified place in unspecified non-institutional (private) residence as the place of occurrence of the external cause: Secondary | ICD-10-CM | POA: Diagnosis not present

## 2014-07-03 DIAGNOSIS — S9032XA Contusion of left foot, initial encounter: Secondary | ICD-10-CM | POA: Diagnosis not present

## 2014-07-03 DIAGNOSIS — Y998 Other external cause status: Secondary | ICD-10-CM | POA: Insufficient documentation

## 2014-07-03 DIAGNOSIS — Z79899 Other long term (current) drug therapy: Secondary | ICD-10-CM | POA: Insufficient documentation

## 2014-07-03 DIAGNOSIS — Z87442 Personal history of urinary calculi: Secondary | ICD-10-CM | POA: Diagnosis not present

## 2014-07-03 DIAGNOSIS — Y9389 Activity, other specified: Secondary | ICD-10-CM | POA: Insufficient documentation

## 2014-07-03 MED ORDER — TRAMADOL HCL 50 MG PO TABS: 50.0000 mg | ORAL_TABLET | Freq: Four times a day (QID) | ORAL | Status: DC | PRN

## 2014-07-03 MED ORDER — TRAMADOL HCL 50 MG PO TABS
50.0000 mg | ORAL_TABLET | Freq: Once | ORAL | Status: AC
  Administered 2014-07-03: 50 mg via ORAL
  Filled 2014-07-03: qty 1

## 2014-07-03 MED ORDER — OXYCODONE-ACETAMINOPHEN 5-325 MG PO TABS
1.0000 | ORAL_TABLET | Freq: Once | ORAL | Status: DC
Start: 2014-07-03 — End: 2014-07-03

## 2014-07-03 MED ORDER — IBUPROFEN 800 MG PO TABS: 800.0000 mg | ORAL_TABLET | Freq: Three times a day (TID) | ORAL | Status: DC

## 2014-07-03 NOTE — ED Notes (Signed)
Declined W/C at D/C and was escorted to lobby by RN. 

## 2014-07-03 NOTE — Discharge Instructions (Signed)
Call for a follow up appointment with an orthopedic specialist needed. Elevate your foot above your heart to reduce swelling, apply ice 3-4 times a day to reduce swelling and pain. Wear postop shoe as needed. Return if Symptoms worsen.   Take medication as prescribed.

## 2014-07-03 NOTE — ED Notes (Signed)
Pt was at home when his TV fell on his left foot.  Injury caused by the TV that tilted over and fell off of a dolley on to his foot.  Pt placed ice on the injured area, and took 800mg  Ibuprofen before coming in today.
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
## 2014-07-10 ENCOUNTER — Other Ambulatory Visit: Payer: Self-pay | Admitting: Internal Medicine

## 2014-07-16 ENCOUNTER — Telehealth: Payer: Self-pay

## 2014-07-16 MED ORDER — CLONAZEPAM 0.5 MG PO TABS: 0.5000 mg | ORAL_TABLET | Freq: Three times a day (TID) | ORAL | Status: DC | PRN

## 2014-07-16 NOTE — Telephone Encounter (Signed)
Faxed to [facility].

## 2014-07-16 NOTE — Telephone Encounter (Signed)
Pt is requesting refill on Clonazepam.  Last OV: 05/16/2014 Last Fill: 04/05/2014 # [id number] UDS: 11/07/2013 Low risk  Please advise.

## 2014-07-16 NOTE — Telephone Encounter (Signed)
done

## 2014-07-31 ENCOUNTER — Other Ambulatory Visit: Payer: Self-pay | Admitting: Internal Medicine

## 2014-08-22 ENCOUNTER — Encounter: Payer: BC Managed Care – PPO | Admitting: Internal Medicine

## 2014-10-09 ENCOUNTER — Other Ambulatory Visit: Payer: Self-pay | Admitting: Internal Medicine

## 2014-10-21 ENCOUNTER — Ambulatory Visit (INDEPENDENT_AMBULATORY_CARE_PROVIDER_SITE_OTHER): Payer: BC Managed Care – PPO | Admitting: Pulmonary Disease

## 2014-10-21 ENCOUNTER — Encounter: Payer: Self-pay | Admitting: Pulmonary Disease

## 2014-10-21 VITALS — BP 138/82 | HR 76 | Ht 72.0 in | Wt 273.4 lb

## 2014-10-21 DIAGNOSIS — G4733 Obstructive sleep apnea (adult) (pediatric): Secondary | ICD-10-CM | POA: Diagnosis not present

## 2014-10-21 NOTE — Patient Instructions (Signed)
Events are controlled on 8 cm CPAP supplies will be renewed Nasonex Rx each nare 1 spray #5 refills

## 2014-10-22 NOTE — Assessment & Plan Note (Signed)
Events are controlled on 8 cm CPAP supplies will be renewed Nasonex Rx each nare 1 spray #5 refills  Weight loss encouraged, compliance with goal of at least 4-6 hrs every night is the expectation. Advised against medications with sedative side effects Cautioned against driving when sleepy - understanding that sleepiness will vary on a day to day basis

## 2014-10-22 NOTE — Progress Notes (Signed)
   Subjective:    Patient ID: Leonard [name], male    DOB: 1955/10/26, 59 y.o.   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 The sleep efficiency of 65%. Although the oral AHI was 8 per hour, but REM related AHI was 31.8. The lowest desaturation was 87%.  August, 2011 Had good results with autoCPAP- Changed to 8 cm based on download   02/2011 Download on 8 cm shows no residual events, AHI 1.1/h, leak +, good compliance   Download 02/2012 >> no residuals 1/h on cpap 8 cm, good usage, leak ok  10/21/14  Chief Complaint  Patient presents with  . Sleep Apnea    Wears CPAP every night; no concerns.   Last seen 2013-he did not tolerate increase in pressure to 10 cm He is planning to retire in 2 years Claims good compliance, no snoring per wife, feels rested in the daytime, denies Download on 8 cm 09/2014 >> AHI to per hour, leak not significant, good usage more than 8 hours He complains of nasal congestion during allergy season  Review of Systems neg for any significant sore throat, dysphagia, itching, sneezing, nasal congestion or excess/ purulent secretions, fever, chills, sweats, unintended wt loss, pleuritic or exertional cp, hempoptysis, orthopnea pnd or change in chronic leg swelling. Also denies presyncope, palpitations, heartburn, abdominal pain, nausea, vomiting, diarrhea or change in bowel or urinary habits, dysuria,hematuria, rash, arthralgias, visual complaints, headache, numbness weakness or ataxia.      Objective:   Physical Exam  Gen. Pleasant, obese, in no distress ENT - no lesions, no post nasal drip Neck: No JVD, no thyromegaly, no carotid bruits Lungs: no use of accessory muscles, no dullness to percussion, decreased without rales or rhonchi  Cardiovascular: Rhythm regular, heart sounds  normal, no  murmurs or gallops, no peripheral edema Musculoskeletal: No deformities, no cyanosis or clubbing , no tremors       Assessment & Plan:

## 2014-10-29 ENCOUNTER — Other Ambulatory Visit: Payer: Self-pay | Admitting: Internal Medicine

## 2014-11-12 ENCOUNTER — Encounter: Payer: Self-pay | Admitting: Pulmonary Disease

## 2014-11-21 ENCOUNTER — Telehealth: Payer: Self-pay | Admitting: Pulmonary Disease

## 2014-11-21 MED ORDER — BECLOMETHASONE DIPROPIONATE 80 MCG/ACT NA AERS: 1.0000 | INHALATION_SPRAY | Freq: Every day | NASAL | Status: DC

## 2014-11-21 NOTE — Telephone Encounter (Signed)
Qnasl refilled.  Nothing further needed.

## 2014-11-26 ENCOUNTER — Telehealth: Payer: Self-pay | Admitting: *Deleted

## 2014-11-26 NOTE — Telephone Encounter (Signed)
Prior authorization for Qnasl approved. JG//CMA

## 2014-11-27 ENCOUNTER — Telehealth: Payer: Self-pay | Admitting: Internal Medicine

## 2014-11-27 NOTE — Telephone Encounter (Signed)
Pre Visit letter sent  °

## 2014-11-29 ENCOUNTER — Other Ambulatory Visit: Payer: Self-pay | Admitting: Internal Medicine

## 2014-12-04 ENCOUNTER — Other Ambulatory Visit (HOSPITAL_COMMUNITY): Payer: Self-pay | Admitting: Orthopedic Surgery

## 2014-12-10 NOTE — Pre-Procedure Instructions (Addendum)
Leonard [name] [facility]  12/10/2014      [facility] - [place], [place] [address] [place] [address] Phone: [phone] Fax: [phone]    Your procedure is scheduled on *12/25/14  Report to [facility] short stay admitting at 840 A.M.  Call this number if you have problems the morning of surgery:  [phone]   Remember:  Do not eat food or drink liquids after midnight.  Take these medicines the morning of surgery with A SIP OF WATER allopurinol, lexapro, clonazepam and pain med if needed     STOP all herbel meds, nsaids (aleve,naproxen,advil,ibuprofen) 5 days prior to surgery starting 12/20/14 including aspirin, melatonin, fish oil, all vitamins,cinnamon    Diabetic meds and insulin per instruction: DO NOT TAKE ANY ORAL DIABETES MEDICATION THE MORNING OF SURGERY.   Do not wear jewelry - no rings or watches.  Do not wear lotions or colognes.  You may NOT wear deodorant the day of surgery.             Men may shave face and neck.  Do not bring valuables to the hospital.  [facility] is not responsible for any belongings or valuables.  Contacts, dentures or bridgework may not be worn into surgery.  Leave your suitcase in the car.  After surgery it may be brought to your room.  For patients admitted to the hospital, discharge time will be determined by your treatment team.  Name and phone number of your driver:                                                                                                                                                                                         Special Instructions: Leonard [name] - Preparing for Surgery  Before surgery, you can play an important role.  Because skin is not sterile, your skin needs to be as free of germs as possible.  You can reduce the number of germs on you skin by washing with CHG (chlorahexidine gluconate) soap before surgery.  CHG is an antiseptic cleaner  which kills germs and bonds with the skin to continue killing germs even after washing.  Please DO NOT use if you have an allergy to CHG or antibacterial soaps.  If your skin becomes reddened/irritated stop using the CHG and inform your nurse when you arrive at Short Stay.  Do not shave (including legs and underarms) for at least 48 hours prior to the first CHG shower.  You may shave your face.  Please follow these instructions carefully:   1.  Shower with CHG  Soap the night before surgery and the morning of Surgery.  2.  If you choose to wash your hair, wash your hair first as usual with your normal shampoo.  3.  After you shampoo, rinse your hair and body thoroughly to remove the Shampoo.  4.  Use CHG as you would any other liquid soap.  You can apply chg directly  to the skin and wash gently with scrungie or a clean washcloth.  5.  Apply the CHG Soap to your body ONLY FROM THE NECK DOWN.  Do not use on open wounds or open sores.  Avoid contact with your eyes ears, mouth and genitals (private parts).  Wash genitals (private parts)       with your normal soap.  6.  Wash thoroughly, paying special attention to the area where your surgery will be performed.  7.  Thoroughly rinse your body with warm water from the neck down.  8.  DO NOT shower/wash with your normal soap after using and rinsing off the CHG Soap.  9.  Pat yourself dry with a clean towel.            10.  Wear clean pajamas.            11.  Place clean sheets on your bed the night of your first shower and do not sleep with pets.  Day of Surgery  Do not apply any lotions/deodorants the morning of surgery.  Please wear clean clothes to the hospital/surgery center.  Please read over the following fact sheets that you were given. MRSA Information and Surgical Site Infection Prevention

## 2014-12-11 ENCOUNTER — Encounter (HOSPITAL_COMMUNITY): Payer: Self-pay

## 2014-12-11 ENCOUNTER — Encounter (HOSPITAL_COMMUNITY)
Admission: RE | Admit: 2014-12-11 | Discharge: 2014-12-11 | Disposition: A | Discharge status: Home / Self Care | Payer: BC Managed Care – PPO | Source: Ambulatory Visit | Attending: Orthopedic Surgery | Admitting: Orthopedic Surgery

## 2014-12-11 ENCOUNTER — Ambulatory Visit (HOSPITAL_COMMUNITY)
Admission: RE | Admit: 2014-12-11 | Discharge: 2014-12-11 | Disposition: A | Discharge status: Home / Self Care | Payer: BC Managed Care – PPO | Source: Ambulatory Visit | Attending: Orthopedic Surgery | Admitting: Orthopedic Surgery

## 2014-12-11 DIAGNOSIS — E785 Hyperlipidemia, unspecified: Secondary | ICD-10-CM | POA: Diagnosis not present

## 2014-12-11 DIAGNOSIS — M67874 Other specified disorders of tendon, left ankle and foot: Secondary | ICD-10-CM | POA: Insufficient documentation

## 2014-12-11 DIAGNOSIS — G4733 Obstructive sleep apnea (adult) (pediatric): Secondary | ICD-10-CM | POA: Diagnosis not present

## 2014-12-11 DIAGNOSIS — Z01818 Encounter for other preprocedural examination: Secondary | ICD-10-CM | POA: Insufficient documentation

## 2014-12-11 DIAGNOSIS — Z87891 Personal history of nicotine dependence: Secondary | ICD-10-CM | POA: Insufficient documentation

## 2014-12-11 DIAGNOSIS — Z79899 Other long term (current) drug therapy: Secondary | ICD-10-CM | POA: Diagnosis not present

## 2014-12-11 DIAGNOSIS — E119 Type 2 diabetes mellitus without complications: Secondary | ICD-10-CM | POA: Insufficient documentation

## 2014-12-11 DIAGNOSIS — Z01812 Encounter for preprocedural laboratory examination: Secondary | ICD-10-CM | POA: Diagnosis not present

## 2014-12-11 DIAGNOSIS — I10 Essential (primary) hypertension: Secondary | ICD-10-CM | POA: Diagnosis not present

## 2014-12-11 DIAGNOSIS — M76829 Posterior tibial tendinitis, unspecified leg: Secondary | ICD-10-CM

## 2014-12-11 HISTORY — DX: Other intervertebral disc displacement, lumbar region: M51.26

## 2014-12-11 HISTORY — DX: Other intervertebral disc degeneration, lumbar region without mention of lumbar back pain or lower extremity pain: M51.369

## 2014-12-11 HISTORY — DX: Other intervertebral disc degeneration, lumbar region: M51.36

## 2014-12-11 LAB — GLUCOSE, CAPILLARY: Glucose-Capillary: 179 mg/dL — ABNORMAL HIGH (ref 65–99)

## 2014-12-11 LAB — CBC
HEMATOCRIT: 41.5 % (ref 39.0–52.0)
Hemoglobin: 14.4 g/dL (ref 13.0–17.0)
MCH: 29.8 pg (ref 26.0–34.0)
MCHC: 34.7 g/dL (ref 30.0–36.0)
MCV: 85.7 fL (ref 78.0–100.0)
Platelets: 253 10*3/uL (ref 150–400)
RBC: 4.84 MIL/uL (ref 4.22–5.81)
RDW: 13.3 % (ref 11.5–15.5)
WBC: 7.9 10*3/uL (ref 4.0–10.5)

## 2014-12-11 LAB — COMPREHENSIVE METABOLIC PANEL
ALBUMIN: 4.1 g/dL (ref 3.5–5.0)
ALK PHOS: 57 U/L (ref 38–126)
ALT: 36 U/L (ref 17–63)
AST: 23 U/L (ref 15–41)
Anion gap: 8 (ref 5–15)
BILIRUBIN TOTAL: 0.7 mg/dL (ref 0.3–1.2)
BUN: 22 mg/dL — ABNORMAL HIGH (ref 6–20)
CHLORIDE: 108 mmol/L (ref 101–111)
CO2: 24 mmol/L (ref 22–32)
Calcium: 9.1 mg/dL (ref 8.9–10.3)
Creatinine, Ser: 1.13 mg/dL (ref 0.61–1.24)
GFR calc Af Amer: >60 mL/min (ref >60)
GFR calc non Af Amer: >60 mL/min (ref >60)
Glucose, Bld: 197 mg/dL — ABNORMAL HIGH (ref 65–99)
POTASSIUM: 4.3 mmol/L (ref 3.5–5.1)
SODIUM: 140 mmol/L (ref 135–145)
Total Protein: 6.8 g/dL (ref 6.5–8.1)

## 2014-12-11 LAB — APTT: APTT: 28 s (ref 24–37)

## 2014-12-11 LAB — SURGICAL PCR SCREEN
MRSA, PCR: NEGATIVE
Staphylococcus aureus: POSITIVE — AB

## 2014-12-11 LAB — PROTIME-INR
INR: 1.13 (ref 0.00–1.49)
Prothrombin Time: 14.7 seconds (ref 11.6–15.2)

## 2014-12-11 NOTE — Progress Notes (Signed)
Anesthesia Chart Review:  Pt is 59 year old male scheduled for L subtalar and talonavicular fusion on 12/25/2014 with Dr. Sharol Given.   PCP is Dr. Larose Kells. Pulmonologist for OSA is Dr. Elsworth Soho.   PMH includes: HTN, OSA (uses CPAP), renal artery stenosis, DM, hyperlipidemia. Former smoker. BMI 36.5. S/p B TKA 11/01/12.   Medications include: amlodipine-benazepril, lipitor, glipizide, hctz, sitagliptin/metformin.   Preoperative labs reviewed.    Chest x-ray 12/11/2014 reviewed. No active cardiopulmonary disease.   EKG 12/11/2014: NSR.   Nuclear stress test 03/31/2010: -Exercise Capacity: Excellent exercise capacity. -BP Response: Hypertensive blood pressure response. -Clinical Symptoms: No chest pain -ECG Impression: Significant ST abnormalities consistent with ischemia. -Overall Impression: Normal stress nuclear study. -Overall Impression Comments: Mild apical thinning. No ischemia.   If no changes, I anticipate pt can proceed with surgery as scheduled.   Willeen Cass, FNP-BC [facility] Short Stay Surgical Center/Anesthesiology Phone: [phone] 12/11/2014 2:23 PM

## 2014-12-11 NOTE — Progress Notes (Addendum)
PAT appt blood sugar was 173.   Dr. Elsworth Soho is his pulmonary physician and handles his CPAP.  Sleep study was done in 2008 Dr. Kathlene November is PCP Dr Percival Spanish is cardio - pt went there originally for HTN, and has not seen him in a yr, but has had stress (normal) around 5 yrs ago.  Denies any cardiac issues now.  Echo was in 2011

## 2014-12-12 LAB — HEMOGLOBIN A1C
Hgb A1c MFr Bld: 7.3 % — ABNORMAL HIGH (ref 4.8–5.6)
MEAN PLASMA GLUCOSE: 163 mg/dL

## 2014-12-18 ENCOUNTER — Ambulatory Visit (INDEPENDENT_AMBULATORY_CARE_PROVIDER_SITE_OTHER): Payer: BC Managed Care – PPO | Admitting: Internal Medicine

## 2014-12-18 ENCOUNTER — Encounter: Payer: Self-pay | Admitting: Internal Medicine

## 2014-12-18 VITALS — BP 126/80 | HR 74 | Temp 97.7°F | Ht 72.0 in | Wt 266.2 lb

## 2014-12-18 DIAGNOSIS — Z0000 Encounter for general adult medical examination without abnormal findings: Secondary | ICD-10-CM | POA: Diagnosis not present

## 2014-12-18 DIAGNOSIS — F418 Other specified anxiety disorders: Secondary | ICD-10-CM | POA: Diagnosis not present

## 2014-12-18 DIAGNOSIS — I10 Essential (primary) hypertension: Secondary | ICD-10-CM

## 2014-12-18 DIAGNOSIS — F419 Anxiety disorder, unspecified: Secondary | ICD-10-CM

## 2014-12-18 DIAGNOSIS — F32A Depression, unspecified: Secondary | ICD-10-CM

## 2014-12-18 DIAGNOSIS — E785 Hyperlipidemia, unspecified: Secondary | ICD-10-CM | POA: Diagnosis not present

## 2014-12-18 DIAGNOSIS — F329 Major depressive disorder, single episode, unspecified: Secondary | ICD-10-CM

## 2014-12-18 DIAGNOSIS — E119 Type 2 diabetes mellitus without complications: Secondary | ICD-10-CM | POA: Diagnosis not present

## 2014-12-18 LAB — CHOLESTEROL, TOTAL: CHOLESTEROL: 125 mg/dL (ref 0–200)

## 2014-12-18 LAB — HM DIABETES FOOT EXAM

## 2014-12-18 MED ORDER — SITAGLIPTIN PHOS-METFORMIN HCL 50-500 MG PO TABS: 1.0000 | ORAL_TABLET | Freq: Two times a day (BID) | ORAL | Status: DC

## 2014-12-18 MED ORDER — GLIPIZIDE 10 MG PO TABS: 10.0000 mg | ORAL_TABLET | Freq: Two times a day (BID) | ORAL | Status: DC

## 2014-12-18 MED ORDER — ATORVASTATIN CALCIUM 20 MG PO TABS: 20.0000 mg | ORAL_TABLET | Freq: Every day | ORAL | Status: DC

## 2014-12-18 MED ORDER — ESCITALOPRAM OXALATE 10 MG PO TABS: 20.0000 mg | ORAL_TABLET | Freq: Every day | ORAL | Status: DC

## 2014-12-18 MED ORDER — AMLODIPINE BESY-BENAZEPRIL HCL 10-40 MG PO CAPS: 1.0000 | ORAL_CAPSULE | Freq: Every day | ORAL | Status: DC

## 2014-12-18 NOTE — Assessment & Plan Note (Signed)
Continue with problems with his left ankle, to have surgery by Dr. Sharol Given

## 2014-12-18 NOTE — Patient Instructions (Addendum)
Go to the lab  Next visit in 3 months   Diabetes and Foot Care Diabetes may cause you to have problems because of poor blood supply (circulation) to your feet and legs. This may cause the skin on your feet to become thinner, break easier, and heal more slowly. Your skin may become dry, and the skin may peel and crack. You may also have nerve damage in your legs and feet causing decreased feeling in them. You may not notice minor injuries to your feet that could lead to infections or more serious problems. Taking care of your feet is one of the most important things you can do for yourself.  HOME CARE INSTRUCTIONS  Wear shoes at all times, even in the house. Do not go barefoot. Bare feet are easily injured.  Check your feet daily for blisters, cuts, and redness. If you cannot see the bottom of your feet, use a mirror or ask someone for help.  Wash your feet with warm water (do not use hot water) and mild soap. Then pat your feet and the areas between your toes until they are completely dry. Do not soak your feet as this can dry your skin.  Apply a moisturizing lotion or petroleum jelly (that does not contain alcohol and is unscented) to the skin on your feet and to dry, brittle toenails. Do not apply lotion between your toes.  Trim your toenails straight across. Do not dig under them or around the cuticle. File the edges of your nails with an emery board or nail file.  Do not cut corns or calluses or try to remove them with medicine.  Wear clean socks or stockings every day. Make sure they are not too tight. Do not wear knee-high stockings since they may decrease blood flow to your legs.  Wear shoes that fit properly and have enough cushioning. To break in new shoes, wear them for just a few hours a day. This prevents you from injuring your feet. Always look in your shoes before you put them on to be sure there are no objects inside.  Do not cross your legs. This may decrease the blood flow  to your feet.  If you find a minor scrape, cut, or break in the skin on your feet, keep it and the skin around it clean and dry. These areas may be cleansed with mild soap and water. Do not cleanse the area with peroxide, alcohol, or iodine.  When you remove an adhesive bandage, be sure not to damage the skin around it.  If you have a wound, look at it several times a day to make sure it is healing.  Do not use heating pads or hot water bottles. They may burn your skin. If you have lost feeling in your feet or legs, you may not know it is happening until it is too late.  Make sure your health care provider performs a complete foot exam at least annually or more often if you have foot problems. Report any cuts, sores, or bruises to your health care provider immediately. SEEK MEDICAL CARE IF:   You have an injury that is not healing.  You have cuts or breaks in the skin.  You have an ingrown nail.  You notice redness on your legs or feet.  You feel burning or tingling in your legs or feet.  You have pain or cramps in your legs and feet.  Your legs or feet are numb.  Your feet always feel  cold. SEEK IMMEDIATE MEDICAL CARE IF:   There is increasing redness, swelling, or pain in or around a wound.  There is a red line that goes up your leg.  Pus is coming from a wound.  You develop a fever or as directed by your health care provider.  You notice a bad smell coming from an ulcer or wound. Document Released: 06/11/2000 Document Revised: 02/14/2013 Document Reviewed: 11/21/2012 [facility] Patient Information 2015 [place], [place]. This information is not intended to replace advice given to you by your health care provider. Make sure you discuss any questions you have with your health care provider.
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
## 2014-12-18 NOTE — Progress Notes (Signed)
Pre visit review using our clinic review tool, if applicable. No additional management support is needed unless otherwise documented below in the visit note. 

## 2014-12-18 NOTE — Assessment & Plan Note (Addendum)
Td 2014 PNM shot - 2015 prevnar-2015   zostavax -- discussed before   PSAs per urology, sees  Dr. Amalia Hailey  Last visit 2015   reports a Cscope at age 59---normal per patient ,I still don't see the records   Former smoker, quit 12 years ago, about a pack a day, option of lung cancer screening discussed

## 2014-12-19 NOTE — Assessment & Plan Note (Signed)
Diabetes, last A1c 7.3, he has been unable to exercise due to muscle skeletal problems, he is to have surgery soon. Plan: Continue the same medications. Patient has neuropathy (he thinks related to MSK problems),  educated about this condition

## 2014-12-25 ENCOUNTER — Ambulatory Visit (HOSPITAL_COMMUNITY): Payer: BC Managed Care – PPO | Admitting: Anesthesiology

## 2014-12-25 ENCOUNTER — Ambulatory Visit (HOSPITAL_COMMUNITY)
Admission: RE | Admit: 2014-12-25 | Discharge: 2014-12-26 | Disposition: A | Discharge status: Home / Self Care | Payer: BC Managed Care – PPO | Source: Ambulatory Visit | Attending: Orthopedic Surgery | Admitting: Orthopedic Surgery

## 2014-12-25 ENCOUNTER — Encounter (HOSPITAL_COMMUNITY): Payer: Self-pay | Admitting: Certified Registered Nurse Anesthetist

## 2014-12-25 ENCOUNTER — Ambulatory Visit (HOSPITAL_COMMUNITY): Payer: BC Managed Care – PPO | Admitting: Emergency Medicine

## 2014-12-25 ENCOUNTER — Encounter (HOSPITAL_COMMUNITY)
Admission: RE | Disposition: A | Discharge status: Home / Self Care | Payer: Self-pay | Source: Ambulatory Visit | Attending: Orthopedic Surgery

## 2014-12-25 DIAGNOSIS — E119 Type 2 diabetes mellitus without complications: Secondary | ICD-10-CM | POA: Insufficient documentation

## 2014-12-25 DIAGNOSIS — I739 Peripheral vascular disease, unspecified: Secondary | ICD-10-CM | POA: Insufficient documentation

## 2014-12-25 DIAGNOSIS — M21072 Valgus deformity, not elsewhere classified, left ankle: Secondary | ICD-10-CM | POA: Insufficient documentation

## 2014-12-25 DIAGNOSIS — Z87442 Personal history of urinary calculi: Secondary | ICD-10-CM | POA: Diagnosis not present

## 2014-12-25 DIAGNOSIS — Z87891 Personal history of nicotine dependence: Secondary | ICD-10-CM | POA: Insufficient documentation

## 2014-12-25 DIAGNOSIS — E785 Hyperlipidemia, unspecified: Secondary | ICD-10-CM | POA: Insufficient documentation

## 2014-12-25 DIAGNOSIS — I10 Essential (primary) hypertension: Secondary | ICD-10-CM | POA: Insufficient documentation

## 2014-12-25 DIAGNOSIS — Z7982 Long term (current) use of aspirin: Secondary | ICD-10-CM | POA: Diagnosis not present

## 2014-12-25 DIAGNOSIS — N400 Enlarged prostate without lower urinary tract symptoms: Secondary | ICD-10-CM | POA: Diagnosis not present

## 2014-12-25 DIAGNOSIS — M67874 Other specified disorders of tendon, left ankle and foot: Secondary | ICD-10-CM | POA: Insufficient documentation

## 2014-12-25 DIAGNOSIS — M1990 Unspecified osteoarthritis, unspecified site: Secondary | ICD-10-CM | POA: Diagnosis not present

## 2014-12-25 DIAGNOSIS — G4733 Obstructive sleep apnea (adult) (pediatric): Secondary | ICD-10-CM | POA: Diagnosis not present

## 2014-12-25 DIAGNOSIS — I701 Atherosclerosis of renal artery: Secondary | ICD-10-CM | POA: Insufficient documentation

## 2014-12-25 DIAGNOSIS — M76829 Posterior tibial tendinitis, unspecified leg: Secondary | ICD-10-CM | POA: Diagnosis present

## 2014-12-25 DIAGNOSIS — F329 Major depressive disorder, single episode, unspecified: Secondary | ICD-10-CM | POA: Diagnosis not present

## 2014-12-25 DIAGNOSIS — G4730 Sleep apnea, unspecified: Secondary | ICD-10-CM | POA: Insufficient documentation

## 2014-12-25 DIAGNOSIS — F419 Anxiety disorder, unspecified: Secondary | ICD-10-CM | POA: Diagnosis not present

## 2014-12-25 HISTORY — PX: ANKLE FUSION: SHX5718

## 2014-12-25 LAB — GLUCOSE, CAPILLARY
GLUCOSE-CAPILLARY: 160 mg/dL — AB (ref 65–99)
Glucose-Capillary: 164 mg/dL — ABNORMAL HIGH (ref 65–99)
Glucose-Capillary: 167 mg/dL — ABNORMAL HIGH (ref 65–99)
Glucose-Capillary: 172 mg/dL — ABNORMAL HIGH (ref 65–99)
Glucose-Capillary: 118 mg/dL — ABNORMAL HIGH (ref 65–99)

## 2014-12-25 SURGERY — ANKLE FUSION
Anesthesia: Regional | Site: Foot | Laterality: Left

## 2014-12-25 MED ORDER — FENTANYL CITRATE (PF) 100 MCG/2ML IJ SOLN
INTRAMUSCULAR | Status: DC | PRN
  Administered 2014-12-25: 25 ug via INTRAVENOUS
  Administered 2014-12-25: 100 ug via INTRAVENOUS
  Administered 2014-12-25: 25 ug via INTRAVENOUS

## 2014-12-25 MED ORDER — CLONAZEPAM 0.5 MG PO TABS: 0.5000 mg | ORAL_TABLET | Freq: Three times a day (TID) | ORAL | Status: DC | PRN

## 2014-12-25 MED ORDER — ONDANSETRON HCL 4 MG/2ML IJ SOLN
INTRAMUSCULAR | Status: AC
  Filled 2014-12-25: qty 2

## 2014-12-25 MED ORDER — MIDAZOLAM HCL 5 MG/5ML IJ SOLN
INTRAMUSCULAR | Status: DC | PRN
  Administered 2014-12-25: 2 mg via INTRAVENOUS

## 2014-12-25 MED ORDER — METHOCARBAMOL 1000 MG/10ML IJ SOLN
500.0000 mg | INTRAVENOUS | Status: AC
  Administered 2014-12-25: 500 mg via INTRAVENOUS
  Filled 2014-12-25: qty 5

## 2014-12-25 MED ORDER — DOCUSATE SODIUM 100 MG PO CAPS
100.0000 mg | ORAL_CAPSULE | Freq: Two times a day (BID) | ORAL | Status: DC
  Administered 2014-12-25 – 2014-12-26 (×3): 100 mg via ORAL
  Filled 2014-12-25 (×3): qty 1

## 2014-12-25 MED ORDER — DEXTROSE 5 % IV SOLN
500.0000 mg | Freq: Four times a day (QID) | INTRAVENOUS | Status: DC | PRN
  Filled 2014-12-25: qty 5

## 2014-12-25 MED ORDER — PROPOFOL 10 MG/ML IV BOLUS
INTRAVENOUS | Status: AC
  Filled 2014-12-25: qty 20

## 2014-12-25 MED ORDER — FLUTICASONE PROPIONATE 50 MCG/ACT NA SUSP
1.0000 | Freq: Every day | NASAL | Status: DC
  Filled 2014-12-25: qty 16

## 2014-12-25 MED ORDER — OXYCODONE HCL 5 MG PO TABS
5.0000 mg | ORAL_TABLET | ORAL | Status: DC | PRN
  Administered 2014-12-26: 10 mg via ORAL
  Filled 2014-12-25: qty 2

## 2014-12-25 MED ORDER — SITAGLIPTIN PHOS-METFORMIN HCL 50-500 MG PO TABS: 1.0000 | ORAL_TABLET | Freq: Two times a day (BID) | ORAL | Status: DC

## 2014-12-25 MED ORDER — ONDANSETRON HCL 4 MG/2ML IJ SOLN
4.0000 mg | Freq: Four times a day (QID) | INTRAMUSCULAR | Status: DC | PRN
Start: 2014-12-25 — End: 2014-12-26

## 2014-12-25 MED ORDER — GLIPIZIDE 5 MG PO TABS
10.0000 mg | ORAL_TABLET | Freq: Two times a day (BID) | ORAL | Status: DC
  Administered 2014-12-25 – 2014-12-26 (×2): 10 mg via ORAL
  Filled 2014-12-25 (×3): qty 2

## 2014-12-25 MED ORDER — SODIUM CHLORIDE 0.9 % IJ SOLN
INTRAMUSCULAR | Status: AC
  Filled 2014-12-25: qty 10

## 2014-12-25 MED ORDER — CEFAZOLIN SODIUM-DEXTROSE 2-3 GM-% IV SOLR
2.0000 g | Freq: Four times a day (QID) | INTRAVENOUS | Status: AC
  Administered 2014-12-25 – 2014-12-26 (×3): 2 g via INTRAVENOUS
  Filled 2014-12-25 (×4): qty 50

## 2014-12-25 MED ORDER — LIDOCAINE HCL (CARDIAC) 20 MG/ML IV SOLN
INTRAVENOUS | Status: DC | PRN
  Administered 2014-12-25: 100 mg via INTRAVENOUS

## 2014-12-25 MED ORDER — LIDOCAINE HCL (CARDIAC) 20 MG/ML IV SOLN
INTRAVENOUS | Status: AC
  Filled 2014-12-25: qty 5

## 2014-12-25 MED ORDER — GLYCOPYRROLATE 0.2 MG/ML IJ SOLN
INTRAMUSCULAR | Status: DC | PRN
  Administered 2014-12-25: 0.2 mg via INTRAVENOUS

## 2014-12-25 MED ORDER — METFORMIN HCL 500 MG PO TABS
500.0000 mg | ORAL_TABLET | Freq: Two times a day (BID) | ORAL | Status: DC
  Administered 2014-12-25: 500 mg via ORAL
  Filled 2014-12-25: qty 1

## 2014-12-25 MED ORDER — ATORVASTATIN CALCIUM 10 MG PO TABS
20.0000 mg | ORAL_TABLET | Freq: Every day | ORAL | Status: DC
  Administered 2014-12-25: 20 mg via ORAL
  Filled 2014-12-25: qty 2

## 2014-12-25 MED ORDER — PHENYLEPHRINE HCL 10 MG/ML IJ SOLN
INTRAMUSCULAR | Status: DC | PRN
  Administered 2014-12-25: 120 ug via INTRAVENOUS
  Administered 2014-12-25: 80 ug via INTRAVENOUS

## 2014-12-25 MED ORDER — LACTATED RINGERS IV SOLN
INTRAVENOUS | Status: DC
  Administered 2014-12-25 (×3): via INTRAVENOUS

## 2014-12-25 MED ORDER — BECLOMETHASONE DIPROPIONATE 80 MCG/ACT NA AERS: 1.0000 | INHALATION_SPRAY | Freq: Every day | NASAL | Status: DC

## 2014-12-25 MED ORDER — HYDROMORPHONE HCL 1 MG/ML IJ SOLN
1.0000 mg | INTRAMUSCULAR | Status: DC | PRN
  Administered 2014-12-25: 1 mg via INTRAVENOUS
  Filled 2014-12-25: qty 1

## 2014-12-25 MED ORDER — IBUPROFEN 200 MG PO TABS
800.0000 mg | ORAL_TABLET | Freq: Three times a day (TID) | ORAL | Status: DC
  Administered 2014-12-25 – 2014-12-26 (×3): 800 mg via ORAL
  Filled 2014-12-25 (×3): qty 4

## 2014-12-25 MED ORDER — POTASSIUM CITRATE ER 10 MEQ (1080 MG) PO TBCR
10.0000 meq | EXTENDED_RELEASE_TABLET | Freq: Two times a day (BID) | ORAL | Status: DC
  Administered 2014-12-25 – 2014-12-26 (×3): 10 meq via ORAL
  Filled 2014-12-25 (×4): qty 1

## 2014-12-25 MED ORDER — METOCLOPRAMIDE HCL 5 MG/ML IJ SOLN: 5.0000 mg | Freq: Three times a day (TID) | INTRAMUSCULAR | Status: DC | PRN

## 2014-12-25 MED ORDER — HYDROCHLOROTHIAZIDE 25 MG PO TABS
25.0000 mg | ORAL_TABLET | Freq: Every day | ORAL | Status: DC
  Administered 2014-12-26: 25 mg via ORAL
  Filled 2014-12-25: qty 1

## 2014-12-25 MED ORDER — LINAGLIPTIN 5 MG PO TABS
5.0000 mg | ORAL_TABLET | Freq: Two times a day (BID) | ORAL | Status: DC
  Administered 2014-12-25 – 2014-12-26 (×2): 5 mg via ORAL
  Filled 2014-12-25 (×2): qty 1

## 2014-12-25 MED ORDER — EPHEDRINE SULFATE 50 MG/ML IJ SOLN
INTRAMUSCULAR | Status: AC
  Filled 2014-12-25: qty 1

## 2014-12-25 MED ORDER — CHLORHEXIDINE GLUCONATE 4 % EX LIQD: 60.0000 mL | Freq: Once | CUTANEOUS | Status: DC

## 2014-12-25 MED ORDER — CEFAZOLIN SODIUM 10 G IJ SOLR
3.0000 g | INTRAMUSCULAR | Status: AC
  Administered 2014-12-25: 3 g via INTRAVENOUS
  Filled 2014-12-25: qty 3000

## 2014-12-25 MED ORDER — PROPOFOL 10 MG/ML IV BOLUS
INTRAVENOUS | Status: DC | PRN
  Administered 2014-12-25: 200 mg via INTRAVENOUS

## 2014-12-25 MED ORDER — GLYCOPYRROLATE 0.2 MG/ML IJ SOLN
INTRAMUSCULAR | Status: AC
  Filled 2014-12-25: qty 1

## 2014-12-25 MED ORDER — ACETAMINOPHEN 325 MG PO TABS: 650.0000 mg | ORAL_TABLET | Freq: Four times a day (QID) | ORAL | Status: DC | PRN

## 2014-12-25 MED ORDER — EPHEDRINE SULFATE 50 MG/ML IJ SOLN
INTRAMUSCULAR | Status: DC | PRN
  Administered 2014-12-25 (×5): 10 mg via INTRAVENOUS

## 2014-12-25 MED ORDER — ACETAMINOPHEN 650 MG RE SUPP: 650.0000 mg | Freq: Four times a day (QID) | RECTAL | Status: DC | PRN

## 2014-12-25 MED ORDER — ALLOPURINOL 100 MG PO TABS
100.0000 mg | ORAL_TABLET | Freq: Every day | ORAL | Status: DC
  Administered 2014-12-26: 100 mg via ORAL
  Filled 2014-12-25: qty 1

## 2014-12-25 MED ORDER — ONDANSETRON HCL 4 MG/2ML IJ SOLN
INTRAMUSCULAR | Status: DC | PRN
  Administered 2014-12-25: 4 mg via INTRAVENOUS

## 2014-12-25 MED ORDER — PHENYLEPHRINE 40 MCG/ML (10ML) SYRINGE FOR IV PUSH (FOR BLOOD PRESSURE SUPPORT)
PREFILLED_SYRINGE | INTRAVENOUS | Status: AC
  Filled 2014-12-25: qty 10

## 2014-12-25 MED ORDER — ASPIRIN EC 325 MG PO TBEC
325.0000 mg | DELAYED_RELEASE_TABLET | Freq: Every day | ORAL | Status: DC
  Administered 2014-12-26: 325 mg via ORAL
  Filled 2014-12-25 (×2): qty 1

## 2014-12-25 MED ORDER — INSULIN ASPART 100 UNIT/ML ~~LOC~~ SOLN
4.0000 [IU] | Freq: Three times a day (TID) | SUBCUTANEOUS | Status: DC
  Administered 2014-12-25 – 2014-12-26 (×2): 4 [IU] via SUBCUTANEOUS

## 2014-12-25 MED ORDER — FENTANYL CITRATE (PF) 250 MCG/5ML IJ SOLN
INTRAMUSCULAR | Status: AC
  Filled 2014-12-25: qty 5

## 2014-12-25 MED ORDER — MIDAZOLAM HCL 2 MG/2ML IJ SOLN
INTRAMUSCULAR | Status: AC
  Filled 2014-12-25: qty 2

## 2014-12-25 MED ORDER — METHOCARBAMOL 500 MG PO TABS: 500.0000 mg | ORAL_TABLET | Freq: Four times a day (QID) | ORAL | Status: DC | PRN

## 2014-12-25 MED ORDER — MIDAZOLAM HCL 2 MG/2ML IJ SOLN
INTRAMUSCULAR | Status: AC
  Administered 2014-12-25: 2 mg
  Filled 2014-12-25: qty 2

## 2014-12-25 MED ORDER — ESCITALOPRAM OXALATE 10 MG PO TABS
20.0000 mg | ORAL_TABLET | Freq: Every day | ORAL | Status: DC
  Administered 2014-12-25 – 2014-12-26 (×2): 20 mg via ORAL
  Filled 2014-12-25 (×2): qty 2

## 2014-12-25 MED ORDER — SODIUM CHLORIDE 0.9 % IV SOLN
INTRAVENOUS | Status: DC
  Administered 2014-12-25: 14:00:00 via INTRAVENOUS

## 2014-12-25 MED ORDER — 0.9 % SODIUM CHLORIDE (POUR BTL) OPTIME
TOPICAL | Status: DC | PRN
  Administered 2014-12-25: 1000 mL

## 2014-12-25 MED ORDER — INSULIN ASPART 100 UNIT/ML ~~LOC~~ SOLN
0.0000 [IU] | Freq: Three times a day (TID) | SUBCUTANEOUS | Status: DC
  Administered 2014-12-25 – 2014-12-26 (×3): 3 [IU] via SUBCUTANEOUS

## 2014-12-25 MED ORDER — METOCLOPRAMIDE HCL 5 MG PO TABS: 5.0000 mg | ORAL_TABLET | Freq: Three times a day (TID) | ORAL | Status: DC | PRN

## 2014-12-25 MED ORDER — FENTANYL CITRATE (PF) 100 MCG/2ML IJ SOLN
INTRAMUSCULAR | Status: AC
  Administered 2014-12-25: 50 ug
  Filled 2014-12-25: qty 2

## 2014-12-25 MED ORDER — ONDANSETRON HCL 4 MG PO TABS: 4.0000 mg | ORAL_TABLET | Freq: Four times a day (QID) | ORAL | Status: DC | PRN

## 2014-12-25 MED ORDER — TAMSULOSIN HCL 0.4 MG PO CAPS
0.4000 mg | ORAL_CAPSULE | Freq: Every day | ORAL | Status: DC
  Administered 2014-12-25: 0.4 mg via ORAL
  Filled 2014-12-25: qty 1

## 2014-12-25 SURGICAL SUPPLY — 47 items
BANDAGE ESMARK 6X9 LF (GAUZE/BANDAGES/DRESSINGS) ×1 IMPLANT
BIT DRILL CANN 3.2 (BIT) IMPLANT
BIT DRILL CANN LRG QC 5X300 (BIT) ×2 IMPLANT
BLADE SAW SGTL HD 18.5X60.5X1. (BLADE) ×3 IMPLANT
BLADE SURG 10 STRL SS (BLADE) IMPLANT
BNDG CMPR 9X6 STRL LF SNTH (GAUZE/BANDAGES/DRESSINGS) ×1
BNDG COHESIVE 4X5 TAN STRL (GAUZE/BANDAGES/DRESSINGS) ×3 IMPLANT
BNDG ESMARK 6X9 LF (GAUZE/BANDAGES/DRESSINGS) ×3
BNDG GAUZE ELAST 4 BULKY (GAUZE/BANDAGES/DRESSINGS) ×6 IMPLANT
COVER MAYO STAND STRL (DRAPES) IMPLANT
COVER SURGICAL LIGHT HANDLE (MISCELLANEOUS) ×6 IMPLANT
DRAPE OEC MINIVIEW 54X84 (DRAPES) IMPLANT
DRAPE U-SHAPE 47X51 STRL (DRAPES) ×3 IMPLANT
DRILL BIT CANN 3.2 (BIT) ×3
DRSG ADAPTIC 3X8 NADH LF (GAUZE/BANDAGES/DRESSINGS) ×3 IMPLANT
DURAPREP 26ML APPLICATOR (WOUND CARE) ×3 IMPLANT
ELECT REM PT RETURN 9FT ADLT (ELECTROSURGICAL) ×3
ELECTRODE REM PT RTRN 9FT ADLT (ELECTROSURGICAL) ×1 IMPLANT
GAUZE SPONGE 4X4 12PLY STRL (GAUZE/BANDAGES/DRESSINGS) ×3 IMPLANT
GLOVE BIOGEL PI IND STRL 9 (GLOVE) ×1 IMPLANT
GLOVE BIOGEL PI INDICATOR 9 (GLOVE) ×2
GLOVE SURG ORTHO 9.0 STRL STRW (GLOVE) ×7 IMPLANT
GLOVE SURG SS PI 6.5 STRL IVOR (GLOVE) ×6 IMPLANT
GOWN STRL REUS W/ TWL LRG LVL3 (GOWN DISPOSABLE) ×1 IMPLANT
GOWN STRL REUS W/ TWL XL LVL3 (GOWN DISPOSABLE) ×1 IMPLANT
GOWN STRL REUS W/TWL LRG LVL3 (GOWN DISPOSABLE) ×3
GOWN STRL REUS W/TWL XL LVL3 (GOWN DISPOSABLE) ×3
GUIDEWIRE NON THREAD 1.6MM (WIRE) ×4 IMPLANT
GUIDEWIRE THREADED 2.8 (WIRE) ×2 IMPLANT
KIT BASIN OR (CUSTOM PROCEDURE TRAY) ×3 IMPLANT
KIT ROOM TURNOVER OR (KITS) ×3 IMPLANT
NS IRRIG 1000ML POUR BTL (IV SOLUTION) ×3 IMPLANT
PACK ORTHO EXTREMITY (CUSTOM PROCEDURE TRAY) ×3 IMPLANT
PAD ARMBOARD 7.5X6 YLW CONV (MISCELLANEOUS) ×6 IMPLANT
PUTTY BONE DBX 5CC MIX (Putty) ×2 IMPLANT
SCREW COMP HEADLEASS 4.5X50 (Screw) ×2 IMPLANT
SCREW COMP HEADLESS 6.5X75 (Screw) ×2 IMPLANT
SCREW COMPR HDLS ST 4.5X40 (Screw) ×2 IMPLANT
SPONGE GAUZE 4X4 12PLY STER LF (GAUZE/BANDAGES/DRESSINGS) ×2 IMPLANT
SPONGE LAP 18X18 X RAY DECT (DISPOSABLE) ×3 IMPLANT
SUCTION FRAZIER TIP 10 FR DISP (SUCTIONS) ×3 IMPLANT
SUT ETHILON 2 0 PSLX (SUTURE) ×9 IMPLANT
TOWEL OR 17X24 6PK STRL BLUE (TOWEL DISPOSABLE) ×3 IMPLANT
TOWEL OR 17X26 10 PK STRL BLUE (TOWEL DISPOSABLE) ×3 IMPLANT
TUBE CONNECTING 12'X1/4 (SUCTIONS) ×1
TUBE CONNECTING 12X1/4 (SUCTIONS) ×2 IMPLANT
WATER STERILE IRR 1000ML POUR (IV SOLUTION) ×3 IMPLANT

## 2014-12-25 NOTE — Progress Notes (Signed)
Pt arrived to 4N17 via bed from PACU.  Pt welcomed and settled into the room.  No c/o pain, in no apparent distress.  Dressing to left ankle clean dry and intact.  Will continue to monitor. Cori Razor, RN

## 2014-12-25 NOTE — Op Note (Signed)
12/25/2014  12:34 PM  PATIENT:  Leonard [name]    PRE-OPERATIVE DIAGNOSIS:  Posterior Tibial Tendon Insufficiency Left Foot  POST-OPERATIVE DIAGNOSIS:  Same  PROCEDURE:  LEFT SUBTALAR AND TALONAVICULAR FUSION  SURGEON:  Newt Minion, MD  PHYSICIAN ASSISTANT:None ANESTHESIA:   General  PREOPERATIVE INDICATIONS:  Leonard [name] is a  59 y.o. male with a diagnosis of Posterior Tibial Tendon Insufficiency Left Foot who failed conservative measures and elected for surgical management.    The risks benefits and alternatives were discussed with the patient preoperatively including but not limited to the risks of infection, bleeding, nerve injury, cardiopulmonary complications, the need for revision surgery, among others, and the patient was willing to proceed.  OPERATIVE IMPLANTS: 3 cannulated screws Synthes  OPERATIVE FINDINGS: Sclerotic joints  OPERATIVE PROCEDURE: Patient is a 59 year old gentleman with posterior tibial tendon insufficiency is failed conservative care and presents at this time for subtalar and talonavicular fusion. Patient was brought to the operating room after undergoing a popliteal block he then underwent a general and aesthetic. After adequate levels anesthesia were obtained patient's left lower extremity was prepped using DuraPrep draped into a sterile field. A timeout was called. An oblique incision was made over the sinus Tarsi. This was carried down to the subtalar joint and the subtalar joint was debrided of articular cartilage back to bleeding viable subchondral bone with osteotomes and oscillating saws and curettes. Attention was then focused on the talonavicular joint. A dorsal incision was made between the anterior tibial tendon and EHL this was carried down to the talonavicular joint. The joint was debrided and a screw was used to stabilize the talonavicular joint after debridement. On examination patient also had some mild instability of the base of  the first metatarsal medial cuneiform joint a screw was placed across this joint to stabilize this joint as well. The wounds were irrigated normal saline the fusion sites were packed with demineralized bone matrix and cancellus chips. The incisions were closed using 2-0 nylon. A sterile compressive dressing was applied. Patient was extubated taken to the PACU in stable condition.

## 2014-12-25 NOTE — Anesthesia Postprocedure Evaluation (Signed)
Anesthesia Post Note  Patient: Leonard [name]  Procedure(s) Performed: Procedure(s) (LRB): LEFT SUBTALAR AND TALONAVICULAR FUSION (Left)  Anesthesia type: general  Patient location: PACU  Post pain: Pain level controlled  Post assessment: Patient's Cardiovascular Status Stable  Last Vitals:  Filed Vitals:   12/25/14 1359  BP: 122/74  Pulse: 86  Temp: 36.6 C  Resp: 16    Post vital signs: Reviewed and stable  Level of consciousness: sedated  Complications: No apparent anesthesia complications

## 2014-12-25 NOTE — Progress Notes (Signed)
Placed patient on CPAP at 8cm for the night. (as per home settings). Patient brought his mask and tubing from home.

## 2014-12-25 NOTE — Transfer of Care (Signed)
Immediate Anesthesia Transfer of Care Note  Patient: Leonard [name]  Procedure(s) Performed: Procedure(s): LEFT SUBTALAR AND TALONAVICULAR FUSION (Left)  Patient Location: PACU  Anesthesia Type:General  Level of Consciousness: awake, patient cooperative and responds to stimulation  Airway & Oxygen Therapy: Patient Spontanous Breathing and Patient connected to face mask oxygen  Post-op Assessment: Report given to RN, Post -op Vital signs reviewed and stable and Patient moving all extremities X 4  Post vital signs: Reviewed and stable  Last Vitals:  Filed Vitals:   12/25/14 1030  BP: 138/90  Pulse: 64  Temp:   Resp: 15    Complications: No apparent anesthesia complications

## 2014-12-25 NOTE — Progress Notes (Signed)
Orthopedic Tech Progress Note Patient Details:  Leonard [name] 23-Dec-1955 [id number]  Ortho Devices Type of Ortho Device: CAM walker, Crutches Ortho Device/Splint Location: LLE Ortho Device/Splint Interventions: Ordered, Application   Braulio Bosch 12/25/2014, 3:26 PM
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
## 2014-12-25 NOTE — Anesthesia Preprocedure Evaluation (Addendum)
Anesthesia Evaluation  Patient identified by MRN, date of birth, ID band Patient awake    Reviewed: Allergy & Precautions, NPO status , Patient's Chart, lab work & pertinent test results  Airway Mallampati: II  TM Distance: >3 FB Neck ROM: Full    Dental no notable dental hx.    Pulmonary sleep apnea , former smoker,  breath sounds clear to auscultation  Pulmonary exam normal       Cardiovascular hypertension, Pt. on medications + Peripheral Vascular Disease Normal cardiovascular examRhythm:Regular Rate:Normal     Neuro/Psych negative neurological ROS  negative psych ROS   GI/Hepatic negative GI ROS, Neg liver ROS,   Endo/Other  negative endocrine ROSdiabetes  Renal/GU Renal disease     Musculoskeletal  (+) Arthritis -,   Abdominal   Peds  Hematology negative hematology ROS (+)   Anesthesia Other Findings   Reproductive/Obstetrics negative OB ROS                             Anesthesia Physical Anesthesia Plan  ASA: III  Anesthesia Plan: General and Regional   Post-op Pain Management:    Induction: Intravenous  Airway Management Planned: LMA  Additional Equipment:   Intra-op Plan:   Post-operative Plan: Extubation in OR  Informed Consent: I have reviewed the patients History and Physical, chart, labs and discussed the procedure including the risks, benefits and alternatives for the proposed anesthesia with the patient or authorized representative who has indicated his/her understanding and acceptance.   Dental advisory given  Plan Discussed with: CRNA  Anesthesia Plan Comments:        Anesthesia Quick Evaluation

## 2014-12-26 ENCOUNTER — Encounter (HOSPITAL_COMMUNITY): Payer: Self-pay | Admitting: Orthopedic Surgery

## 2014-12-26 DIAGNOSIS — M67874 Other specified disorders of tendon, left ankle and foot: Secondary | ICD-10-CM | POA: Diagnosis not present

## 2014-12-26 LAB — GLUCOSE, CAPILLARY
Glucose-Capillary: 165 mg/dL — ABNORMAL HIGH (ref 65–99)
Glucose-Capillary: 151 mg/dL — ABNORMAL HIGH (ref 65–99)

## 2014-12-26 MED ORDER — HYDROCODONE-ACETAMINOPHEN 5-325 MG PO TABS: 1.0000 | ORAL_TABLET | Freq: Four times a day (QID) | ORAL | Status: DC | PRN

## 2014-12-26 MED ORDER — OXYCODONE-ACETAMINOPHEN 5-325 MG PO TABS: 1.0000 | ORAL_TABLET | ORAL | Status: DC | PRN

## 2014-12-26 MED ORDER — ASPIRIN EC 325 MG PO TBEC: 325.0000 mg | DELAYED_RELEASE_TABLET | Freq: Every day | ORAL | Status: DC

## 2014-12-26 NOTE — Evaluation (Addendum)
Physical Therapy Evaluation Patient Details Name: Leonard Leonard [name] MRN: [id number] DOB: 26-Oct-1955 Today's Date: 12/26/2014   History of Present Illness  Patient is a 59 yo male admitted 12/25/14 with Lt posterior tibialis tendon insufficiency.  Now s/p Lt subtalar and talonavicular fusion.  PMH:  HTN, DM, back pain, Bil TKA, anxiety, depression  Clinical Impression  Patient is at supervision - min guard assist with all mobility and gait using crutches.  Able to maintain NWB status LLE.  Able to negotiate stairs with min guard assist.  No further acute PT needs identified - PT will sign off.  Patient ready for d/c from PT perspective.    Follow Up Recommendations No PT follow up;Supervision for mobility/OOB    Equipment Recommendations  Crutches    Recommendations for Other Services       Precautions / Restrictions Precautions Precautions: None Required Braces or Orthoses: Other Brace/Splint Other Brace/Splint: Cam boot LLE Restrictions Weight Bearing Restrictions: Yes LLE Weight Bearing: Non weight bearing      Mobility  Bed Mobility Overal bed mobility: Modified Independent             General bed mobility comments: Required increased time  Transfers Overall transfer level: Needs assistance Equipment used: Crutches Transfers: Sit to/from Stand Sit to Stand: Min guard         General transfer comment: Verbal cues for safe transfers with crutches.  Assist to steady during transition.  Ambulation/Gait Ambulation/Gait assistance: Supervision Ambulation Distance (Feet): 180 Feet Assistive device: Crutches Gait Pattern/deviations:  (Hop-to) Gait velocity: Decreased Gait velocity interpretation: Below normal speed for age/gender General Gait Details: Verbal cues for safe use of crutches and NWB status LLE.   Patient required only supervision for safety.  Cues to take smaller steps to avoid "swinging" on crutches.  Stairs Stairs: Yes Stairs assistance: Min  guard Stair Management: One rail Right;With crutches;Step to pattern;Forwards Number of Stairs: 3 General stair comments: Instructed patient to negotiate stairs with 1 rail and 1 crutch using step-to pattern.  Able to perform with min guard assist for safety.  Instructed Leonard [name] on correct guarding technique.  Wheelchair Mobility    Modified Rankin (Stroke Patients Only)       Balance                                             Pertinent Vitals/Pain Pain Assessment: No/denies pain    Home Living Family/patient expects to be discharged to:: Private residence Living Arrangements: Spouse/significant other Available Help at Discharge: Family;Available 24 hours/day Type of Home: House Home Access: Stairs to enter Entrance Stairs-Rails: Right Entrance Stairs-Number of Steps: 6 Home Layout: Two level;Able to live on main level with bedroom/bathroom Home Equipment: Gilford Rile - 2 wheels;Wheelchair - manual;Bedside commode      Prior Function Level of Independence: Independent               Hand Dominance        Extremity/Trunk Assessment   Upper Extremity Assessment: Overall WFL for tasks assessed           Lower Extremity Assessment: LLE deficits/detail   LLE Deficits / Details: Able to lift LLE against gravity.  Unable to test ankle due to immobilization.  Cervical / Trunk Assessment: Normal  Communication   Communication: No difficulties  Cognition Arousal/Alertness: Awake/alert Behavior During Therapy: WFL for tasks assessed/performed Overall Cognitive Status:  Within Functional Limits for tasks assessed                      General Comments      Exercises        Assessment/Plan    PT Assessment Patent does not need any further PT services  PT Diagnosis Abnormality of gait   PT Problem List    PT Treatment Interventions     PT Goals (Current goals can be found in the Care Plan section) Acute Rehab PT Goals PT Goal  Formulation: All assessment and education complete, DC therapy    Frequency     Barriers to discharge        Co-evaluation               End of Session Equipment Utilized During Treatment: Gait belt Activity Tolerance: Patient tolerated treatment well Patient left: in bed;with call bell/phone within reach;with family/visitor present Nurse Communication: Mobility status (Ready for d/c from PT perspective)    Functional Assessment Tool Used: Clinical judgement Functional Limitation: Mobility: Walking and moving around Mobility: Walking and Moving Around Current Status (I3474): At least 1 percent but less than 20 percent impaired, limited or restricted Mobility: Walking and Moving Around Goal Status [phone]): At least 1 percent but less than 20 percent impaired, limited or restricted Mobility: Walking and Moving Around Discharge Status [phone]): At least 1 percent but less than 20 percent impaired, limited or restricted    Time: 10:55-11:06 and [id number] PT Time Calculation (min) (ACUTE ONLY): 11 min + 16 min = 27 min total   Charges:   PT Evaluation $Initial PT Evaluation Tier I: 1 Procedure PT Treatments $Gait Training: 8-22 mins   PT G Codes:   PT G-Codes **NOT FOR INPATIENT CLASS** Functional Assessment Tool Used: Clinical judgement Functional Limitation: Mobility: Walking and moving around Mobility: Walking and Moving Around Current Status (A4166): At least 1 percent but less than 20 percent impaired, limited or restricted Mobility: Walking and Moving Around Goal Status [phone]): At least 1 percent but less than 20 percent impaired, limited or restricted Mobility: Walking and Moving Around Discharge Status [phone]): At least 1 percent but less than 20 percent impaired, limited or restricted    Despina Pole 12/26/2014, 11:42 AM Carita Pian. Sanjuana Kava, [place] Pager [phone]

## 2014-12-26 NOTE — Progress Notes (Signed)
Patient ID: Leonard [name], male   DOB: September 08, 1955, 59 y.o.   MRN: [id number] Postoperative day 1 status post talonavicular fusion and subtalar fusion. Plan for discharge to home today prescriptions provided for Vicodin and Percocet and aspirin. Plan for follow-up the office in 1 week.

## 2014-12-26 NOTE — Progress Notes (Signed)
Discharge order received. Discharge instructions provided with verbalize understanding. VSS. Pt alert and oriented X4. Transported via wheelchair with spouse to home.  Docia Barrier, Therapist, sports.

## 2014-12-26 NOTE — Discharge Summary (Signed)
Physician Discharge Summary  Patient ID: Leonard [name] MRN: [id number] DOB/AGE: 59-Sep-1957 59 y.o.  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 He underwent subtalar and talonavicular fusion and was discharged to home in stable condition.  Consults: None  Significant Diagnostic Studies: labs: Routine labs  Treatments: surgery: See operative note  Discharge Exam: Blood pressure 134/76, pulse 72, temperature 98.6 F (37 C), temperature source Oral, resp. rate 16, height 6' (1.829 m), weight 120.77 kg (266 lb 4 oz), SpO2 93 %. Incision/Wound: incision clean and dry  Disposition: 01-Home or Self Care  Discharge Instructions    Call MD / Call 911    Complete by:  As directed   If you experience chest pain or shortness of breath, CALL 911 and be transported to the hospital emergency room.  If you develope a fever above 101 F, pus (white drainage) or increased drainage or redness at the wound, or calf pain, call your surgeon's office.     Constipation Prevention    Complete by:  As directed   Drink plenty of fluids.  Prune juice may be helpful.  You may use a stool softener, such as Colace (over the counter) 100 mg twice a day.  Use MiraLax (over the counter) for constipation as needed.     Diet - low sodium heart healthy    Complete by:  As directed      Increase activity slowly as tolerated    Complete by:  As directed      Non weight bearing    Complete by:  As directed             Medication List    TAKE these medications        ACCU-CHEK COMPACT CARE KIT Kit  Check Blood Sugar twice daily. DX: E11.9     accu-chek soft touch lancets  Check Blood sugar twice daily.     allopurinol  100 MG tablet  Commonly known as:  ZYLOPRIM  Take 100 mg by mouth daily before breakfast.     amLODipine-benazepril 10-40 MG per capsule  Commonly known as:  LOTREL  Take 1 capsule by mouth at bedtime.     aspirin EC 325 MG tablet  Take 1 tablet (325 mg total) by mouth daily.     atorvastatin 20 MG tablet  Commonly known as:  LIPITOR  Take 1 tablet (20 mg total) by mouth daily.     b complex vitamins tablet  Take 1 tablet by mouth daily.     Beclomethasone Dipropionate 80 MCG/ACT Aers  Place 1 spray into the nose at bedtime.     clonazePAM 0.5 MG tablet  Commonly known as:  KLONOPIN  Take 1 tablet (0.5 mg total) by mouth 3 (three) times daily as needed for anxiety.     escitalopram 10 MG tablet  Commonly known as:  LEXAPRO  Take 2 tablets (20 mg total) by mouth daily.     fish oil-omega-3 fatty acids 1000 MG capsule  Take 2 g by mouth 2 (two) times daily.     glipiZIDE 10 MG tablet  Commonly known as:  GLUCOTROL  Take 1 tablet (10 mg total) by mouth 2 (two) times daily before a meal.     glucose blood test strip  Commonly known  as:  ACCU-CHEK COMPACT PLUS  TEST BLOOD GLUCOSE TWICE DAILY AS DIRECTED     hydrochlorothiazide 25 MG tablet  Commonly known as:  HYDRODIURIL  Take 25 mg by mouth daily before breakfast.     HYDROcodone-acetaminophen 5-325 MG per tablet  Commonly known as:  NORCO  Take 1 tablet by mouth every 6 (six) hours as needed.     ibuprofen 800 MG tablet  Commonly known as:  ADVIL,MOTRIN  Take 1 tablet (800 mg total) by mouth 3 (three) times daily.     METAMUCIL PO  Take by mouth. TAKE 5 CAPSULES AT BEDTIME DAILY     multivitamin with minerals Tabs tablet  Take 1 tablet by mouth daily.     oxyCODONE-acetaminophen 5-325 MG per tablet  Commonly known as:  ROXICET  Take 1 tablet by mouth every 4 (four) hours as needed for severe pain.     potassium citrate 10 MEQ (1080 MG) SR tablet  Commonly known as:  UROCIT-K  Take 10 mEq by mouth 2 (two)  times daily.     sitaGLIPtin-metformin 50-500 MG per tablet  Commonly known as:  JANUMET  Take 1 tablet by mouth 2 (two) times daily.     tamsulosin 0.4 MG Caps capsule  Commonly known as:  FLOMAX  Take 0.4 mg by mouth at bedtime.     VITAMIN B-12 PO  Take 1 tablet by mouth daily.     vitamin C 1000 MG tablet  Take 1,000 mg by mouth daily.           Follow-up Information    Follow up with DUDA,MARCUS V, MD In 1 week.   Specialty:  Orthopedic Surgery   Contact information:   [place] [address] [phone]       Signed: Newt Minion 12/26/2014, 5:01 PM

## 2015-03-13 ENCOUNTER — Telehealth: Payer: Self-pay

## 2015-03-13 NOTE — Telephone Encounter (Signed)
Pt is requesting refill on Clonazepam.  Last OV: 12/18/2014 Last Fill: 07/16/2014 #90 3RF UDS: 11/07/2013 Low risk  Pt needs UDS and contract at next OV.  Please advise.

## 2015-03-13 NOTE — Telephone Encounter (Signed)
Okay #90 and 3  refills 

## 2015-03-14 MED ORDER — CLONAZEPAM 0.5 MG PO TABS: 0.5000 mg | ORAL_TABLET | Freq: Three times a day (TID) | ORAL | Status: DC | PRN

## 2015-03-14 NOTE — Telephone Encounter (Signed)
Rx faxed to [facility].

## 2015-03-14 NOTE — Telephone Encounter (Signed)
Rx printed, awaiting MD signature.  

## 2015-03-24 ENCOUNTER — Ambulatory Visit: Payer: BC Managed Care – PPO | Admitting: Internal Medicine

## 2015-03-31 ENCOUNTER — Other Ambulatory Visit: Payer: Self-pay | Admitting: Orthopedic Surgery

## 2015-03-31 DIAGNOSIS — M545 Low back pain: Secondary | ICD-10-CM

## 2015-04-08 ENCOUNTER — Ambulatory Visit: Payer: BC Managed Care – PPO | Admitting: Internal Medicine

## 2015-04-23 ENCOUNTER — Other Ambulatory Visit: Payer: BC Managed Care – PPO

## 2015-05-10 ENCOUNTER — Ambulatory Visit
Admission: RE | Admit: 2015-05-10 | Discharge: 2015-05-10 | Disposition: A | Discharge status: Home / Self Care | Payer: BC Managed Care – PPO | Source: Ambulatory Visit | Attending: Orthopedic Surgery | Admitting: Orthopedic Surgery

## 2015-05-10 DIAGNOSIS — M545 Low back pain: Secondary | ICD-10-CM

## 2015-06-04 ENCOUNTER — Other Ambulatory Visit: Payer: Self-pay | Admitting: Internal Medicine

## 2015-07-24 ENCOUNTER — Other Ambulatory Visit: Payer: Self-pay | Admitting: Internal Medicine

## 2015-08-11 ENCOUNTER — Encounter: Payer: Self-pay | Admitting: Internal Medicine

## 2015-08-11 ENCOUNTER — Ambulatory Visit (INDEPENDENT_AMBULATORY_CARE_PROVIDER_SITE_OTHER): Payer: BC Managed Care – PPO | Admitting: Internal Medicine

## 2015-08-11 VITALS — BP 130/78 | HR 76 | Temp 97.9°F | Ht 72.0 in | Wt 268.5 lb

## 2015-08-11 DIAGNOSIS — Z114 Encounter for screening for human immunodeficiency virus [HIV]: Secondary | ICD-10-CM

## 2015-08-11 DIAGNOSIS — Z09 Encounter for follow-up examination after completed treatment for conditions other than malignant neoplasm: Secondary | ICD-10-CM

## 2015-08-11 DIAGNOSIS — E785 Hyperlipidemia, unspecified: Secondary | ICD-10-CM | POA: Diagnosis not present

## 2015-08-11 DIAGNOSIS — I10 Essential (primary) hypertension: Secondary | ICD-10-CM | POA: Diagnosis not present

## 2015-08-11 DIAGNOSIS — F418 Other specified anxiety disorders: Secondary | ICD-10-CM | POA: Diagnosis not present

## 2015-08-11 DIAGNOSIS — F32A Depression, unspecified: Secondary | ICD-10-CM

## 2015-08-11 DIAGNOSIS — F329 Major depressive disorder, single episode, unspecified: Secondary | ICD-10-CM

## 2015-08-11 DIAGNOSIS — F419 Anxiety disorder, unspecified: Secondary | ICD-10-CM

## 2015-08-11 DIAGNOSIS — E119 Type 2 diabetes mellitus without complications: Secondary | ICD-10-CM | POA: Diagnosis not present

## 2015-08-11 DIAGNOSIS — Z23 Encounter for immunization: Secondary | ICD-10-CM

## 2015-08-11 LAB — LIPID PANEL
CHOL/HDL RATIO: 4
Cholesterol: 121 mg/dL (ref 0–200)
HDL: 32.9 mg/dL — ABNORMAL LOW (ref >39.00)
NONHDL: 88.42
Triglycerides: 210 mg/dL — ABNORMAL HIGH (ref 0.0–149.0)
VLDL: 42 mg/dL — AB (ref 0.0–40.0)

## 2015-08-11 LAB — BASIC METABOLIC PANEL
BUN: 26 mg/dL — ABNORMAL HIGH (ref 6–23)
CHLORIDE: 103 meq/L (ref 96–112)
CO2: 26 meq/L (ref 19–32)
Calcium: 9.5 mg/dL (ref 8.4–10.5)
Creatinine, Ser: 1.22 mg/dL (ref 0.40–1.50)
GFR: 64.51 mL/min (ref >60.00)
GLUCOSE: 231 mg/dL — AB (ref 70–99)
POTASSIUM: 3.8 meq/L (ref 3.5–5.1)
Sodium: 138 mEq/L (ref 135–145)

## 2015-08-11 LAB — LDL CHOLESTEROL, DIRECT: Direct LDL: 57 mg/dL

## 2015-08-11 LAB — HEMOGLOBIN A1C: HEMOGLOBIN A1C: 8 % — AB (ref 4.6–6.5)

## 2015-08-11 LAB — HIV ANTIBODY (ROUTINE TESTING W REFLEX): HIV 1&2 Ab, 4th Generation: NONREACTIVE

## 2015-08-11 NOTE — Patient Instructions (Signed)
GO TO THE LAB : Get the blood work    GO TO THE FRONT DESK Schedule a routine office visit or check up to be done in  3-4 months  No  fasting
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
## 2015-08-11 NOTE — Assessment & Plan Note (Signed)
DM: Not under optimal control, treatment limited by unable to exercise as much as he likes to and also feeling unmotivated (due to SSRIs?)  to diet.Plan-- Labs, diet and exercise encouraged. Consider increase metformin dose although he had some diarrhea w/ higher doses or add Actos. HTN: Seems well-controlled Hyperlipidemia: On statins, check FLP Anxiety depression: Symptoms well-controlled but SSRIs take his motivation away, he also suspected a long history of ADHD, not previously treated. We agree he will see a  psychiatrist, information provided, if he indeed has ADD and is treated has a good chance to decrease SSRIs and overall feel better. RTC 3-4 months 1 care-- flu shot, check HIV

## 2015-08-11 NOTE — Progress Notes (Signed)
Pre visit review using our clinic review tool, if applicable. No additional management support is needed unless otherwise documented below in the visit note. 

## 2015-08-14 MED ORDER — PIOGLITAZONE HCL 30 MG PO TABS: 30.0000 mg | ORAL_TABLET | Freq: Every day | ORAL | Status: DC

## 2015-08-14 NOTE — Addendum Note (Signed)
Addended byDamita Dunnings D on: 08/14/2015 03:54 PM   Modules accepted: Orders

## 2015-08-21 ENCOUNTER — Telehealth: Payer: Self-pay

## 2015-08-21 NOTE — Telephone Encounter (Signed)
UDS: 08/11/2015  Negative for Clonazepam: PRN Positive for Tramadol ??  Moderate risk per Dr. Larose Kells 08/21/2015

## 2015-08-25 ENCOUNTER — Other Ambulatory Visit: Payer: Self-pay | Admitting: Internal Medicine

## 2015-09-04 ENCOUNTER — Encounter: Payer: Self-pay | Admitting: Internal Medicine

## 2015-09-09 ENCOUNTER — Telehealth: Payer: Self-pay | Admitting: *Deleted

## 2015-09-09 NOTE — Telephone Encounter (Signed)
Received fax from pharmacy stating that patient's Accu-chek Drum Test Strips require prior authorization, or must use OneTouch Strips/SLS 03/14 Please Advise.

## 2015-09-10 NOTE — Telephone Encounter (Signed)
Advise patient, I am okay for him to use one touch, if he agrees , switch him to that system

## 2015-09-11 MED ORDER — GLUCOSE BLOOD VI STRP: ORAL_STRIP | Status: DC

## 2015-09-11 MED ORDER — ONETOUCH ULTRASOFT LANCETS MISC: Status: AC

## 2015-09-11 MED ORDER — ONETOUCH ULTRA SYSTEM W/DEVICE KIT: 1.0000 | PACK | Freq: Once | Status: AC

## 2015-09-11 NOTE — Telephone Encounter (Signed)
Caller name: Velna Hatchet with [facility] Can be reached: [phone] Fax: [phone]   Reason for call: Pt is out of test strips. Velna Hatchet states they faxed [place] form and they have not received it back. They will need to dispense new meter, test strips and lancets. Please send orders/form. She is refaxing the form now to [phone].

## 2015-09-11 NOTE — Telephone Encounter (Signed)
I have not personally seen form for Pt. One Touch device, and supplies electronically sent to [facility].

## 2015-09-15 MED ORDER — GLUCOSE BLOOD VI STRP: ORAL_STRIP | Status: DC

## 2015-09-15 NOTE — Addendum Note (Signed)
Addended by: Rockwell Germany on: 09/15/2015 07:35 AM   Modules accepted: Orders, Medications

## 2015-09-20 ENCOUNTER — Other Ambulatory Visit: Payer: Self-pay | Admitting: Internal Medicine

## 2015-09-30 ENCOUNTER — Other Ambulatory Visit: Payer: Self-pay | Admitting: Internal Medicine

## 2015-10-03 ENCOUNTER — Telehealth: Payer: Self-pay | Admitting: Internal Medicine

## 2015-10-03 NOTE — Telephone Encounter (Signed)
Relation to [id number] Call back number:[phone] Pharmacy: [facility], [place] [phone] (Phone) [phone] (Fax)         Reason for call:  Patient states pharmacy doesn't have any refills of glipiZIDE (GLUCOTROL) 10 MG tablet. Advised patient PCP sent 5 refills 09/22/15 patient requesting office contact pharmacy directly.

## 2015-10-06 ENCOUNTER — Telehealth: Payer: Self-pay | Admitting: Internal Medicine

## 2015-10-06 MED ORDER — CLONAZEPAM 0.5 MG PO TABS: 0.5000 mg | ORAL_TABLET | Freq: Three times a day (TID) | ORAL | Status: DC | PRN

## 2015-10-06 MED ORDER — GLIPIZIDE 10 MG PO TABS: 10.0000 mg | ORAL_TABLET | Freq: Two times a day (BID) | ORAL | Status: DC

## 2015-10-06 NOTE — Telephone Encounter (Signed)
Pt is requesting refill on Clonazepam.  Last OV: 08/11/2015 Last Fill: 03/14/2015 #90 and 3RF Pt sig: 1 tablet TID PRN UDS: 11/07/2013 Low risk  Please advise.

## 2015-10-06 NOTE — Telephone Encounter (Signed)
Okay #90 and 3  refills 

## 2015-10-06 NOTE — Telephone Encounter (Signed)
Rx resent.

## 2015-10-06 NOTE — Telephone Encounter (Signed)
Rx printed, awaiting MD signature.  

## 2015-10-06 NOTE — Telephone Encounter (Signed)
Rx faxed to [facility].

## 2015-10-06 NOTE — Telephone Encounter (Signed)
°  Relationship to patient: Pharmacy  Pharmacy:  [place], [address] [phone] (Phone) [phone] (Fax)         Reason for call: Request refill on clonazePAM (KLONOPIN) 0.5 MG tablet [id number]

## 2015-11-04 ENCOUNTER — Other Ambulatory Visit: Payer: Self-pay | Admitting: Internal Medicine

## 2015-11-04 ENCOUNTER — Other Ambulatory Visit: Payer: Self-pay

## 2015-11-12 ENCOUNTER — Ambulatory Visit (INDEPENDENT_AMBULATORY_CARE_PROVIDER_SITE_OTHER): Payer: BC Managed Care – PPO | Admitting: Pulmonary Disease

## 2015-11-12 ENCOUNTER — Other Ambulatory Visit: Payer: Self-pay | Admitting: Internal Medicine

## 2015-11-12 ENCOUNTER — Encounter: Payer: Self-pay | Admitting: Pulmonary Disease

## 2015-11-12 VITALS — BP 152/92 | HR 73 | Ht 72.0 in | Wt 271.8 lb

## 2015-11-12 DIAGNOSIS — G4733 Obstructive sleep apnea (adult) (pediatric): Secondary | ICD-10-CM | POA: Diagnosis not present

## 2015-11-12 DIAGNOSIS — J309 Allergic rhinitis, unspecified: Secondary | ICD-10-CM | POA: Diagnosis not present

## 2015-11-12 MED ORDER — FLUTICASONE PROPIONATE 50 MCG/ACT NA SUSP: 2.0000 | Freq: Every day | NASAL | Status: DC

## 2015-11-12 NOTE — Addendum Note (Signed)
Addended by: Mathis Dad on: 11/12/2015 10:12 AM   Modules accepted: Orders

## 2015-11-12 NOTE — Assessment & Plan Note (Signed)
Continue CPAP 8 cm CPAP supplies will be renewed He will qualify for a new machine  Weight loss encouraged, compliance with goal of at least 4-6 hrs every night is the expectation. Advised against medications with sedative side effects Cautioned against driving when sleepy - understanding that sleepiness will vary on a day to day basis

## 2015-11-12 NOTE — Patient Instructions (Addendum)
CPAP supplies will be renewed x 1 year Scription for Flonase nasal spray each nare at bedtime as needed

## 2015-11-12 NOTE — Assessment & Plan Note (Signed)
Rx Flonase nasal spray each nare

## 2015-11-12 NOTE — Progress Notes (Signed)
   Subjective:    Patient ID: Leonard [name], male    DOB: 1956-05-31, 60 y.o.   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 The sleep efficiency of 65%. Although the oral AHI was 8 per hour, but REM related AHI was 31.8. The lowest desaturation was 87%.  August, 2011 Had good results with autoCPAP- Changed to 8 cm based on download    11/12/2015  Chief Complaint  Patient presents with  . Follow-up    doing well on cpap, no concerns.   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  He complains of nasal congestion during allergy season -Q nasal worked well but is not covered by insurance   Review of Systems Patient denies significant dyspnea,cough, hemoptysis,  chest pain, palpitations, pedal edema, orthopnea, paroxysmal nocturnal dyspnea, lightheadedness, nausea, vomiting, abdominal or  leg pains      Objective:   Physical Exam  Gen. Pleasant, obese, in no distress ENT - no lesions, no post nasal drip Neck: No JVD, no thyromegaly, no carotid bruits Lungs: no use of accessory muscles, no dullness to percussion, decreased without rales or rhonchi  Cardiovascular: Rhythm regular, heart sounds  normal, no murmurs or gallops, no peripheral edema Musculoskeletal: No deformities, no cyanosis or clubbing , no tremors       Assessment & Plan:

## 2015-11-13 ENCOUNTER — Encounter: Payer: Self-pay | Admitting: Internal Medicine

## 2015-11-13 ENCOUNTER — Ambulatory Visit (INDEPENDENT_AMBULATORY_CARE_PROVIDER_SITE_OTHER): Payer: BC Managed Care – PPO | Admitting: Internal Medicine

## 2015-11-13 VITALS — BP 108/74 | HR 73 | Temp 98.0°F | Ht 72.0 in | Wt 263.5 lb

## 2015-11-13 DIAGNOSIS — E119 Type 2 diabetes mellitus without complications: Secondary | ICD-10-CM

## 2015-11-13 DIAGNOSIS — Z09 Encounter for follow-up examination after completed treatment for conditions other than malignant neoplasm: Secondary | ICD-10-CM

## 2015-11-13 DIAGNOSIS — I10 Essential (primary) hypertension: Secondary | ICD-10-CM | POA: Diagnosis not present

## 2015-11-13 LAB — ALT: ALT: 28 U/L (ref 0–53)

## 2015-11-13 LAB — AST: AST: 23 U/L (ref 0–37)

## 2015-11-13 LAB — HEMOGLOBIN A1C: Hgb A1c MFr Bld: 7.3 % — ABNORMAL HIGH (ref 4.6–6.5)

## 2015-11-13 MED ORDER — ATORVASTATIN CALCIUM 20 MG PO TABS: 20.0000 mg | ORAL_TABLET | Freq: Every day | ORAL | Status: DC

## 2015-11-13 NOTE — Progress Notes (Signed)
Pre visit review using our clinic review tool, if applicable. No additional management support is needed unless otherwise documented below in the visit note. 

## 2015-11-13 NOTE — Patient Instructions (Signed)
GO TO THE LAB : Get the blood work     GO TO THE FRONT DESK Schedule your next appointment for a  Physical exam in 3-4 months , fasting    Check the  blood pressure 2 or 3 times a week Be sure your blood pressure is between 110/65 and  135/85. If it is consistently higher or lower, let me know

## 2015-11-13 NOTE — Assessment & Plan Note (Signed)
DM: Rx Actos based on the last A1c, patient started to take it 2 months ago, check A1c, continue janumet  and Glucotrol. Diet has improved (thinks in part  related to Adderall) HTN: Started Adderall few weeks ago, BPs running slightly higher than usual in the 140/80, recommend to watch closely, consider add a medication ADD: Dx last month, on Adderall, feeling great. RTC CPX 3-4 months
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
## 2015-12-05 ENCOUNTER — Telehealth: Payer: Self-pay | Admitting: Internal Medicine

## 2015-12-05 ENCOUNTER — Telehealth: Payer: Self-pay | Admitting: *Deleted

## 2015-12-05 NOTE — Telephone Encounter (Signed)
Relationship to patient: self Can be reached: [phone]  Reason for call: Pt was dx with plantar fasciitis. He was recommended to take a supplement of Calcium 1000mg /magnesium 400mg /zinc 25mg  once per day. He is asking if this would be ok with the other medications that he is on and if this would be a good help to him. OK to leave VM if he cannot answer.

## 2015-12-05 NOTE — Telephone Encounter (Signed)
Please advise 

## 2015-12-05 NOTE — Telephone Encounter (Signed)
I don't see any contraindication to take it, recommend to do it for a couple of weeks and if it doesn't help then stop. Refer to sports medicine if symptoms continue

## 2015-12-05 NOTE — Telephone Encounter (Signed)
CPAP report shows good compliance and usage on 8cm.  No changes needed at this time.

## 2015-12-05 NOTE — Telephone Encounter (Signed)
Spoke w/ Pt, informed him of Dr. Larose Kells recommendations. Stated he was seen by Dr. Sharol Given at [facility] and would let him know if Sx's do not improve.

## 2015-12-09 NOTE — Telephone Encounter (Signed)
lmtcb x1 for pt. 

## 2015-12-09 NOTE — Telephone Encounter (Signed)
Pt returning call gave him notes,he seem satified.Leonard [name]

## 2015-12-22 ENCOUNTER — Encounter: Payer: Self-pay | Admitting: Pulmonary Disease

## 2016-03-05 ENCOUNTER — Other Ambulatory Visit: Payer: Self-pay | Admitting: Internal Medicine

## 2016-03-10 ENCOUNTER — Other Ambulatory Visit: Payer: Self-pay | Admitting: Internal Medicine

## 2016-04-01 ENCOUNTER — Encounter: Payer: BC Managed Care – PPO | Admitting: Internal Medicine

## 2016-04-30 ENCOUNTER — Other Ambulatory Visit: Payer: Self-pay | Admitting: Internal Medicine

## 2016-04-30 NOTE — Telephone Encounter (Signed)
Attempted to contact patient. LVMOM for return call to schedule CPE. Will refill for one month but patient needs to come back in to be evaluated before filling any other refills.TL/CMA

## 2016-05-04 ENCOUNTER — Telehealth: Payer: Self-pay | Admitting: Internal Medicine

## 2016-05-04 DIAGNOSIS — E118 Type 2 diabetes mellitus with unspecified complications: Secondary | ICD-10-CM

## 2016-05-04 NOTE — Telephone Encounter (Signed)
Patient has CPE appt scheduled for 07/10/15 at 3:00pm. Patient knows that he will need to have fasting labs for his CPE he would like to know if Dr. Larose Kells would be okay with him coming in the day before his appt for fasting labs. Please advise.

## 2016-05-04 NOTE — Telephone Encounter (Signed)
That is okay, please call 10 days in advance to set that up. Also, rec patient to do a A1c this week at his convenience, don't like to wait until his CPX to recheck his diabetes.

## 2016-05-04 NOTE — Telephone Encounter (Signed)
Please advise 

## 2016-05-05 NOTE — Telephone Encounter (Signed)
LVM making patient aware that he can get labs done before his CPE and to come in sometime soon for his A1C. I asked him to call back to schedule.

## 2016-05-06 ENCOUNTER — Other Ambulatory Visit: Payer: Self-pay | Admitting: Emergency Medicine

## 2016-05-06 MED ORDER — FLUTICASONE PROPIONATE 50 MCG/ACT NA SUSP: 2.0000 | Freq: Every day | NASAL | 2 refills | Status: DC

## 2016-05-28 ENCOUNTER — Other Ambulatory Visit: Payer: Self-pay | Admitting: Internal Medicine

## 2016-06-22 ENCOUNTER — Other Ambulatory Visit: Payer: Self-pay | Admitting: Internal Medicine

## 2016-07-09 ENCOUNTER — Encounter: Payer: Self-pay | Admitting: Internal Medicine

## 2016-07-09 ENCOUNTER — Ambulatory Visit (INDEPENDENT_AMBULATORY_CARE_PROVIDER_SITE_OTHER): Payer: BC Managed Care – PPO | Admitting: Internal Medicine

## 2016-07-09 VITALS — BP 124/84 | HR 77 | Temp 97.6°F | Resp 14 | Ht 72.0 in | Wt 277.5 lb

## 2016-07-09 DIAGNOSIS — Z23 Encounter for immunization: Secondary | ICD-10-CM | POA: Diagnosis not present

## 2016-07-09 DIAGNOSIS — Z1159 Encounter for screening for other viral diseases: Secondary | ICD-10-CM

## 2016-07-09 DIAGNOSIS — Z2911 Encounter for prophylactic immunotherapy for respiratory syncytial virus (RSV): Secondary | ICD-10-CM

## 2016-07-09 DIAGNOSIS — E119 Type 2 diabetes mellitus without complications: Secondary | ICD-10-CM

## 2016-07-09 DIAGNOSIS — Z0000 Encounter for general adult medical examination without abnormal findings: Secondary | ICD-10-CM | POA: Diagnosis not present

## 2016-07-09 LAB — LIPID PANEL
Cholesterol: 142 mg/dL (ref <200)
HDL: 45 mg/dL (ref >40)
LDL CALC: 69 mg/dL (ref <100)
TRIGLYCERIDES: 140 mg/dL (ref <150)
Total CHOL/HDL Ratio: 3.2 Ratio (ref <5.0)
VLDL: 28 mg/dL (ref <30)

## 2016-07-09 LAB — CBC WITH DIFFERENTIAL/PLATELET
BASOS ABS: 0 {cells}/uL (ref 0–200)
Basophils Relative: 0 %
EOS PCT: 3 %
Eosinophils Absolute: 234 cells/uL (ref 15–500)
HEMATOCRIT: 40.9 % (ref 38.5–50.0)
HEMOGLOBIN: 13.8 g/dL (ref 13.2–17.1)
LYMPHS ABS: 2262 {cells}/uL (ref 850–3900)
Lymphocytes Relative: 29 %
MCH: 30 pg (ref 27.0–33.0)
MCHC: 33.7 g/dL (ref 32.0–36.0)
MCV: 88.9 fL (ref 80.0–100.0)
MONO ABS: 780 {cells}/uL (ref 200–950)
MPV: 9.3 fL (ref 7.5–12.5)
Monocytes Relative: 10 %
NEUTROS PCT: 58 %
Neutro Abs: 4524 cells/uL (ref 1500–7800)
Platelets: 275 10*3/uL (ref 140–400)
RBC: 4.6 MIL/uL (ref 4.20–5.80)
RDW: 14.2 % (ref 11.0–15.0)
WBC: 7.8 10*3/uL (ref 3.8–10.8)

## 2016-07-09 LAB — AST: AST: 21 U/L (ref 10–35)

## 2016-07-09 LAB — BASIC METABOLIC PANEL
BUN: 26 mg/dL — ABNORMAL HIGH (ref 7–25)
CALCIUM: 9.4 mg/dL (ref 8.6–10.3)
CO2: 23 mmol/L (ref 20–31)
Chloride: 104 mmol/L (ref 98–110)
Creat: 1.19 mg/dL (ref 0.70–1.25)
Glucose, Bld: 159 mg/dL — ABNORMAL HIGH (ref 65–99)
Potassium: 4.2 mmol/L (ref 3.5–5.3)
SODIUM: 136 mmol/L (ref 135–146)

## 2016-07-09 LAB — TSH: TSH: 0.97 mIU/L (ref 0.40–4.50)

## 2016-07-09 LAB — ALT: ALT: 27 U/L (ref 9–46)

## 2016-07-09 LAB — HEMOGLOBIN A1C
Hgb A1c MFr Bld: 7.8 % — ABNORMAL HIGH (ref <5.7)
Mean Plasma Glucose: 177 mg/dL

## 2016-07-09 NOTE — Assessment & Plan Note (Addendum)
Td 2014; PNM shot - 2015;  prevnar-2015 ; zostavax - 06-2016; flu shot today   PSAs per urology  CCS: reports a Cscope at age 61---normal per patient , refer to GI   Diet and exercise discussed Labs: BMP, AST, ALT, FLP, CBC, A1c, TSH, hep C
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
## 2016-07-09 NOTE — Progress Notes (Signed)
Pre visit review using our clinic review tool, if applicable. No additional management support is needed unless otherwise documented below in the visit note. 

## 2016-07-09 NOTE — Patient Instructions (Signed)
GO TO THE LAB : Get the blood work     GO TO THE FRONT DESK Schedule your next appointment for a  Check up in 4 months   

## 2016-07-10 LAB — HEPATITIS C ANTIBODY: HCV AB: NEGATIVE

## 2016-07-11 NOTE — Assessment & Plan Note (Addendum)
DM: Checking labs, continue glipizide, Actos, Janumet. Appetite has increased significantly due to SSRIs, he has gained weight. Patient anticipates A1c will be higher and would consider adjust his medication. Higher  metformin dose in the past caused some diarrhea. Invokana? victoza? HTN: Seems controlled, continue Lotrel, HCTZ Hyperlipidemia: Continue Lipitor, check labs.  Anxiety depression: Per psychiatry, on Lexapro 30 mg, he is feeling great, this is the best I've seen him emotionally. Pt believes improvement is due to lexapro, not but the fact that he retired from his job and having less stress; unfortunately is having an increase in appetite and weight gain w/ Lexapro. See comments under diabetes.rec to d/w psych pro-cons of changing Lexapro to another agent d/t increase appetite. RTC 4 months

## 2016-07-30 LAB — HM DIABETES EYE EXAM

## 2016-08-06 ENCOUNTER — Other Ambulatory Visit: Payer: Self-pay

## 2016-08-06 ENCOUNTER — Other Ambulatory Visit: Payer: Self-pay | Admitting: Internal Medicine

## 2016-08-06 MED ORDER — FLUTICASONE PROPIONATE 50 MCG/ACT NA SUSP: 2.0000 | Freq: Every day | NASAL | 2 refills | Status: DC

## 2016-08-12 ENCOUNTER — Encounter: Payer: Self-pay | Admitting: Internal Medicine

## 2016-08-31 ENCOUNTER — Other Ambulatory Visit: Payer: Self-pay | Admitting: Internal Medicine

## 2016-09-27 ENCOUNTER — Other Ambulatory Visit: Payer: Self-pay | Admitting: Internal Medicine

## 2016-10-08 ENCOUNTER — Other Ambulatory Visit: Payer: Self-pay | Admitting: Internal Medicine

## 2016-10-27 ENCOUNTER — Other Ambulatory Visit: Payer: Self-pay | Admitting: Internal Medicine

## 2016-11-02 ENCOUNTER — Ambulatory Visit: Payer: BC Managed Care – PPO | Admitting: Internal Medicine

## 2016-11-07 ENCOUNTER — Encounter: Payer: Self-pay | Admitting: Pulmonary Disease

## 2016-11-08 ENCOUNTER — Encounter: Payer: Self-pay | Admitting: Pulmonary Disease

## 2016-11-08 ENCOUNTER — Ambulatory Visit (INDEPENDENT_AMBULATORY_CARE_PROVIDER_SITE_OTHER): Payer: BC Managed Care – PPO | Admitting: Pulmonary Disease

## 2016-11-08 VITALS — BP 118/68 | HR 72 | Ht 72.0 in | Wt 253.0 lb

## 2016-11-08 DIAGNOSIS — Z6835 Body mass index (BMI) 35.0-35.9, adult: Secondary | ICD-10-CM | POA: Diagnosis not present

## 2016-11-08 DIAGNOSIS — G4733 Obstructive sleep apnea (adult) (pediatric): Secondary | ICD-10-CM | POA: Diagnosis not present

## 2016-11-08 NOTE — Progress Notes (Signed)
   Subjective:    Patient ID: Leonard [name], male    DOB: [birth year], 61 y.o.   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 Follow-up    1 yr for OSA. States breathing has been well. Pt uses [facility] as DME.    He has been maintained on CPAP of 8 cm with good improvement in his daytime somnolence and fatigue. He gained 60 pounds after starting Lexapro and Adderall but this has really helped his mood symptoms. He has an old mask and he has occasional leak on his machine. 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 The sleep efficiency of 65%. Although the oral AHI was 8 per hour, but REM related AHI was 31.8. The lowest desaturation was 87%.   8/ 2011  autoCPAP >> 8 cm based on download        Review of Systems Patient denies significant dyspnea,cough, hemoptysis,  chest pain, palpitations, pedal edema, orthopnea, paroxysmal nocturnal dyspnea, lightheadedness, nausea, vomiting, abdominal or  leg pains      Objective:   Physical Exam  Gen. Pleasant, obese, in no distress ENT - no lesions, no post nasal drip Neck: No JVD, no thyromegaly, no carotid bruits Lungs: no use of accessory muscles, no dullness to percussion, decreased without rales or rhonchi  Cardiovascular: Rhythm regular, heart sounds  normal, no murmurs or gallops, no peripheral edema Musculoskeletal: No deformities, no cyanosis or clubbing , no tremors       Assessment & Plan:

## 2016-11-08 NOTE — Assessment & Plan Note (Signed)
New CPAP 8 cm  Weight loss encouraged, compliance with goal of at least 4-6 hrs every night is the expectation. Advised against medications with sedative side effects Cautioned against driving when sleepy - understanding that sleepiness will vary on a day to day basis

## 2016-11-08 NOTE — Patient Instructions (Signed)
Rx for new CPAp will be sent

## 2016-11-08 NOTE — Assessment & Plan Note (Signed)
Weight loss encouraged 

## 2016-12-14 ENCOUNTER — Encounter: Payer: Self-pay | Admitting: Gastroenterology

## 2016-12-22 ENCOUNTER — Other Ambulatory Visit: Payer: Self-pay | Admitting: Internal Medicine

## 2017-01-20 ENCOUNTER — Other Ambulatory Visit: Payer: Self-pay | Admitting: Internal Medicine

## 2017-01-20 ENCOUNTER — Other Ambulatory Visit: Payer: Self-pay

## 2017-02-14 ENCOUNTER — Other Ambulatory Visit: Payer: Self-pay | Admitting: Family Medicine

## 2017-02-14 ENCOUNTER — Other Ambulatory Visit: Payer: Self-pay | Admitting: Internal Medicine

## 2017-02-15 ENCOUNTER — Encounter: Payer: Self-pay | Admitting: Internal Medicine

## 2017-02-15 ENCOUNTER — Ambulatory Visit (INDEPENDENT_AMBULATORY_CARE_PROVIDER_SITE_OTHER): Payer: BC Managed Care – PPO | Admitting: Internal Medicine

## 2017-02-15 VITALS — BP 124/78 | HR 76 | Temp 98.1°F | Resp 14 | Ht 72.0 in | Wt 277.2 lb

## 2017-02-15 DIAGNOSIS — I10 Essential (primary) hypertension: Secondary | ICD-10-CM

## 2017-02-15 DIAGNOSIS — F32A Depression, unspecified: Secondary | ICD-10-CM

## 2017-02-15 DIAGNOSIS — F329 Major depressive disorder, single episode, unspecified: Secondary | ICD-10-CM | POA: Diagnosis not present

## 2017-02-15 DIAGNOSIS — E119 Type 2 diabetes mellitus without complications: Secondary | ICD-10-CM

## 2017-02-15 DIAGNOSIS — F419 Anxiety disorder, unspecified: Secondary | ICD-10-CM | POA: Diagnosis not present

## 2017-02-15 DIAGNOSIS — E785 Hyperlipidemia, unspecified: Secondary | ICD-10-CM

## 2017-02-15 DIAGNOSIS — G629 Polyneuropathy, unspecified: Secondary | ICD-10-CM | POA: Diagnosis not present

## 2017-02-15 LAB — COMPREHENSIVE METABOLIC PANEL
ALT: 32 U/L (ref 0–53)
AST: 27 U/L (ref 0–37)
Albumin: 4.2 g/dL (ref 3.5–5.2)
Alkaline Phosphatase: 75 U/L (ref 39–117)
BUN: 24 mg/dL — AB (ref 6–23)
CHLORIDE: 102 meq/L (ref 96–112)
CO2: 26 mEq/L (ref 19–32)
Calcium: 9.3 mg/dL (ref 8.4–10.5)
Creatinine, Ser: 1.26 mg/dL (ref 0.40–1.50)
GFR: 61.84 mL/min (ref >60.00)
GLUCOSE: 181 mg/dL — AB (ref 70–99)
POTASSIUM: 4.1 meq/L (ref 3.5–5.1)
SODIUM: 135 meq/L (ref 135–145)
Total Bilirubin: 0.7 mg/dL (ref 0.2–1.2)
Total Protein: 7.9 g/dL (ref 6.0–8.3)

## 2017-02-15 LAB — HEMOGLOBIN A1C: Hgb A1c MFr Bld: 8.7 % — ABNORMAL HIGH (ref 4.6–6.5)

## 2017-02-15 LAB — VITAMIN B12: Vitamin B-12: 807 pg/mL (ref 211–911)

## 2017-02-15 LAB — FOLATE

## 2017-02-15 NOTE — Progress Notes (Signed)
Pre visit review using our clinic review tool, if applicable. No additional management support is needed unless otherwise documented below in the visit note. 
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
## 2017-02-15 NOTE — Patient Instructions (Signed)
GO TO THE LAB : Get the blood work     GO TO THE FRONT DESK Schedule your next appointment for a  checkup in 6 weeks   Diabetes and Foot Care Diabetes may cause you to have problems because of poor blood supply (circulation) to your feet and legs. This may cause the skin on your feet to become thinner, break easier, and heal more slowly. Your skin may become dry, and the skin may peel and crack. You may also have nerve damage in your legs and feet causing decreased feeling in them. You may not notice minor injuries to your feet that could lead to infections or more serious problems. Taking care of your feet is one of the most important things you can do for yourself. Follow these instructions at home:  Wear shoes at all times, even in the house. Do not go barefoot. Bare feet are easily injured.  Check your feet daily for blisters, cuts, and redness. If you cannot see the bottom of your feet, use a mirror or ask someone for help.  Wash your feet with warm water (do not use hot water) and mild soap. Then pat your feet and the areas between your toes until they are completely dry. Do not soak your feet as this can dry your skin.  Apply a moisturizing lotion or petroleum jelly (that does not contain alcohol and is unscented) to the skin on your feet and to dry, brittle toenails. Do not apply lotion between your toes.  Trim your toenails straight across. Do not dig under them or around the cuticle. File the edges of your nails with an emery board or nail file.  Do not cut corns or calluses or try to remove them with medicine.  Wear clean socks or stockings every day. Make sure they are not too tight. Do not wear knee-high stockings since they may decrease blood flow to your legs.  Wear shoes that fit properly and have enough cushioning. To break in new shoes, wear them for just a few hours a day. This prevents you from injuring your feet. Always look in your shoes before you put them on to be  sure there are no objects inside.  Do not cross your legs. This may decrease the blood flow to your feet.  If you find a minor scrape, cut, or break in the skin on your feet, keep it and the skin around it clean and dry. These areas may be cleansed with mild soap and water. Do not cleanse the area with peroxide, alcohol, or iodine.  When you remove an adhesive bandage, be sure not to damage the skin around it.  If you have a wound, look at it several times a day to make sure it is healing.  Do not use heating pads or hot water bottles. They may burn your skin. If you have lost feeling in your feet or legs, you may not know it is happening until it is too late.  Make sure your health care provider performs a complete foot exam at least annually or more often if you have foot problems. Report any cuts, sores, or bruises to your health care provider immediately. Contact a health care provider if:  You have an injury that is not healing.  You have cuts or breaks in the skin.  You have an ingrown nail.  You notice redness on your legs or feet.  You feel burning or tingling in your legs or feet.  You  have pain or cramps in your legs and feet.  Your legs or feet are numb.  Your feet always feel cold. Get help right away if:  There is increasing redness, swelling, or pain in or around a wound.  There is a red line that goes up your leg.  Pus is coming from a wound.  You develop a fever or as directed by your health care provider.  You notice a bad smell coming from an ulcer or wound. This information is not intended to replace advice given to you by your health care provider. Make sure you discuss any questions you have with your health care provider. Document Released: 06/11/2000 Document Revised: 11/20/2015 Document Reviewed: 11/21/2012 Elsevier Interactive Patient Education  2017 [employer].

## 2017-02-15 NOTE — Assessment & Plan Note (Signed)
DM: Currently on Janumet, glipizide twice a day and Actos. Not well-controlled per last A1c. Patient is willing to take Victoza. Will check A1c . Strongly encouraged to watch his diet more closely. Consider decrease dose of glipizide. Neuropathy: Has decrease sensitivity of both plantar areas, see physical exam. Likely due to diabetes, will check a D62 and folic acid. Feet care discussed HTN: Well-controlled on amlodipine, benazepril, HCTZ, check a CMP High cholesterol: Well-controlled Morbid obesity: Weight has increased lately in part because he is taking Lexapro. Information about the bariatric office provided. 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 + Weight gain since he started SSRIs.  F/u per Dr Toy Care RTC 6 weeks.

## 2017-02-17 MED ORDER — LIRAGLUTIDE 18 MG/3ML ~~LOC~~ SOPN: PEN_INJECTOR | SUBCUTANEOUS | 2 refills | Status: DC

## 2017-02-17 MED ORDER — INSULIN PEN NEEDLE 32G X 6 MM MISC: 1 refills | Status: DC

## 2017-02-17 NOTE — Addendum Note (Signed)
Addended byDamita Dunnings D on: 02/17/2017 08:22 AM   Modules accepted: Orders

## 2017-03-17 ENCOUNTER — Other Ambulatory Visit: Payer: Self-pay | Admitting: Family Medicine

## 2017-03-17 ENCOUNTER — Other Ambulatory Visit: Payer: Self-pay | Admitting: Internal Medicine

## 2017-03-22 ENCOUNTER — Telehealth: Payer: Self-pay | Admitting: Internal Medicine

## 2017-03-22 NOTE — Telephone Encounter (Signed)
LMOM, patient was supposed to start Victoza, I don't see that he return to the office for injection teaching, asked for a call back.

## 2017-03-23 NOTE — Telephone Encounter (Signed)
Started Victoza. Thx

## 2017-03-23 NOTE — Telephone Encounter (Signed)
Pt returned call, he says that he has started Victoza he has been taking medication for 19 days. Pt says he took .6MG  the first 7 days and 1.2MG  the last 12 days as instructed. Pt says that it is going well and is helping. He has also been recording his glu close readings for PCP daily. Pt is scheduled to come in on Tuesday October 2,2018 to see provider.

## 2017-03-24 ENCOUNTER — Other Ambulatory Visit: Payer: Self-pay | Admitting: Internal Medicine

## 2017-03-29 ENCOUNTER — Ambulatory Visit: Payer: BC Managed Care – PPO | Admitting: Internal Medicine

## 2017-04-06 ENCOUNTER — Ambulatory Visit: Payer: Self-pay | Admitting: Internal Medicine

## 2017-04-11 ENCOUNTER — Other Ambulatory Visit: Payer: Self-pay | Admitting: Pulmonary Disease

## 2017-04-12 ENCOUNTER — Ambulatory Visit (INDEPENDENT_AMBULATORY_CARE_PROVIDER_SITE_OTHER): Payer: BC Managed Care – PPO | Admitting: Internal Medicine

## 2017-04-12 ENCOUNTER — Encounter: Payer: Self-pay | Admitting: Internal Medicine

## 2017-04-12 VITALS — BP 124/72 | HR 69 | Temp 97.9°F | Resp 14 | Ht 72.0 in | Wt 279.1 lb

## 2017-04-12 DIAGNOSIS — Z23 Encounter for immunization: Secondary | ICD-10-CM

## 2017-04-12 DIAGNOSIS — E119 Type 2 diabetes mellitus without complications: Secondary | ICD-10-CM | POA: Diagnosis not present

## 2017-04-12 MED ORDER — SITAGLIPTIN PHOS-METFORMIN HCL 50-1000 MG PO TABS: 1.0000 | ORAL_TABLET | Freq: Two times a day (BID) | ORAL | 5 refills | Status: DC

## 2017-04-12 MED ORDER — LIRAGLUTIDE 18 MG/3ML ~~LOC~~ SOPN
1.8000 mg | PEN_INJECTOR | Freq: Every day | SUBCUTANEOUS | 5 refills | Status: DC
Start: 2017-04-12 — End: 2017-10-29

## 2017-04-12 NOTE — Assessment & Plan Note (Signed)
DM: Last A1c 8.7, Victoza was added, no apparent s/e. CBGs in a.m. continue to be high in the 200s, the rest of the day CBGs are  good. Admits that he eats 90% of the carbohydrates w/ dinner . Plan: Increase Victoza to 1.8 mg daily. Continue Actos, Glucotrol. Increase Janumet 50/500 to 50/1000. Will spread CHO intake through the day. A1c in 6 weeks Neuropathy: Since the last visit B63 and folic acid were normal. Flu shot today. Follow-up: A1c 6 weeks, RTC 3 months

## 2017-04-12 NOTE — Patient Instructions (Addendum)
   GO TO THE FRONT DESK Schedule your next appointment for labs, 6 weeks from today (A1c)  Schedule your physical exam for 3 months from today   Increase Janumet to 50-1000 mgs one tablet twice a day Increase Victoza to 1.8 mg daily  Spread your carbohydrate intake throughout the day

## 2017-04-12 NOTE — Progress Notes (Signed)
Pre visit review using our clinic review tool, if applicable. No additional management support is needed unless otherwise documented below in the visit note. 
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
## 2017-04-26 ENCOUNTER — Encounter: Payer: Self-pay | Admitting: Gastroenterology

## 2017-05-12 ENCOUNTER — Other Ambulatory Visit: Payer: Self-pay | Admitting: Internal Medicine

## 2017-05-24 ENCOUNTER — Other Ambulatory Visit (INDEPENDENT_AMBULATORY_CARE_PROVIDER_SITE_OTHER): Payer: BC Managed Care – PPO

## 2017-05-24 DIAGNOSIS — E119 Type 2 diabetes mellitus without complications: Secondary | ICD-10-CM | POA: Diagnosis not present

## 2017-05-24 LAB — HEMOGLOBIN A1C: Hgb A1c MFr Bld: 7.4 % — ABNORMAL HIGH (ref 4.6–6.5)

## 2017-06-15 ENCOUNTER — Other Ambulatory Visit: Payer: Self-pay

## 2017-06-15 ENCOUNTER — Ambulatory Visit (AMBULATORY_SURGERY_CENTER): Payer: Self-pay | Admitting: *Deleted

## 2017-06-15 VITALS — Ht 72.0 in | Wt 280.0 lb

## 2017-06-15 DIAGNOSIS — Z1211 Encounter for screening for malignant neoplasm of colon: Secondary | ICD-10-CM

## 2017-06-15 MED ORDER — NA SULFATE-K SULFATE-MG SULF 17.5-3.13-1.6 GM/177ML PO SOLN: ORAL | 0 refills | Status: DC

## 2017-06-15 NOTE — Progress Notes (Signed)
Patient denies any allergies to eggs or soy. Patient denies any problems with anesthesia/sedation. Patient denies any oxygen use at home. Patient denies taking any diet/weight loss medications or blood thinners. EMMI education assisgned to patient on colonoscopy, this was explained and instructions given to patient. SUPREP COUPON printed and given to pt.

## 2017-06-30 ENCOUNTER — Encounter: Payer: Self-pay | Admitting: Gastroenterology

## 2017-07-04 ENCOUNTER — Telehealth: Payer: Self-pay | Admitting: Gastroenterology

## 2017-07-04 MED ORDER — NA SULFATE-K SULFATE-MG SULF 17.5-3.13-1.6 GM/177ML PO SOLN: 1.0000 | Freq: Once | ORAL | 0 refills | Status: AC

## 2017-07-04 NOTE — Telephone Encounter (Signed)
Pt needs it sent to [facility]

## 2017-07-04 NOTE — Telephone Encounter (Signed)
Prep  resent to  [facility] - called pt and informed him it was resent  Leonard [name] PV

## 2017-07-06 ENCOUNTER — Encounter: Payer: Self-pay | Admitting: Gastroenterology

## 2017-07-06 ENCOUNTER — Ambulatory Visit (AMBULATORY_SURGERY_CENTER): Payer: BC Managed Care – PPO | Admitting: Gastroenterology

## 2017-07-06 ENCOUNTER — Other Ambulatory Visit: Payer: Self-pay

## 2017-07-06 VITALS — BP 137/81 | HR 65 | Temp 98.0°F | Resp 13 | Ht 72.0 in | Wt 279.0 lb

## 2017-07-06 DIAGNOSIS — Z1211 Encounter for screening for malignant neoplasm of colon: Secondary | ICD-10-CM | POA: Diagnosis present

## 2017-07-06 DIAGNOSIS — D122 Benign neoplasm of ascending colon: Secondary | ICD-10-CM

## 2017-07-06 DIAGNOSIS — Z1212 Encounter for screening for malignant neoplasm of rectum: Secondary | ICD-10-CM | POA: Diagnosis not present

## 2017-07-06 MED ORDER — SODIUM CHLORIDE 0.9 % IV SOLN: 500.0000 mL | Freq: Once | INTRAVENOUS | Status: DC

## 2017-07-06 NOTE — Progress Notes (Signed)
Called to room to assist during endoscopic procedure.  Patient ID and intended procedure confirmed with present staff. Received instructions for my participation in the procedure from the performing physician.  

## 2017-07-06 NOTE — Patient Instructions (Signed)
YOU HAD AN ENDOSCOPIC PROCEDURE TODAY AT THE [facility]:   Refer to the procedure report that was given to you for any specific questions about what was found during the examination.  If the procedure report does not answer your questions, please call your gastroenterologist to clarify.  If you requested that your care partner not be given the details of your procedure findings, then the procedure report has been included in a sealed envelope for you to review at your convenience later.  YOU SHOULD EXPECT: Some feelings of bloating in the abdomen. Passage of more gas than usual.  Walking can help get rid of the air that was put into your GI tract during the procedure and reduce the bloating. If you had a lower endoscopy (such as a colonoscopy or flexible sigmoidoscopy) you may notice spotting of blood in your stool or on the toilet paper. If you underwent a bowel prep for your procedure, you may not have a normal bowel movement for a few days.  Please Note:  You might notice some irritation and congestion in your nose or some drainage.  This is from the oxygen used during your procedure.  There is no need for concern and it should clear up in a day or so.  SYMPTOMS TO REPORT IMMEDIATELY:   Following lower endoscopy (colonoscopy or flexible sigmoidoscopy):  Excessive amounts of blood in the stool  Significant tenderness or worsening of abdominal pains  Swelling of the abdomen that is new, acute  Fever of 100F or higher    For urgent or emergent issues, a gastroenterologist can be reached at any hour by calling [phone].   DIET:  We do recommend a small meal at first, but then you may proceed to your regular diet.  Drink plenty of fluids but you should avoid alcoholic beverages for 24 hours.  ACTIVITY:  You should plan to take it easy for the rest of today and you should NOT DRIVE or use heavy machinery until tomorrow (because of the sedation medicines used during the test).     FOLLOW UP: Our staff will call the number listed on your records the next business day following your procedure to check on you and address any questions or concerns that you may have regarding the information given to you following your procedure. If we do not reach you, we will leave a message.  However, if you are feeling well and you are not experiencing any problems, there is no need to return our call.  We will assume that you have returned to your regular daily activities without incident.  If any biopsies were taken you will be contacted by phone or by letter within the next 1-3 weeks.  Please call us at [phone] if you have not heard about the biopsies in 3 weeks.    SIGNATURES/CONFIDENTIALITY: You and/or your care partner have signed paperwork which will be entered into your electronic medical record.  These signatures attest to the fact that that the information above on your After Visit Summary has been reviewed and is understood.  Full responsibility of the confidentiality of this discharge information lies with you and/or your care-partner.   Resume medications. Information given on polyps and hemorrhoids. 

## 2017-07-06 NOTE — Op Note (Addendum)
[facility] Patient Name: Leonard [name] Procedure Date: 07/06/2017 9:34 AM MRN: [id number] Endoscopist: Ladene Artist , MD Age: 62 Referring MD:  Date of Birth: 12-15-1955 Gender: Male Account #: [id number] Procedure:                Colonoscopy Indications:              Screening for colorectal malignant neoplasm Medicines:                Monitored Anesthesia Care Procedure:                Pre-Anesthesia Assessment:                           - Prior to the procedure, a History and Physical                            was performed, and patient medications and                            allergies were reviewed. The patient's tolerance of                            previous anesthesia was also reviewed. The risks                            and benefits of the procedure and the sedation                            options and risks were discussed with the patient.                            All questions were answered, and informed consent                            was obtained. Prior Anticoagulants: The patient has                            taken no previous anticoagulant or antiplatelet                            agents. ASA Grade Assessment: II - A patient with                            mild systemic disease. After reviewing the risks                            and benefits, the patient was deemed in                            satisfactory condition to undergo the procedure.                           After obtaining informed consent, the colonoscope  was passed under direct vision. Throughout the                            procedure, the patient's blood pressure, pulse, and                            oxygen saturations were monitored continuously. The                            Colonoscope was introduced through the anus and                            advanced to the the cecum, identified by                            appendiceal orifice and  ileocecal valve. The                            ileocecal valve, appendiceal orifice, and rectum                            were photographed. The quality of the bowel                            preparation was good. The colonoscopy was performed                            without difficulty. The patient tolerated the                            procedure well. Scope In: 9:45:56 AM Scope Out: 10:00:15 AM Scope Withdrawal Time: 0 hours 12 minutes 23 seconds  Total Procedure Duration: 0 hours 14 minutes 19 seconds  Findings:                 The perianal and digital rectal examinations were                            normal.                           A 7 mm polyp was found in the ascending colon. The                            polyp was sessile. The polyp was removed with a                            cold snare. Resection and retrieval were complete.                           Internal hemorrhoids were found during                            retroflexion. The hemorrhoids were small and Grade  I (internal hemorrhoids that do not prolapse).                           The exam was otherwise without abnormality on                            direct and retroflexion views. Complications:            No immediate complications. Estimated blood loss:                            None. Estimated Blood Loss:     Estimated blood loss: none. Impression:               - One 7 mm polyp in the ascending colon, removed                            with a cold snare. Resected and retrieved.                           - Internal hemorrhoids.                           - The examination was otherwise normal on direct                            and retroflexion views. Recommendation:           - Repeat colonoscopy in 5 years for surveillance if                            polyp is precancerous, otherwise 10 years.                           - Patient has a contact number available for                             emergencies. The signs and symptoms of potential                            delayed complications were discussed with the                            patient. Return to normal activities tomorrow.                            Written discharge instructions were provided to the                            patient.                           - Resume previous diet.                           - Continue present medications.                           -  Await pathology results. Ladene Artist, MD 07/06/2017 10:03:21 AM This report has been signed electronically.

## 2017-07-06 NOTE — Progress Notes (Signed)
Report to PACU, RN, vss, BBS= Clear.  

## 2017-07-06 NOTE — Progress Notes (Signed)
Pt's states no medical or surgical changes since previsit or office visit. 

## 2017-07-07 ENCOUNTER — Other Ambulatory Visit: Payer: Self-pay | Admitting: Pulmonary Disease

## 2017-07-07 ENCOUNTER — Telehealth: Payer: Self-pay | Admitting: *Deleted

## 2017-07-07 NOTE — Telephone Encounter (Signed)
  Follow up Call-  Call back number 07/06/2017  Post procedure Call Back phone  # [phone]  Permission to leave phone message Yes  Some recent data might be hidden     Patient questions:  Do you have a fever, pain , or abdominal swelling? No. Pain Score  0 *  Have you tolerated food without any problems? Yes.    Have you been able to return to your normal activities? Yes.    Do you have any questions about your discharge instructions: Diet   No. Medications  No. Follow up visit  No.  Do you have questions or concerns about your Care? No.  Actions: * If pain score is 4 or above: No action needed, pain <4.

## 2017-07-12 ENCOUNTER — Ambulatory Visit: Payer: BC Managed Care – PPO | Admitting: Internal Medicine

## 2017-07-19 ENCOUNTER — Encounter: Payer: Self-pay | Admitting: Gastroenterology

## 2017-08-25 ENCOUNTER — Ambulatory Visit (INDEPENDENT_AMBULATORY_CARE_PROVIDER_SITE_OTHER): Payer: BC Managed Care – PPO

## 2017-08-25 ENCOUNTER — Ambulatory Visit (INDEPENDENT_AMBULATORY_CARE_PROVIDER_SITE_OTHER): Payer: BC Managed Care – PPO | Admitting: Orthopedic Surgery

## 2017-08-25 ENCOUNTER — Encounter (INDEPENDENT_AMBULATORY_CARE_PROVIDER_SITE_OTHER): Payer: Self-pay | Admitting: Orthopedic Surgery

## 2017-08-25 VITALS — Ht 72.0 in | Wt 279.0 lb

## 2017-08-25 DIAGNOSIS — M6702 Short Achilles tendon (acquired), left ankle: Secondary | ICD-10-CM | POA: Insufficient documentation

## 2017-08-25 DIAGNOSIS — M79672 Pain in left foot: Secondary | ICD-10-CM | POA: Insufficient documentation
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
## 2017-09-01 ENCOUNTER — Other Ambulatory Visit: Payer: Self-pay | Admitting: Internal Medicine

## 2017-09-06 ENCOUNTER — Ambulatory Visit: Payer: BC Managed Care – PPO | Admitting: Internal Medicine

## 2017-09-07 ENCOUNTER — Other Ambulatory Visit: Payer: Self-pay | Admitting: Internal Medicine

## 2017-09-21 ENCOUNTER — Ambulatory Visit (INDEPENDENT_AMBULATORY_CARE_PROVIDER_SITE_OTHER): Payer: BC Managed Care – PPO | Admitting: Orthopedic Surgery

## 2017-09-29 ENCOUNTER — Ambulatory Visit (INDEPENDENT_AMBULATORY_CARE_PROVIDER_SITE_OTHER): Payer: BC Managed Care – PPO | Admitting: Orthopedic Surgery

## 2017-09-29 ENCOUNTER — Other Ambulatory Visit: Payer: Self-pay | Admitting: Internal Medicine

## 2017-09-29 ENCOUNTER — Encounter (INDEPENDENT_AMBULATORY_CARE_PROVIDER_SITE_OTHER): Payer: Self-pay | Admitting: Orthopedic Surgery

## 2017-09-29 DIAGNOSIS — M79672 Pain in left foot: Secondary | ICD-10-CM | POA: Diagnosis not present
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
## 2017-10-25 ENCOUNTER — Other Ambulatory Visit: Payer: Self-pay | Admitting: Internal Medicine

## 2017-10-26 ENCOUNTER — Telehealth: Payer: Self-pay

## 2017-10-26 NOTE — Telephone Encounter (Signed)
Please advise 

## 2017-10-26 NOTE — Telephone Encounter (Signed)
Copied from [facility] [id number]. Topic: Inquiry >> Oct 26, 2017  9:12 AM Leonard [name], NT wrote: Patient states he would like to switch PCP from Dr. Larose Kells - Dr. Ethelene Hal at [facility]. He states his wife is a patient of doctor Ethelene Hal and he can actually walk to the doctor office. Please contact patient to schedule if transfer is approved. [place] states it is okay to leave a voicemail if needed,

## 2017-10-26 NOTE — Telephone Encounter (Signed)
That is ok, thx  

## 2017-10-27 NOTE — Telephone Encounter (Signed)
Okay 

## 2017-10-27 NOTE — Telephone Encounter (Signed)
Okay to schedule patient. 

## 2017-10-27 NOTE — Telephone Encounter (Signed)
Okay for transfer 

## 2017-10-29 ENCOUNTER — Other Ambulatory Visit: Payer: Self-pay | Admitting: Internal Medicine

## 2017-10-31 NOTE — Telephone Encounter (Signed)
Appt scheduled 11/02/2017 w/ Dr. Ethelene Hal

## 2017-11-02 ENCOUNTER — Encounter: Payer: Self-pay | Admitting: Family Medicine

## 2017-11-02 ENCOUNTER — Ambulatory Visit: Payer: BC Managed Care – PPO | Admitting: Family Medicine

## 2017-11-02 VITALS — BP 134/80 | HR 83 | Ht 72.0 in | Wt 269.2 lb

## 2017-11-02 DIAGNOSIS — E782 Mixed hyperlipidemia: Secondary | ICD-10-CM

## 2017-11-02 DIAGNOSIS — I10 Essential (primary) hypertension: Secondary | ICD-10-CM | POA: Diagnosis not present

## 2017-11-02 DIAGNOSIS — E119 Type 2 diabetes mellitus without complications: Secondary | ICD-10-CM

## 2017-11-02 LAB — COMPREHENSIVE METABOLIC PANEL
ALT: 27 U/L (ref 0–53)
AST: 22 U/L (ref 0–37)
Albumin: 4.4 g/dL (ref 3.5–5.2)
Alkaline Phosphatase: 63 U/L (ref 39–117)
BUN: 22 mg/dL (ref 6–23)
CO2: 24 meq/L (ref 19–32)
Calcium: 9.6 mg/dL (ref 8.4–10.5)
Chloride: 104 mEq/L (ref 96–112)
Creatinine, Ser: 1.11 mg/dL (ref 0.40–1.50)
GFR: 71.41 mL/min (ref >60.00)
GLUCOSE: 109 mg/dL — AB (ref 70–99)
POTASSIUM: 4 meq/L (ref 3.5–5.1)
Sodium: 138 mEq/L (ref 135–145)
Total Bilirubin: 0.5 mg/dL (ref 0.2–1.2)
Total Protein: 7.4 g/dL (ref 6.0–8.3)

## 2017-11-02 LAB — URINALYSIS, ROUTINE W REFLEX MICROSCOPIC
Bilirubin Urine: NEGATIVE
HGB URINE DIPSTICK: NEGATIVE
Ketones, ur: NEGATIVE
Leukocytes, UA: NEGATIVE
NITRITE: NEGATIVE
PH: 5.5 (ref 5.0–8.0)
RBC / HPF: NONE SEEN (ref 0–?)
Specific Gravity, Urine: 1.025 (ref 1.000–1.030)
TOTAL PROTEIN, URINE-UPE24: 30 — AB
URINE GLUCOSE: NEGATIVE
Urobilinogen, UA: 0.2 (ref 0.0–1.0)

## 2017-11-02 LAB — CBC
HCT: 40.6 % (ref 39.0–52.0)
Hemoglobin: 14 g/dL (ref 13.0–17.0)
MCHC: 34.4 g/dL (ref 30.0–36.0)
MCV: 88.6 fl (ref 78.0–100.0)
Platelets: 319 10*3/uL (ref 150.0–400.0)
RBC: 4.58 Mil/uL (ref 4.22–5.81)
RDW: 13.8 % (ref 11.5–15.5)
WBC: 7.1 10*3/uL (ref 4.0–10.5)

## 2017-11-02 LAB — LIPID PANEL
CHOL/HDL RATIO: 4
CHOLESTEROL: 137 mg/dL (ref 0–200)
HDL: 38.3 mg/dL — AB (ref >39.00)
NONHDL: 98.39
TRIGLYCERIDES: 204 mg/dL — AB (ref 0.0–149.0)
VLDL: 40.8 mg/dL — ABNORMAL HIGH (ref 0.0–40.0)

## 2017-11-02 LAB — LDL CHOLESTEROL, DIRECT: LDL DIRECT: 66 mg/dL

## 2017-11-02 LAB — MICROALBUMIN / CREATININE URINE RATIO
Creatinine,U: 133.5 mg/dL
Microalb Creat Ratio: 23.1 mg/g (ref 0.0–30.0)
Microalb, Ur: 30.8 mg/dL — ABNORMAL HIGH (ref 0.0–1.9)

## 2017-11-02 LAB — HEMOGLOBIN A1C: Hgb A1c MFr Bld: 7.6 % — ABNORMAL HIGH (ref 4.6–6.5)

## 2017-11-02 MED ORDER — BASAGLAR KWIKPEN 100 UNIT/ML ~~LOC~~ SOPN: 15.0000 [IU] | PEN_INJECTOR | Freq: Every day | SUBCUTANEOUS | 12 refills | Status: DC

## 2017-11-02 NOTE — Patient Instructions (Addendum)
Plan de alimentacin DASH DASH Eating Plan DASH es la sigla en ingls de "Enfoques Alimentarios para Detener la Hipertensin" (Dietary Approaches to Stop Hypertension). El plan de alimentacin DASH ha demostrado bajar la presin arterial elevada (hipertensin). Tambin puede reducir [employer] de diabetes tipo 2, enfermedad cardaca y accidente cerebrovascular. Este plan tambin puede ayudar a Horticulturist, commercial. Consejos para seguir este plan [place] generales  Evite ingerir ms de 2,300 mg (miligramos) de sal (sodio) por da. Si tiene hipertensin, es posible que necesite reducir la ingesta de sodio a 1,500 mg por da.  Limite el consumo de alcohol a no ms de 53mdida por da si es mujer y no est [place] y 259midas por da si es hombre. Una medida equivale a 12oz (35552mde cerveza, 5oz (148m27me vino o 1oz (44ml43m bebidas alcohlicas de alta graduacin.  Trabaje con su mdico para mantener un peso saludable o perder peso.[place] Mediagntele cul es el peso recomendado para usted.  Realice al menos 30 minutos de ejercicio que haga que se acelere su corazn (ejercicio aerbiArboriculturistmayorHartford Financiala semanBayside Gardensas actividades pueden incluir caminar, nadar o andar en bicicleta.  Trabaje con su mdico o especialista en alimentacin y nutricin (nutricionista) para ajustar su plan alimentario a sus necesidades calricas personales. Lectura de las etiquetas de los alimentos  Verifique en las etiquetas de los alimentos, la cantidad de sodio por porcin. Elija alimentos con menos del 5 por ciento del valor diario de sodio. Generalmente, los alimentos con menos de 300 mg de sodio por porcin se encuadran dentro de este plan alimentario.  Para encontrar cereales integrales, busque la palabra "integral" como primera palabra en la lista de ingredientes. De compras  Compre productos en los que en su etiqueta diga: "bajo contenido de sodio" o "sin agregado de sal".  Compre alimentos frescos. Evite  los alimentos enlatados y comidas precocidas o congeladas. Coccin  Evite agregar sal cuando cocine. Use hierbas o aderezos sin sal, en lugar de sal de mesa o sal marina. Consulte al mdico o farmacutico antes de usar sustitutos de la sal.  No fra los alimentos. A la hora de cocinar los alimentos opte por hornearlos, hervirlos, grillarlos y asarlos a la paAdministrator, artscine con aceites cardiosaludables, como oliva, canola, soja o girasol. Planificacin de las comidas   Consuma una dieta equilibrada, que incluya lo siguiente: ? 5o ms porciones de frutas y verduSet designerte de que la mitad del plato de cada comida sean frutas y verduras. ? Hasta 6 u 8 porciones de cereales integrales por da. ? Menos de 6 onzas de carne, aves o pescado magroGames developer porcin de 3 onzas de carne tiene casi el mismo tamao que un mazo de cartas. Un huevo equivale a 1 onza. ? Dos porciones de productos lcteos descremados por da. ?Training and development officerna porcin de frutos secos, semillas o frijoles 5 veces por semana. ? Grasas cardiosaludables. Las grasas saludables llamadas cidos grasos omega-3 se encuentran en alimentos como semillas de lino y pescados de agua fra, como por ejemplo, sardinas, salmn y caballa.  Limite la cantidad que ingiere de los siguientes alimentos: ? Alimentos enlatados o envasados. ? Alimentos con alto contenido de grasa trans, como alimentos fritos. ? Alimentos con alto contenido de grasa saturada, como carne con grasa. ? Dulces, postres, bebidas azucaradas y otros alimentos con azcar agregada. ? Productos lcteos enteros.  No le agregue sal a los alimentos antes de probarlos.  Trate de comer al  menos 2 comidas vegetarianas por semana.  Consuma ms comida casera y menos de restaurante, de bufs y comida rpida.  Cuando coma en un restaurante, pida que preparen su comida con menos sal o, en lo posible, sin nada de sal. Qu alimentos se recomiendan? Los alimentos enumerados a  continuacin no constituyen Furniture conservator/restorer. Hable con el nutricionista sobre las mejores opciones alimenticias para usted. Cereales Pan de salvado o integral. Pasta de salvado o integral. Arroz integral. Avena. Quinua. Trigo burgol. Cereales integrales y con bajo contenido de sodio. Pan pita. Galletitas de [place] con bajo contenido de [place] y [place]. Tortillas de [place] integral. Verduras Verduras frescas o congeladas (crudas, al vapor, asadas o grilladas). Jugos de tomate y verduras con bajo contenido de sodio o reducidos en sodio. Salsa y pasta de tomate con bajo contenido de sodio o reducidas en sodio. Verduras enlatadas con bajo contenido de sodio o reducidas en sodio. Frutas Todas las frutas frescas, congeladas o disecadas. Frutas enlatadas en jugo natural (sin agregado de azcar). Carne y otros alimentos proteicos Pollo o pavo sin piel. Carne de pollo o de [place]. Cerdo desgrasado. Pescado y [employer]. Claras de huevo. Porotos, guisantes o lentejas secos. Frutos secos, mantequilla de frutos secos y semillas sin sal. Frijoles enlatados sin sal. Cortes de carne vacuna magra, desgrasada. Embutidos magros, con bajo contenido de [place]. Lcteos Leche descremada (1%) o descremada. Quesos sin grasa, con bajo contenido de grasa o descremados. Queso blanco o ricota sin grasa, con bajo contenido de [place]. Yogur semidescremado o descremado. Queso con bajo contenido de [place] y [place]. Grasas y [employer] untables que no contengan grasas trans. Aceite vegetal. Lubertha Basque y aderezos para ensaladas livianos o con bajo contenido de grasas (reducidos en sodio). Aceite de canola, crtamo, oliva, soja y [place]. [place]. Condimentos y otros alimentos Hierbas. Especias. Mezclas de condimentos sin sal. Palomitas de maz y pretzels sin sal. Dulces con bajo contenido de grasas. Qu alimentos no se recomiendan? Los alimentos enumerados a continuacin no constituyen Furniture conservator/restorer. Hable con el  nutricionista sobre las mejores opciones alimenticias para usted. Cereales Productos de panificacin hechos con grasa, como medialunas, magdalenas y algunos panes. Comidas con arroz o pasta seca listas para usar. Verduras Verduras con crema o fritas. Verduras en [place]. Verduras enlatadas regulares (que no sean con bajo contenido de sodio o reducidas en sodio). Pasta y salsa de tomates enlatadas regulares (que no sean con bajo contenido de sodio o reducidas en sodio). Jugos de tomate y verduras regulares (que no sean con bajo contenido de sodio o reducidos en sodio). Pepinillos. Aceitunas. Lambert Mody Fruta enlatada en almbar liviano o espeso. Frutas cocidas en aceite. Frutas con salsa de crema o [place]. Carne y otros alimentos proteicos Cortes de carne con grasa. Costillas. Carne frita. [place]. Salchichas. Mortadela y otras carnes procesadas. Salame. Panceta. Perros calientes (hotdogs). [place]. Frutos secos y semillas con sal. Frijoles enlatados con agregado de sal. Pescado enlatado o ahumado. Huevos enteros o yemas. Pollo o pavo con piel. Lcteos Leche entera o al 2%, crema y mitad leche y mitad crema. Queso crema entero o con toda su grasa. Yogur entero o endulzado. Quesos con toda su grasa. Sustitutos de cremas no lcteas. Coberturas batidas. Quesos para untar y quesos procesados. Grasas y [employer]. Margarina en barra. [place]. Materia grasa. Mantequilla clarificada. Grasa de panceta. Aceites tropicales como aceite de coco, palmiste o palma. Condimentos y otros alimentos Palomitas de maz y pretzels con sal. Sal de  cebolla, sal de ajo, sal condimentada, sal de mesa y sal marina. Salsa [place]. Salsa trtara. Salsa barbacoa. Salsa teriyaki. Salsa de soja, incluso la que tiene contenido reducido de [place]. Salsa de carne. Salsas en lata y envasadas. Salsa de pescado. Salsa de Gay. Salsa rosada. Rbano picante envasado. [place]. Mostaza. Saborizantes y  tiernizantes para carne. Caldo en cubitos. Salsa picante y salsa tabasco. Escabeches envasados o ya preparados. Aderezos para tacos prefabricados o envasados. Salsas. Aderezos comunes para ensalada. Dnde encontrar ms informacin:  [place], los Pulmones y Herbalist (National Heart, Lung, and [place]): https://wilson-eaton.com/  Asociacin Estadounidense del Corazn (American Heart Association): www.heart.org Resumen  El plan de alimentacin DASH ha demostrado bajar la presin arterial elevada (hipertensin). Tambin puede reducir UnitedHealth de diabetes tipo 2, enfermedad cardaca y accidente cerebrovascular.  Con el plan de alimentacin DASH, deber limitar el consumo de sal (sodio) a 2,300 mg por da. Si tiene hipertensin, es posible que necesite reducir la ingesta de sodio a 1,500 mg por da.  Cuando siga el plan de alimentacin DASH, trate de comer ms frutas frescas y verduras, cereales integrales, carnes magras, lcteos descremados y grasas cardiosaludables.  Trabaje con su mdico o especialista en alimentacin y nutricin (nutricionista) para ajustar su plan alimentario a sus necesidades calricas personales. Esta informacin no tiene Marine scientist el consejo del mdico. Asegrese de hacerle al mdico cualquier pregunta que tenga. Document Released: 06/03/2011 Document Revised: 10/04/2016 Document Reviewed: 10/04/2016 Elsevier Interactive Patient Education  2018 [place] ejercicio para Pharmacist, hospital (Exercising to [employer]) Hacer ejercicio puede ayudarlo a Sports coach de peso. Para bajar de [facility] ejercicio, este debe ser de intensidad vigorosa. Puede saber que est haciendo ejercicio de intensidad vigorosa si respira con mucha dificultad y rapidez, y no puede mantener una conversacin. El ejercicio de intensidad moderada ayuda a Contractor peso actual. Puede saber que est haciendo ejercicio de intensidad moderada si tiene una frecuencia  cardaca ms elevada y [place] respiracin ms rpida, pero an puede Programmer, systems. [place]? Elija una actividad que disfrute y establezca objetivos realistas. El mdico puede ayudarlo a Paediatric nurse un plan de actividades que funcione para usted. Haga ejercicio regularmente como se lo haya indicado el mdico. Esta puede incluir:  Neurosurgeon de [place] veces por semana, como: ? Flexiones de [employer]. ? Abdominales. ? Levantamiento de pesas. ? Ejercicios con bandas elsticas.  Realizar una intensidad determinada de ejercicio durante una cantidad determinada de [place]. Elija entre estas opciones: ? 14minutos de ejercicio de intensidad moderada cada semana. ? 39minutos de ejercicio de intensidad vigorosa cada semana. ? [employer] de ejercicio de intensidad moderada y vigorosa cada semana. Los nios, las mujeres [place], las personas que no estn en forma, las personas con sobrepeso y los adultos mayores tal vez tengan que consultar a un mdico para que les d Glass blower/designer. Si tiene [employer], asegrese de Teacher, adult education al mdico antes de comenzar un programa de ejercicios nuevo. [place]?  Caminar a un ritmo de al menos 4,47millas (7kilmetros) por hora.  Trotar o correr a un ritmo de 5 millas (8 kilmetros) por hora.  Andar en bicicleta a un ritmo de al menos 10 millas (16 kilmetros) por hora.  Practicar natacin.  Practicar patinaje sobre ruedas normales o en lnea.  Hacer esqu de fondo.  Hacer deportes competitivos vigorosos, como ftbol americano, bsquet y ftbol.  Saltar la soga.  Tomar clases de baile aerbico. CMO PUEDO SER MS ACTIVO EN MIS ACTIVIDADES DIARIAS?  Utilice las Clinical cytogeneticist del ascensor.  D una caminata durante su hora de almuerzo.  Si conduce, estacione el automvil ms lejos del trabajo o de la  escuela.  Si [place] transporte pblico, bjese una parada antes y camine el resto del camino.  Pngase de pie y camine cada vez que haga llamadas telefnicas.  Levntese, estrese y camine cada 32minutos a lo largo del Training and development officer. [place]?  No haga ejercicio en exceso que pudiera hacer que se lastime, se sienta mareado o tenga dificultad para respirar.  Consulte al mdico antes de comenzar un programa de ejercicios nuevo.  Use ropa cmoda y calzado con buen soporte.  Beba gran cantidad de agua mientras hace ejercicios para evitar la deshidratacin o los golpes de Freight forwarder. Durante la actividad fsica se pierde agua corporal que se debe reponer.  Haga ejercicio hasta que se acelere su respiracin y sus latidos cardacos. Esta informacin no tiene Marine scientist el consejo del mdico. Asegrese de hacerle al mdico cualquier pregunta que tenga. Document Released: 09/18/2010 Document Revised: 07/05/2014 Document Reviewed: 11/15/2013 Elsevier Interactive Patient Education  2018 [employer].  Exercising to [employer] Exercising can help you to lose weight. In order to lose weight through exercise, you need to do vigorous-intensity exercise. You can tell that you are exercising with vigorous intensity if you are breathing very hard and fast and cannot hold a conversation while exercising. Moderate-intensity exercise helps to maintain your current weight. You can tell that you are exercising at a moderate level if you have a higher heart rate and faster breathing, but you are still able to hold a conversation. How often should I exercise? Choose an activity that you enjoy and set realistic goals. Your health care provider can help you to make an activity plan that works for you. Exercise regularly as directed by your health care provider. This may include:  Doing resistance training twice each week, such as: ? Push-ups. ? Sit-ups. ? Lifting  weights. ? Using resistance bands.  Doing a given intensity of exercise for a given amount of time. Choose from these options: ? 150 minutes of moderate-intensity exercise every week. ? 75 minutes of vigorous-intensity exercise every week. ? A mix of moderate-intensity and vigorous-intensity exercise every week.  Children, pregnant women, people who are out of shape, people who are overweight, and older adults may need to consult a health care provider for individual recommendations. If you have any sort of medical condition, be sure to consult your health care provider before starting a new exercise program. What are some activities that can help me to lose weight?  Walking at a rate of at least 4.5 miles an hour.  Jogging or running at a rate of 5 miles per hour.  Biking at a rate of at least 10 miles per hour.  Lap swimming.  Roller-skating or in-line skating.  Cross-country skiing.  Vigorous competitive sports, such as football, basketball, and soccer.  Jumping rope.  Aerobic dancing. How can I be more active in my day-to-day activities?  Use the stairs instead of the elevator.  Take a walk during your lunch break.  If you drive, park your car farther away from work or school.  If you take public transportation, get off one stop early and walk the rest of the way.  Make all of your phone calls  while standing up and walking around.  Get up, stretch, and walk around every 30 minutes throughout the day. What guidelines should I follow while exercising?  Do not exercise so much that you hurt yourself, feel dizzy, or get very short of breath.  Consult your health care provider prior to starting a new exercise program.  Wear comfortable clothes and shoes with good support.  Drink plenty of water while you exercise to prevent dehydration or heat stroke. Body water is lost during exercise and must be replaced.  Work out until you breathe faster and your heart beats  faster. This information is not intended to replace advice given to you by your health care provider. Make sure you discuss any questions you have with your health care provider. Document Released: 07/17/2010 Document Revised: 11/20/2015 Document Reviewed: 11/15/2013 Elsevier Interactive Patient Education  2018 [place] With Diabetes Diabetes (type 1 diabetes mellitus or type 2 diabetes mellitus) is a condition in which the body does not have enough of a hormone called insulin, or the body does not respond properly to insulin. Normally, insulin allows sugars (glucose) to enter cells in the body. The cells use glucose for energy. With diabetes, extra glucose builds up in the blood instead of going into cells, which results in high blood glucose (hyperglycemia). How to manage lifestyle changes Managing diabetes includes medical treatments as well as lifestyle changes. If diabetes is not managed well, serious physical and emotional complications can occur. Taking good care of yourself means that you are responsible for:  Monitoring glucose regularly.  Eating a healthy diet.  Exercising regularly.  Meeting with health care providers.  Taking medicines as directed.  Some people may feel a lot of stress about managing their diabetes. This is known as emotional distress, and it is very common. Living with diabetes can place you at risk for emotional distress, depression, or anxiety. These disorders can be confusing and can make diabetes management more difficult. How to recognize stress Emotional distress Symptoms of emotional distress include:  Anger about having a diagnosis of diabetes.  Fear or frustration about your diagnosis and the changes you need to make to manage the condition.  Being overly worried about the care that you need or the cost of the care you need.  Feeling like you caused your condition by doing something wrong.  Fear of unpredictable situations, like  low or high blood glucose.  Feeling judged by your health care providers.  Feeling very alone with the disease.  Getting too tired or "burned out" with the demands of daily care.  Depression Having diabetes means that you are at a higher risk for depression. Having depression also means that you are at a higher risk for diabetes. Your health care provider may test (screen) you for symptoms of depression. It is important to recognize depression symptoms and to start treatment for it soon after it is diagnosed. The following are some symptoms of depression:  Loss of interest in things that you used to enjoy.  Trouble sleeping, or often waking up early and not being able to get back to sleep.  A change in appetite.  Feeling tired most of the day.  Feeling nervous and anxious.  Feeling guilty and worrying that you are a burden to others.  Feeling depressed more often than you do not feel that way.  Thoughts of hurting yourself or feeling that you want to die.  If you have any of these symptoms for 2 weeks  or longer, reach out to a health care provider. Where to find support  Ask your health care provider to recommend a therapist who understands both depression and diabetes.  Search for information and support from the American Diabetes Association: www.diabetes.org  Find a certified diabetes educator and make an appointment through [place] of Diabetes Educators: www.diabeteseducator.org Follow these instructions at home: Managing emotional distress The following are some ways to manage emotional distress:  Talk with your health care provider or certified diabetes educator. Consider working with a counselor or therapist.  Learn as much as you can about diabetes and its treatment. Meet with a certified diabetes educator or take a class to learn how to manage your condition.  Keep a journal of your thoughts and concerns.  Accept that some things are out of your  control.  Talk with other people who have diabetes. It can help to talk with others about the emotional distress that you feel.  Find ways to manage stress that work for you. These may include art or music therapy, exercise, meditation, and hobbies.  Seek support from spiritual leaders, family, and friends.  General instructions  Follow your diabetes management plan.  Keep all follow-up visits as told by your health care provider. This is important. Get help right away if:  You have thoughts about hurting yourself or others. If you ever feel like you may hurt yourself or others, or have thoughts about taking your own life, get help right away. You can go to your nearest emergency department or call:  Your local emergency services (911 in the [place]  A suicide crisis helpline, such as the [place] at (325)386-0134. This is open 24 hours a day.  Summary  Diabetes (type 1 diabetes mellitus or type 2 diabetes mellitus) is a condition in which the body does not have enough of a hormone called insulin, or the body does not respond properly to insulin.  Living with diabetes puts you at risk for medical issues, and it also puts you at risk for emotional issues such as emotional distress, depression, and anxiety.  Recognizing the symptoms of emotional distress and depression may help you avoid problems with your diabetes control. It is important to start treatment for emotional distress and depression soon after they are diagnosed.  Having diabetes means that you are at a higher risk for depression. Ask your health care provider to recommend a therapist who understands both depression and diabetes.  If you experience symptoms of emotional distress or depression, it is important to discuss this with your health care provider, certified diabetes educator, or therapist. This information is not intended to replace advice given to you by your health care provider. Make  sure you discuss any questions you have with your health care provider. Document Released: 10/28/2016 Document Revised: 10/28/2016 Document Reviewed: 10/28/2016 Elsevier Interactive Patient Education  2018 [place] Maintenance, Male A healthy lifestyle and preventive care is important for your health and wellness. Ask your health care provider about what schedule of regular examinations is right for you. What should I know about weight and diet? Eat a Healthy Diet  Eat plenty of vegetables, fruits, whole grains, low-fat dairy products, and lean protein.  Do not eat a lot of foods high in solid fats, added sugars, or salt.  Maintain a Healthy Weight Regular exercise can help you achieve or maintain a healthy weight. You should:  Do at least 150 minutes of exercise each week. The exercise  should increase your heart rate and make you sweat (moderate-intensity exercise).  Do strength-training exercises at least twice a week.  Watch Your Levels of Cholesterol and Blood Lipids  Have your blood tested for lipids and cholesterol every 5 years starting at 62 years of age. If you are at high risk for heart disease, you should start having your blood tested when you are 62 years old. You may need to have your cholesterol levels checked more often if: ? Your lipid or cholesterol levels are high. ? You are older than 62 years of age. ? You are at high risk for heart disease.  What should I know about cancer screening? Many types of cancers can be detected early and may often be prevented. Lung Cancer  You should be screened every year for lung cancer if: ? You are a current smoker who has smoked for at least 30 years. ? You are a former smoker who has quit within the past 15 years.  Talk to your health care provider about your screening options, when you should start screening, and how often you should be screened.  Colorectal Cancer  Routine colorectal cancer screening  usually begins at 62 years of age and should be repeated every 5-10 years until you are 62 years old. You may need to be screened more often if early forms of precancerous polyps or small growths are found. Your health care provider may recommend screening at an earlier age if you have risk factors for colon cancer.  Your health care provider may recommend using home test kits to check for hidden blood in the stool.  A small camera at the end of a tube can be used to examine your colon (sigmoidoscopy or colonoscopy). This checks for the earliest forms of colorectal cancer.  Prostate and Testicular Cancer  Depending on your age and overall health, your health care provider may do certain tests to screen for prostate and testicular cancer.  Talk to your health care provider about any symptoms or concerns you have about testicular or prostate cancer.  Skin Cancer  Check your skin from head to toe regularly.  Tell your health care provider about any new moles or changes in moles, especially if: ? There is a change in a mole's size, shape, or color. ? You have a mole that is larger than a pencil eraser.  Always use sunscreen. Apply sunscreen liberally and repeat throughout the day.  Protect yourself by wearing long sleeves, pants, a wide-brimmed hat, and sunglasses when outside.  What should I know about heart disease, diabetes, and high blood pressure?  If you are 58-57 years of age, have your blood pressure checked every 3-5 years. If you are 23 years of age or older, have your blood pressure checked every year. You should have your blood pressure measured twice-once when you are at a hospital or clinic, and once when you are not at a hospital or clinic. Record the average of the two measurements. To check your blood pressure when you are not at a hospital or clinic, you can use: ? An automated blood pressure machine at a pharmacy. ? A home blood pressure monitor.  Talk to your health care  provider about your target blood pressure.  If you are between 1-26 years old, ask your health care provider if you should take aspirin to prevent heart disease.  Have regular diabetes screenings by checking your fasting blood sugar level. ? If you are at a normal weight and  have a low risk for diabetes, have this test once every three years after the age of 43. ? If you are overweight and have a high risk for diabetes, consider being tested at a younger age or more often.  A one-time screening for abdominal aortic aneurysm (AAA) by ultrasound is recommended for men aged 3-75 years who are current or former smokers. What should I know about preventing infection? Hepatitis B If you have a higher risk for hepatitis B, you should be screened for this virus. Talk with your health care provider to find out if you are at risk for hepatitis B infection. Hepatitis C Blood testing is recommended for:  Everyone born from [birth year] through 1965.  Anyone with known risk factors for hepatitis C.  Sexually Transmitted Diseases (STDs)  You should be screened each year for STDs including gonorrhea and chlamydia if: ? You are sexually active and are younger than 62 years of age. ? You are older than 62 years of age and your health care provider tells you that you are at risk for this type of infection. ? Your sexual activity has changed since you were last screened and you are at an increased risk for chlamydia or gonorrhea. Ask your health care provider if you are at risk.  Talk with your health care provider about whether you are at high risk of being infected with HIV. Your health care provider may recommend a prescription medicine to help prevent HIV infection.  What else can I do?  Schedule regular health, dental, and eye exams.  Stay current with your vaccines (immunizations).  Do not use any tobacco products, such as cigarettes, chewing tobacco, and e-cigarettes. If you need help quitting, ask  your health care provider.  Limit alcohol intake to no more than 2 drinks per day. One drink equals 12 ounces of beer, 5 ounces of wine, or 1 ounces of hard liquor.  Do not use street drugs.  Do not share needles.  Ask your health care provider for help if you need support or information about quitting drugs.  Tell your health care provider if you often feel depressed.  Tell your health care provider if you have ever been abused or do not feel safe at home. This information is not intended to replace advice given to you by your health care provider. Make sure you discuss any questions you have with your health care provider. Document Released: 12/11/2007 Document Revised: 02/11/2016 Document Reviewed: 03/18/2015 Elsevier Interactive Patient Education  2018 [place]. Insulin Glargine injection What is this medicine? INSULIN GLARGINE (IN su lin GLAR geen) is a human-made form of insulin. This drug lowers the amount of sugar in your blood. It is a long-acting insulin that is usually given once a day. This medicine may be used for other purposes; ask your health care provider or pharmacist if you have questions. COMMON BRAND NAME(S): BASAGLAR, Lantus, Lantus SoloStar, Toujeo SoloStar What should I tell my health care provider before I take this medicine? They need to know if you have any of these conditions: -episodes of low blood sugar -kidney disease -liver disease -an unusual or allergic reaction to insulin, metacresol, other medicines, foods, dyes, or preservatives -pregnant or trying to get pregnant -breast-feeding How should I use this medicine? This medicine is for injection under the skin. Use this medicine at the same time each day. Use exactly as directed. This insulin should never be mixed in the same syringe with other insulins before injection. Do not  vigorously shake before use. You will be taught how to use this medicine and how to adjust doses for activities and  illness. Do not use more insulin than prescribed. Always check the appearance of your insulin before using it. This medicine should be clear and colorless like water. Do not use it if it is cloudy, thickened, colored, or has solid particles in it. It is important that you put your used needles and syringes in a special sharps container. Do not put them in a trash can. If you do not have a sharps container, call your pharmacist or healthcare provider to get one. Talk to your pediatrician regarding the use of this medicine in children. Special care may be needed. Overdosage: If you think you have taken too much of this medicine contact a poison control center or emergency room at once. NOTE: This medicine is only for you. Do not share this medicine with others. What if I miss a dose? It is important not to miss a dose. Your health care professional or doctor should discuss a plan for missed doses with you. If you do miss a dose, follow their plan. Do not take double doses. What may interact with this medicine? -other medicines for diabetes Many medications may cause changes in blood sugar, these include: -alcohol containing beverages -antiviral medicines for HIV or AIDS -aspirin and aspirin-like drugs -certain medicines for blood pressure, heart disease, irregular heart beat -chromium -diuretics -male hormones, such as estrogens or progestins, birth control pills -fenofibrate -gemfibrozil -isoniazid -lanreotide -male hormones or anabolic steroids -MAOIs like Carbex, Eldepryl, Marplan, Nardil, and Parnate -medicines for weight loss -medicines for allergies, asthma, cold, or cough -medicines for depression, anxiety, or psychotic disturbances -niacin -nicotine -NSAIDs, medicines for pain and inflammation, like ibuprofen or naproxen -octreotide -pasireotide -pentamidine -phenytoin -probenecid -quinolone antibiotics such as ciprofloxacin, levofloxacin, ofloxacin -some herbal dietary  supplements -steroid medicines such as prednisone or cortisone -sulfamethoxazole; trimethoprim -thyroid hormones Some medications can hide the warning symptoms of low blood sugar (hypoglycemia). You may need to monitor your blood sugar more closely if you are taking one of these medications. These include: -beta-blockers, often used for high blood pressure or heart problems (examples include atenolol, metoprolol, propranolol) -clonidine -guanethidine -reserpine This list may not describe all possible interactions. Give your health care provider a list of all the medicines, herbs, non-prescription drugs, or dietary supplements you use. Also tell them if you smoke, drink alcohol, or use illegal drugs. Some items may interact with your medicine. What should I watch for while using this medicine? Visit your health care professional or doctor for regular checks on your progress. Do not drive, use machinery, or do anything that needs mental alertness until you know how this medicine affects you. Alcohol may interfere with the effect of this medicine. Avoid alcoholic drinks. A test called the HbA1C (A1C) will be monitored. This is a simple blood test. It measures your blood sugar control over the last 2 to 3 months. You will receive this test every 3 to 6 months. Learn how to check your blood sugar. Learn the symptoms of low and high blood sugar and how to manage them. Always carry a quick-source of sugar with you in case you have symptoms of low blood sugar. Examples include hard sugar candy or glucose tablets. Make sure others know that you can choke if you eat or drink when you develop serious symptoms of low blood sugar, such as seizures or unconsciousness. They must get medical help at once.  Tell your doctor or health care professional if you have high blood sugar. You might need to change the dose of your medicine. If you are sick or exercising more than usual, you might need to change the dose of  your medicine. Do not skip meals. Ask your doctor or health care professional if you should avoid alcohol. Many nonprescription cough and cold products contain sugar or alcohol. These can affect blood sugar. Make sure that you have the right kind of syringe for the type of insulin you use. Try not to change the brand and type of insulin or syringe unless your health care professional or doctor tells you to. Switching insulin brand or type can cause dangerously high or low blood sugar. Always keep an extra supply of insulin, syringes, and needles on hand. Use a syringe one time only. Throw away syringe and needle in a closed container to prevent accidental needle sticks. Insulin pens and cartridges should never be shared. Even if the needle is changed, sharing may result in passing of viruses like hepatitis or HIV. Wear a medical ID bracelet or chain, and carry a card that describes your disease and details of your medicine and dosage times. What side effects may I notice from receiving this medicine? Side effects that you should report to your doctor or health care professional as soon as possible: -allergic reactions like skin rash, itching or hives, swelling of the face, lips, or tongue -breathing problems -signs and symptoms of high blood sugar such as dizziness, dry mouth, dry skin, fruity breath, nausea, stomach pain, increased hunger or thirst, increased urination -signs and symptoms of low blood sugar such as feeling anxious, confusion, dizziness, increased hunger, unusually weak or tired, sweating, shakiness, cold, irritable, headache, blurred vision, fast heartbeat, loss of consciousness Side effects that usually do not require medical attention (report to your doctor or health care professional if they continue or are bothersome): -increase or decrease in fatty tissue under the skin due to overuse of a particular injection site -itching, burning, swelling, or rash at site where injected This  list may not describe all possible side effects. Call your doctor for medical advice about side effects. You may report side effects to FDA at 1-800-FDA-1088. Where should I keep my medicine? Keep out of the reach of children. Store unopened vials in a refrigerator between 2 and 8 degrees C (36 and 46 degrees F). Do not freeze or use if the insulin has been frozen. Opened vials (vials currently in use) may be stored in the refrigerator or at room temperature, at approximately 25 degrees C (77 degrees F) or cooler. Keeping your insulin at room temperature decreases the amount of pain during injection. Once opened, your insulin can be used for 28 days. After 28 days, the vial should be thrown away. Store Lantus [employer], mining in a refrigerator between 2 and 8 degrees C (36 and 46 degrees F) or at room temperature below 30 degrees C (86 degrees F). Do not freeze or use if the insulin has been frozen. Once opened, the pens should be kept at room temperature. Do not store in the refrigerator once opened. Once opened, the insulin can be used for 28 days. After 28 days, the Lantus Solostar Pen or Basaglar KwikPen should be thrown away. Store [employer] in a refrigerator between 2 and 8 degrees C (36 and 46 degrees F). Do not freeze or use if the insulin has been frozen. Once opened, the pens  should be kept at room temperature below 30 degrees C (86 degrees F). Do not store in the refrigerator once opened. Once opened, the insulin can be used for 42 days. After 42 days, the [employer] Pen should be thrown away. Protect from light and excessive heat. Throw away any unused medicine after the expiration date or after the specified time for room temperature storage has passed. NOTE: This sheet is a summary. It may not cover all possible information. If you have questions about this medicine, talk to your doctor, pharmacist, or health care provider.  2018 Elsevier/Gold Standard  (2016-06-30 10:26:25)

## 2017-11-02 NOTE — Progress Notes (Addendum)
Subjective:  Patient ID: Leonard [name], male    DOB: 1956-06-23  Age: 62 y.o. MRN: [id number]  CC: Establish Care   HPI [facility] presents for follow-up of his diabetes, hypertension and hyperlipidemia.  Last measured hemoglobin A1c was 7.4 on Actos, Victoza, glipizide and Janumet.  Blood pressures controlled with Lotrel.  He is taking HCTZ per urology for kidney stones.  Cholesterol has been controlled with Lipitor 20 mg daily.  He has no history of stroke or heart attack.  He does not drinks smoke or use illicit drugs he lives with his wife.  He stays active caring for their yard, her veterinary clinic and a property up in the [place].  He is retired.  Status post colonoscopy in 2018.  Dilated eye exam last week.  He tells me it was negative for retinopathy.  He does exercise riding a bike in weightlifting 3 to 5 days/week.  Urology is following his prostate.  He sees psychiatry for complicated history of anxiety depression and ADHD.  He is planning on weight loss with a low carbohydrate diet but he does admit to losing and gaining weight back over the years.  Patient reports that his a.m. blood sugars are typically running around 200. Outpatient Medications Prior to Visit  Medication Sig Dispense Refill  . allopurinol (ZYLOPRIM) 100 MG tablet Take 100 mg by mouth daily before breakfast.     . amLODipine-benazepril (LOTREL) 10-40 MG capsule Take 1 capsule by mouth at bedtime. 30 capsule 1  . amphetamine-dextroamphetamine (ADDERALL) 10 MG tablet Take 10 mg by mouth 2 (two) times daily with a meal.     . aspirin 81 MG tablet Take 81 mg by mouth daily.    [name] atorvastatin (LIPITOR) 20 MG tablet Take 1 tablet (20 mg total) by mouth daily. 30 tablet 1  . Blood Glucose Monitoring Suppl (ONE TOUCH ULTRA SYSTEM KIT) w/Device KIT 1 kit by Does not apply route once. 1 each 0  . escitalopram (LEXAPRO) 10 MG tablet Take 3 tablets (30 mg total) by mouth daily.    [name] FLUoxetine (PROZAC) 10 MG  capsule Take 1 capsule by mouth daily.    . fluticasone (FLONASE) 50 MCG/ACT nasal spray PLACE TWO SPRAYS INTO BOTH NOSTRILS DAILY. 16 g 2  . glucose blood (ONE TOUCH ULTRA TEST) test strip CHECK BLOOD SUGAR NO MORE THAN TWICE DAILY 100 each 1  . hydrochlorothiazide (HYDRODIURIL) 25 MG tablet Take 25 mg by mouth daily before breakfast.    . Insulin Pen Needle (BD PEN NEEDLE NANO U/F) 32G X 4 MM MISC TO USE WITH VICTOZA 100 each 1  . Lancets (ONETOUCH ULTRASOFT) lancets Check blood sugar no more than twice daily 100 each 12  . Multiple Vitamin (MULTIVITAMIN WITH MINERALS) TABS Take 1 tablet by mouth daily.    . pioglitazone (ACTOS) 30 MG tablet Take 1 tablet (30 mg total) by mouth daily. 30 tablet 1  . potassium citrate (UROCIT-K) 10 MEQ (1080 MG) SR tablet Take 10 mEq by mouth 2 (two) times daily.     . sitaGLIPtin-metformin (JANUMET) 50-1000 MG tablet Take 1 tablet by mouth 2 (two) times daily with a meal. 60 tablet 1  . tamsulosin (FLOMAX) 0.4 MG CAPS Take 0.4 mg by mouth at bedtime.    . traZODone (DESYREL) 50 MG tablet Take 1 tablet by mouth at bedtime.    . fish oil-omega-3 fatty acids 1000 MG capsule Take 2 g by mouth 2 (two) times daily.    [name]  glipiZIDE (GLUCOTROL) 10 MG tablet Take 1 tablet (10 mg total) by mouth 2 (two) times daily before a meal. 60 tablet 0  . liraglutide (VICTOZA) 18 MG/3ML SOPN Inject 0.3 mLs (1.8 mg total) into the skin daily. 9 mL 0  . Ascorbic Acid (VITAMIN C) 1000 MG tablet Take 1,000 mg by mouth daily. Reported on 08/11/2015    . dextroamphetamine (DEXTROSTAT) 5 MG tablet Take 5 mg by mouth daily.     No facility-administered medications prior to visit.     ROS Review of Systems  Constitutional: Negative.   HENT: Negative.   Eyes: Negative.  Negative for photophobia and visual disturbance.  Respiratory: Negative.   Cardiovascular: Negative.   Gastrointestinal: Negative.   Endocrine: Negative for polyphagia and polyuria.  Genitourinary: Negative for  difficulty urinating, frequency and urgency.  Musculoskeletal: Negative for gait problem.  Skin: Negative.   Allergic/Immunologic: Negative for immunocompromised state.  Neurological: Negative for weakness and headaches.  Hematological: Negative.   Psychiatric/Behavioral: Negative.     Objective:  BP 134/80   Pulse 83   Ht 6' (1.829 m)   Wt 269 lb 4 oz (122.1 kg)   SpO2 98%   BMI 36.52 kg/m   BP Readings from Last 3 Encounters:  11/02/17 134/80  07/06/17 137/81  04/12/17 124/72    Wt Readings from Last 3 Encounters:  11/02/17 269 lb 4 oz (122.1 kg)  08/25/17 279 lb (126.6 kg)  07/06/17 279 lb (126.6 kg)    Physical Exam  Constitutional: He is oriented to person, place, and time. He appears well-developed and well-nourished. No distress.  HENT:  Head: Normocephalic and atraumatic.  Right Ear: External ear normal.  Left Ear: External ear normal.  Nose: Nose normal.  Mouth/Throat: Oropharynx is clear and moist. No oropharyngeal exudate.  Eyes: Pupils are equal, round, and reactive to light. Conjunctivae and EOM are normal. Right eye exhibits no discharge. Left eye exhibits no discharge. No scleral icterus.  Neck: Normal range of motion. Neck supple. No JVD present. No tracheal deviation present. No thyromegaly present.    Cardiovascular: Normal rate, regular rhythm and normal heart sounds.  Pulmonary/Chest: Effort normal and breath sounds normal.  Abdominal: Soft. Bowel sounds are normal.  Lymphadenopathy:    He has no cervical adenopathy.  Neurological: He is alert and oriented to person, place, and time.  Skin: Skin is warm and dry. No rash noted. He is not diaphoretic. No erythema. No pallor.  Psychiatric: He has a normal mood and affect. His behavior is normal.    Lab Results  Component Value Date   WBC 7.1 11/02/2017   HGB 14.0 11/02/2017   HCT 40.6 11/02/2017   PLT 319.0 11/02/2017   GLUCOSE 109 (H) 11/02/2017   CHOL 137 11/02/2017   TRIG 204.0 (H)  11/02/2017   HDL 38.30 (L) 11/02/2017   LDLDIRECT 66.0 11/02/2017   LDLCALC 69 07/09/2016   ALT 27 11/02/2017   AST 22 11/02/2017   NA 138 11/02/2017   K 4.0 11/02/2017   CL 104 11/02/2017   CREATININE 1.11 11/02/2017   BUN 22 11/02/2017   CO2 24 11/02/2017   TSH 0.97 07/09/2016   PSA 0.51 12/25/2008   INR 1.13 12/11/2014   HGBA1C 7.6 (H) 11/02/2017   MICROALBUR 30.8 (H) 11/02/2017    Mr Lumbar Spine Wo Contrast  Result Date: 05/10/2015 CLINICAL DATA:  Low back pain. BILATERAL feet numbness. Symptom duration for years. EXAM: MRI LUMBAR SPINE WITHOUT CONTRAST TECHNIQUE: Multiplanar, multisequence MR imaging  of the lumbar spine was performed. No intravenous contrast was administered. COMPARISON:  10/25/2009. FINDINGS: Segmentation: Normal. Alignment:  Normal. Vertebrae: No worrisome osseous lesion. Conus medullaris: Normal in size, signal, and location. Paraspinal tissues: No evidence for hydronephrosis or paravertebral mass. Disc levels: L1-L2:  Normal. L2-L3:  Normal. L3-L4: Central and leftward protrusion. Mild facet arthropathy. LEFT subarticular zone and foraminal zone narrowing could affect the L3 and L4 nerve roots. L4-L5: Central and rightward protrusion. Mild facet arthropathy. RIGHT subarticular zone and foraminal zone narrowing could affect the L4 and L5 nerve roots. L5-S1: Shallow central protrusion. Central annular rent projects between both S1 nerve roots. No impingement. Compared with priors, a similar appearance is noted. IMPRESSION: Central and leftward protrusion at L3-4 extends to the foramen. Similarly central and rightward protrusion at L4-5 extends to the foramen. Symptomatic neural impingement could occur at either or both levels. Electronically Signed   By: Staci Righter M.D.   On: 05/10/2015 09:17    Assessment & Plan:   Advit was seen today for establish care.  Diagnoses and all orders for this visit:  Essential hypertension -     CBC -     Comprehensive  metabolic panel -     Urinalysis, Routine w reflex microscopic -     Microalbumin / creatinine urine ratio  Controlled type 2 diabetes mellitus without complication, without long-term current use of insulin (HCC) -     CBC -     Comprehensive metabolic panel -     Hemoglobin A1c -     Urinalysis, Routine w reflex microscopic -     Microalbumin / creatinine urine ratio -     Insulin Glargine (BASAGLAR KWIKPEN) 100 UNIT/ML SOPN; Inject 0.15 mLs (15 Units total) into the skin at bedtime.  Mixed hyperlipidemia -     Comprehensive metabolic panel -     Lipid panel  Morbid obesity ([place])  Other orders -     LDL cholesterol, direct   I have discontinued Aidenn W. Winch's fish oil-omega-3 fatty acids, vitamin C, dextroamphetamine, glipiZIDE, and liraglutide. I am also having him start on [facility]. Additionally, I am having him maintain his allopurinol, hydrochlorothiazide, multivitamin with minerals, potassium citrate, tamsulosin, aspirin, ONE TOUCH ULTRA SYSTEM KIT, onetouch ultrasoft, escitalopram, amphetamine-dextroamphetamine, traZODone, fluticasone, amLODipine-benazepril, pioglitazone, atorvastatin, Insulin Pen Needle, sitaGLIPtin-metformin, glucose blood, and FLUoxetine.  Meds ordered this encounter  Medications  . Insulin Glargine (BASAGLAR KWIKPEN) 100 UNIT/ML SOPN    Sig: Inject 0.15 mLs (15 Units total) into the skin at bedtime.    Dispense:  1 pen    Refill:  12   Patient is taking 4 medicines for his diabetes that include Victoza with Janumet.  Since the last 2 meds have a similar mechanism of action,, will discontinue the Victoza and start patient on Lantus 15 units nightly.  He will adjust his Lantus up by 4 units nightly until fasting blood sugar of less than 150 is reached.  He will discontinue his glipizide.  He will follow-up in 2 weeks.  Patient is concerned about the mass in his right posterior neck.  He is most consistent with a cyst or possibly a lymph node.   Told him that he will need to be followed and referred to general surgery for possible excision with any change at all. Follow-up: Return in about 2 weeks (around 11/16/2017).  Libby Maw, MD

## 2017-11-03 NOTE — Addendum Note (Signed)
Addended by: Abelino Derrick A on: 11/03/2017 12:08 PM   Modules accepted: Orders

## 2017-11-16 ENCOUNTER — Ambulatory Visit: Payer: BC Managed Care – PPO | Admitting: Family Medicine

## 2017-11-16 ENCOUNTER — Telehealth: Payer: Self-pay

## 2017-11-16 ENCOUNTER — Encounter: Payer: Self-pay | Admitting: Family Medicine

## 2017-11-16 VITALS — BP 128/90 | HR 76 | Temp 97.5°F | Ht 72.0 in | Wt 273.5 lb

## 2017-11-16 DIAGNOSIS — K429 Umbilical hernia without obstruction or gangrene: Secondary | ICD-10-CM

## 2017-11-16 DIAGNOSIS — E1165 Type 2 diabetes mellitus with hyperglycemia: Secondary | ICD-10-CM

## 2017-11-16 MED ORDER — EXENATIDE ER 2 MG ~~LOC~~ PEN: PEN_INJECTOR | SUBCUTANEOUS | 6 refills | Status: DC

## 2017-11-16 MED ORDER — METFORMIN HCL 1000 MG PO TABS
1000.0000 mg | ORAL_TABLET | Freq: Two times a day (BID) | ORAL | 3 refills | Status: DC
Start: 2017-11-16 — End: 2018-12-13

## 2017-11-16 MED ORDER — DULAGLUTIDE 0.75 MG/0.5ML ~~LOC~~ SOAJ: SUBCUTANEOUS | 6 refills | Status: DC

## 2017-11-16 NOTE — Telephone Encounter (Signed)
Leonard [name] is not covered. Insurance will pay for [employer], Trulicity or Victoza.

## 2017-11-16 NOTE — Patient Instructions (Signed)
Exenatide injection suspension extended-release (autoinjector) What is this medicine? EXENATIDE (ex EN a tide) is used to improve blood sugar control in adults with type 2 diabetes. This medicine may be used with other oral diabetes medicines. This medicine may be used for other purposes; ask your health care provider or pharmacist if you have questions. COMMON BRAND NAME(S): Bydureon BCise What should I tell my health care provider before I take this medicine? They need to know if you have any of these conditions: -endocrine tumors (MEN 2) or if someone in your family had these tumors -history of pancreatitis -kidney disease or if you are on dialysis -stomach or intestine problems -thyroid cancer or if someone in your family had thyroid cancer -an unusual or allergic reaction to exenatide, medicines, foods, dyes, or preservatives -pregnant or trying to get pregnant -breast-feeding How should I use this medicine? This medicine is for injection under the skin of your upper leg, stomach area, or upper arm. It is usually given once every week (every 7 days). You will be taught how to prepare and give this medicine. Use exactly as directed. Take your medicine at regular intervals. Do not take it more often than directed. It is important that you put your used autoinjectors in a special sharps container. Do not put them in a trash can. If you do not have a sharps container, call your pharmacist or healthcare provider to get one. A special MedGuide will be given to you by the pharmacist with each prescription and refill. Be sure to read this information carefully each time. Talk to your pediatrician regarding the use of this medicine in children. Special care may be needed. Overdosage: If you think you have taken too much of this medicine contact a poison control center or emergency room at once. NOTE: This medicine is only for you. Do not share this medicine with others. What if I miss a dose? If  you miss a dose, take it as soon as you can, provided your next usual scheduled dose is due at least 3 days later. If you miss a dose and your next usual scheduled dose is due 1 or 2 days later, then do not take the missed dose. Take the next dose at your regular time. Do not take double or extra doses. If you have questions about a missed dose, contact your health care provider for advice. What may interact with this medicine? -acetaminophen -birth control pills -digoxin -insulin and other medicines for diabetes -lisinopril -lovastatin -warfarin Many medications may cause changes in blood sugar, these include: -alcohol containing beverages -antiviral medicines for HIV or AIDS -aspirin and aspirin-like drugs -certain medicines for blood pressure, heart disease, irregular heart beat -certain medicines for cholesterol like fenofibrate, gemfibrozil -chromium -diuretics -male hormones, such as estrogens or progestins, birth control pills -isoniazid -lanreotide -male hormones or anabolic steroids -MAOIs like Carbex, Eldepryl, Marplan, Nardil, and Parnate -medicines for allergies, asthma, cold, or cough -medicines for depression, anxiety, or psychotic disturbances -medicines for weight loss -niacin -nicotine -NSAIDs, medicines for pain and inflammation, like ibuprofen or naproxen -octreotide -pasireotide -pentamidine -phenytoin -probenecid -quinolone antibiotics such as ciprofloxacin, levofloxacin, ofloxacin -some herbal dietary supplements -steroid medicines such as prednisone or cortisone -sulfamethoxazole; trimethoprim -thyroid hormones Some medications can hide the warning symptoms of low blood sugar (hypoglycemia). You may need to monitor your blood sugar more closely if you are taking one of these medications. These include: -beta-blockers, often used for high blood pressure or heart problems (examples include atenolol, metoprolol,  propranolol) -clonidine -guanethidine -reserpine This list may not describe all possible interactions. Give your health care provider a list of all the medicines, herbs, non-prescription drugs, or dietary supplements you use. Also tell them if you smoke, drink alcohol, or use illegal drugs. Some items may interact with your medicine. What should I watch for while using this medicine? Visit your doctor or health care professional for regular checks on your progress. A test called the HbA1C (A1C) will be monitored. This is a simple blood test. It measures your blood sugar control over the last 2 to 3 months. You will receive this test every 3 to 6 months. Learn how to check your blood sugar. Learn the symptoms of low and high blood sugar and how to manage them. Always carry a quick-source of sugar with you in case you have symptoms of low blood sugar. Examples include hard sugar candy or glucose tablets. Make sure others know that you can choke if you eat or drink when you develop serious symptoms of low blood sugar, such as seizures or unconsciousness. They must get medical help at once. Tell your doctor or health care professional if you have high blood sugar. You might need to change the dose of your medicine. If you are sick or exercising more than usual, you might need to change the dose of your medicine. Do not skip meals. Ask your doctor or health care professional if you should avoid alcohol. Many nonprescription cough and cold products contain sugar or alcohol. These can affect blood sugar. Autoinjectors should never be shared. Sharing may result in passing of viruses like hepatitis or HIV. Wear a medical ID bracelet or chain, and carry a card that describes your disease and details of your medicine and dosage times. What side effects may I notice from receiving this medicine? Side effects that you should report to your doctor or health care professional as soon as possible: -allergic  reactions like skin rash, itching or hives, swelling of the face, lips, or tongue -breathing problems -diarrhea that continues or is severe -lump or swelling on the neck -severe nausea -signs and symptoms of low blood sugar such as feeling anxious, confusion, dizziness, increased hunger, unusually weak or tired, sweating, shakiness, cold, irritable, headache, blurred vision, fast heartbeat, loss of consciousness -signs and symptoms of kidney injury like trouble passing urine or change in the amount of urine -trouble swallowing -unusual stomach upset or pain -vomiting Side effects that usually do not require medical attention (report these to your doctor or health care professional if they continue or are bothersome): -constipation -diarrhea -dizziness -headache -nausea -pain, redness, or irritation at site where injected -stomach upset This list may not describe all possible side effects. Call your doctor for medical advice about side effects. You may report side effects to FDA at 1-800-FDA-1088. Where should I keep my medicine? Keep out of the reach of children. Store this medicine flat in a refrigerator between 2 and 8 degrees C (36 and 46 degrees F). Do not freeze. Do not use if the medicine has been frozen. Protect from light and excessive heat. Remove from the refrigerator 15 minutes before use. The autoinjector can be kept at room temperature not to exceed 30 degrees C (86 degrees F) for no more than a total of 4 weeks, if needed. Throw away any unused medicine after the expiration date on the label. NOTE: This sheet is a summary. It may not cover all possible information. If you have questions about this medicine,  talk to your doctor, pharmacist, or health care provider.  2018 Elsevier/Gold Standard (2016-07-02 13:46:10)

## 2017-11-16 NOTE — Telephone Encounter (Signed)
Patient is aware 

## 2017-11-16 NOTE — Telephone Encounter (Signed)
RX for trulicity sent to pharm.

## 2017-11-16 NOTE — Addendum Note (Signed)
Addended by: Jon Billings on: 11/16/2017 11:46 AM   Modules accepted: Orders

## 2017-11-16 NOTE — Progress Notes (Addendum)
Subjective:  Patient ID: Leonard [name], male    DOB: [birth year]  Age: 62 y.o. MRN: [id number]  CC: Follow-up   HPI Leonard [name] presents for follow-up of his diabetes status post starting Basaglar.  He tells that his sugars remain elevated in the morning until he had increased the Basaglar to 30 units nightly.  He then started seeing fasting blood sugars in the 120 range.  He also had realized that he was eating at 9:00 at night.  He asked if he could continue to use the glipizide on a as needed basis and I explained that due to its mechanism of action this would not be a great idea.  We discussed also by durian weekly injections and he was amenable to giving that a try.  He has a bulge around his umbilicus that he would like me to take a look at.  He is having no abdominal pain nausea or vomiting constipation.  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  Outpatient Medications Prior to Visit  Medication Sig Dispense Refill  . allopurinol (ZYLOPRIM) 100 MG tablet Take 100 mg by mouth daily before breakfast.     . amLODipine-benazepril (LOTREL) 10-40 MG capsule Take 1 capsule by mouth at bedtime. 30 capsule 1  . amphetamine-dextroamphetamine (ADDERALL) 10 MG tablet Take 10 mg by mouth 2 (two) times daily with a meal.     . aspirin 81 MG tablet Take 81 mg by mouth daily.    [name] atorvastatin (LIPITOR) 20 MG tablet Take 1 tablet (20 mg total) by mouth daily. 30 tablet 1  . Blood Glucose Monitoring Suppl (ONE TOUCH ULTRA SYSTEM KIT) w/Device KIT 1 kit by Does not apply route once. 1 each 0  . escitalopram (LEXAPRO) 10 MG tablet Take 3 tablets (30 mg total) by mouth daily.    [name] FLUoxetine (PROZAC) 10 MG capsule Take 1 capsule by mouth daily.    [name] glucose blood (ONE TOUCH ULTRA TEST) test strip CHECK BLOOD SUGAR NO MORE THAN TWICE DAILY 100 each 1  . hydrochlorothiazide (HYDRODIURIL) 25 MG tablet Take 25 mg by mouth daily before breakfast.    . Insulin  Glargine (BASAGLAR KWIKPEN) 100 UNIT/ML SOPN Inject 0.15 mLs (15 Units total) into the skin at bedtime. 1 pen 12  . Insulin Pen Needle (BD PEN NEEDLE NANO U/F) 32G X 4 MM MISC TO USE WITH VICTOZA 100 each 1  . Lancets (ONETOUCH ULTRASOFT) lancets Check blood sugar no more than twice daily 100 each 12  . Multiple Vitamin (MULTIVITAMIN WITH MINERALS) TABS Take 1 tablet by mouth daily.    . potassium citrate (UROCIT-K) 10 MEQ (1080 MG) SR tablet Take 10 mEq by mouth 2 (two) times daily.     . tamsulosin (FLOMAX) 0.4 MG CAPS Take 0.4 mg by mouth at bedtime.    . traZODone (DESYREL) 50 MG tablet Take 1 tablet by mouth at bedtime.    . fluticasone (FLONASE) 50 MCG/ACT nasal spray PLACE TWO SPRAYS INTO BOTH NOSTRILS DAILY. 16 g 2  . pioglitazone (ACTOS) 30 MG tablet Take 1 tablet (30 mg total) by mouth daily. 30 tablet 1  . sitaGLIPtin-metformin (JANUMET) 50-1000 MG tablet Take 1 tablet by mouth 2 (two) times daily with a meal. 60 tablet 1   No facility-administered medications prior to visit.     ROS Review of Systems  Constitutional: Negative for diaphoresis and unexpected weight change.  HENT: Negative.   Eyes: Negative.  Respiratory: Negative.   Cardiovascular: Negative.   Gastrointestinal: Negative for abdominal pain, constipation and nausea.  Endocrine: Negative for polyphagia and polyuria.  Genitourinary: Positive for urgency. Negative for difficulty urinating.  Skin: Negative.   Allergic/Immunologic: Negative for immunocompromised state.  Neurological: Negative for weakness and headaches.  Hematological: Negative.   Psychiatric/Behavioral: Negative.     Objective:  BP 128/90   Pulse 76   Temp (!) 97.5 F (36.4 C)   Ht 6' (1.829 m)   Wt 273 lb 8 oz (124.1 kg)   SpO2 92%   BMI 37.09 kg/m   BP Readings from Last 3 Encounters:  11/16/17 128/90  11/02/17 134/80  07/06/17 137/81    Wt Readings from Last 3 Encounters:  11/16/17 273 lb 8 oz (124.1 kg)  11/02/17 269 lb 4 oz  (122.1 kg)  08/25/17 279 lb (126.6 kg)    Physical Exam  Constitutional: He is oriented to person, place, and time. He appears well-developed and well-nourished. No distress.  HENT:  Head: Normocephalic and atraumatic.  Right Ear: External ear normal.  Left Ear: External ear normal.  Eyes: Right eye exhibits no discharge. Left eye exhibits no discharge.  Neck: No JVD present. No tracheal deviation present.  Pulmonary/Chest: Effort normal.  Abdominal: He exhibits distension. He exhibits no mass. There is no tenderness. There is no rebound and no guarding. A hernia is present. Hernia confirmed positive in the ventral area.    Neurological: He is alert and oriented to person, place, and time.  Skin: Skin is warm and dry. He is not diaphoretic.  Psychiatric: He has a normal mood and affect. His behavior is normal.    Lab Results  Component Value Date   WBC 7.1 11/02/2017   HGB 14.0 11/02/2017   HCT 40.6 11/02/2017   PLT 319.0 11/02/2017   GLUCOSE 109 (H) 11/02/2017   CHOL 137 11/02/2017   TRIG 204.0 (H) 11/02/2017   HDL 38.30 (L) 11/02/2017   LDLDIRECT 66.0 11/02/2017   LDLCALC 69 07/09/2016   ALT 27 11/02/2017   AST 22 11/02/2017   NA 138 11/02/2017   K 4.0 11/02/2017   CL 104 11/02/2017   CREATININE 1.11 11/02/2017   BUN 22 11/02/2017   CO2 24 11/02/2017   TSH 0.97 07/09/2016   PSA 0.51 12/25/2008   INR 1.13 12/11/2014   HGBA1C 7.6 (H) 11/02/2017   MICROALBUR 30.8 (H) 11/02/2017    Mr Lumbar Spine Wo Contrast  Result Date: 05/10/2015 CLINICAL DATA:  Low back pain. BILATERAL feet numbness. Symptom duration for years. EXAM: MRI LUMBAR SPINE WITHOUT CONTRAST TECHNIQUE: Multiplanar, multisequence MR imaging of the lumbar spine was performed. No intravenous contrast was administered. COMPARISON:  10/25/2009. FINDINGS: Segmentation: Normal. Alignment:  Normal. Vertebrae: No worrisome osseous lesion. Conus medullaris: Normal in size, signal, and location. Paraspinal tissues:  No evidence for hydronephrosis or paravertebral mass. Disc levels: L1-L2:  Normal. L2-L3:  Normal. L3-L4: Central and leftward protrusion. Mild facet arthropathy. LEFT subarticular zone and foraminal zone narrowing could affect the L3 and L4 nerve roots. L4-L5: Central and rightward protrusion. Mild facet arthropathy. RIGHT subarticular zone and foraminal zone narrowing could affect the L4 and L5 nerve roots. L5-S1: Shallow central protrusion. Central annular rent projects between both S1 nerve roots. No impingement. Compared with priors, a similar appearance is noted. IMPRESSION: Central and leftward protrusion at L3-4 extends to the foramen. Similarly central and rightward protrusion at L4-5 extends to the foramen. Symptomatic neural impingement could occur at either or both  levels. Electronically Signed   By: Staci Righter M.D.   On: 05/10/2015 09:17    Assessment & Plan:   Yuvin was seen today for follow-up.  Diagnoses and all orders for this visit:  Uncontrolled type 2 diabetes mellitus with hyperglycemia ([place]) -     metFORMIN (GLUCOPHAGE) 1000 MG tablet; Take 1 tablet (1,000 mg total) by mouth 2 (two) times daily with a meal. -     Discontinue: Exenatide ER 2 MG PEN; Inject 17m weekly -     Dulaglutide (TRULICITY) 03.54MTG/2.5WLSOPN; Inject .5350mweekly into alternate sights on abdomen and thighs.  Umbilical hernia without obstruction and without gangrene   I have discontinued Demetrie W. Tafoya's fluticasone, pioglitazone, sitaGLIPtin-metformin, and Exenatide ER. I am also having him start on metFORMIN and Dulaglutide. Additionally, I am having him maintain his allopurinol, hydrochlorothiazide, multivitamin with minerals, potassium citrate, tamsulosin, aspirin, ONE TOUCH ULTRA SYSTEM KIT, onetouch ultrasoft, escitalopram, amphetamine-dextroamphetamine, traZODone, amLODipine-benazepril, atorvastatin, Insulin Pen Needle, glucose blood, FLUoxetine, and BASAGLAR KWIKPEN.  Meds ordered this  encounter  Medications  . metFORMIN (GLUCOPHAGE) 1000 MG tablet    Sig: Take 1 tablet (1,000 mg total) by mouth 2 (two) times daily with a meal.    Dispense:  180 tablet    Refill:  3  . DISCONTD: Exenatide ER 2 MG PEN    Sig: Inject 50m20meekly    Dispense:  4 each    Refill:  6  . Dulaglutide (TRULICITY) 0.78.93/TD/4.2AJPN    Sig: Inject .5ml65mekly into alternate sights on abdomen and thighs.    Dispense:  4 pen    Refill:  6   We will go ahead and discontinue the Januvia and metformin and the Actos.  Started him on by durian extended ejection.  He will also start taking metformin alone 1000 mg twice daily.  If this is affordable this will be a much better regimen for him.  I do not feel that his umbilical hernia need surgical attention at this time.  Encouraged him to continue abdominal strength training.  Follow-up in 1 month.   Follow-up: Return in about 1 month (around 12/17/2017).  WillLibby Maw

## 2017-11-17 ENCOUNTER — Encounter (INDEPENDENT_AMBULATORY_CARE_PROVIDER_SITE_OTHER): Payer: Self-pay | Admitting: Orthopedic Surgery

## 2017-11-17 ENCOUNTER — Ambulatory Visit (INDEPENDENT_AMBULATORY_CARE_PROVIDER_SITE_OTHER): Payer: BC Managed Care – PPO | Admitting: Orthopedic Surgery

## 2017-11-17 VITALS — Ht 72.0 in | Wt 273.0 lb

## 2017-11-17 DIAGNOSIS — M79672 Pain in left foot: Secondary | ICD-10-CM | POA: Diagnosis not present
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
## 2017-11-24 ENCOUNTER — Other Ambulatory Visit: Payer: Self-pay | Admitting: Internal Medicine

## 2017-11-28 ENCOUNTER — Encounter: Payer: Self-pay | Admitting: Pulmonary Disease

## 2017-11-28 ENCOUNTER — Ambulatory Visit: Payer: BC Managed Care – PPO | Admitting: Pulmonary Disease

## 2017-11-28 VITALS — BP 120/64 | HR 80 | Ht 72.0 in | Wt 274.4 lb

## 2017-11-28 DIAGNOSIS — G4733 Obstructive sleep apnea (adult) (pediatric): Secondary | ICD-10-CM | POA: Diagnosis not present

## 2017-11-28 NOTE — Assessment & Plan Note (Signed)
Increase CPAP pressure to 10 cm. He will call back to report about snoring and if necessary will increase further to 12 cm for control of snoring even though events are well controlled on current settings  Weight loss encouraged, compliance with goal of at least 4-6 hrs every night is the expectation. Advised against medications with sedative side effects Cautioned against driving when sleepy - understanding that sleepiness will vary on a day to day basis

## 2017-11-28 NOTE — Progress Notes (Signed)
   Subjective:    Patient ID: Leonard [name], male    DOB: Jun 06, 1956, 62 y.o.   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 He got a new machine in 2018 and this seems to be working well. He has been started on  New diabetic medications and has gained about 20 pounds since last year. He has noted snoring on his current CPAP settings of 8 cm. No problems with mask, wonders if pressure can be increased.  CPAP download was reviewed which shows excellent compliance on 8 cm with minimal leak and good control of events    Significant tests/ events reviewed  PSG on January 04, 2007 (wt 275) recorded 232 minutes of sleep with only 24.5 minutes of REM sleep. The sleep efficiency of 65%. Although the oral AHI was 8 per hour, but REM related AHI was 31.8. The lowest desaturation was 87%.   8/ 2011  autoCPAP >> 8 cm based on download    Review of Systems Patient denies significant dyspnea,cough, hemoptysis,  chest pain, palpitations, pedal edema, orthopnea, paroxysmal nocturnal dyspnea, lightheadedness, nausea, vomiting, abdominal or  leg pains      Objective:   Physical Exam  Gen. Pleasant, obese, in no distress ENT - no lesions, no post nasal drip Neck: No JVD, no thyromegaly, no carotid bruits Lungs: no use of accessory muscles, no dullness to percussion, decreased without rales or rhonchi  Cardiovascular: Rhythm regular, heart sounds  normal, no murmurs or gallops, no peripheral edema Musculoskeletal: No deformities, no cyanosis or clubbing , no tremors       Assessment & Plan:

## 2017-11-28 NOTE — Assessment & Plan Note (Signed)
Weight loss encouraged and he is hopeful that changing his diabetes medications will help him in that direction

## 2017-11-28 NOTE — Patient Instructions (Signed)
Increase CPAP pr to 10 cm Call me back to report

## 2017-12-05 ENCOUNTER — Ambulatory Visit: Payer: Self-pay | Admitting: *Deleted

## 2017-12-05 DIAGNOSIS — E119 Type 2 diabetes mellitus without complications: Secondary | ICD-10-CM

## 2017-12-05 NOTE — Telephone Encounter (Signed)
   Pt states he takes Insulin Glargine (BASAGLAR KWIKPEN) 100 UNIT/ML SOPN and he was instructed to increase the dosage by 5 units per day until he found the amount that controls his sugar level. Pt states the amount is between 50-60 units daily and when called the pharmacy for a refill he was told the Rx was for 90 days so he could not receive a refill until August 8,2019. Pt states the original Rx was for 15 units per day and now he only has about a 4 day supply. Pt would like a call back to discuss options. Cb# [phone]

## 2017-12-05 NOTE — Telephone Encounter (Signed)
  Answer Assessment - Initial Assessment Questions 1. SYMPTOMS: "Do you have any symptoms?"     No symptoms 2. SEVERITY: If symptoms are present, ask "Are they mild, moderate or severe?" N/a  Protocols used: MEDICATION QUESTION CALL-A-AH

## 2017-12-06 MED ORDER — BASAGLAR KWIKPEN 100 UNIT/ML ~~LOC~~ SOPN: 60.0000 [IU] | PEN_INJECTOR | Freq: Every day | SUBCUTANEOUS | 5 refills | Status: DC

## 2017-12-06 NOTE — Telephone Encounter (Signed)
Okay for new Rx that states to use 60 units daily?

## 2017-12-06 NOTE — Telephone Encounter (Signed)
Yes. Please send a RX that says patient may require at least 60U daily.

## 2017-12-06 NOTE — Telephone Encounter (Signed)
I called and spoke with patient. Patient is aware that a new Rx for 60 units daily has been sent into [facility]. Advised patient to let us know if there are any issues with getting it filled.

## 2017-12-07 ENCOUNTER — Telehealth (INDEPENDENT_AMBULATORY_CARE_PROVIDER_SITE_OTHER): Payer: Self-pay | Admitting: Orthopedic Surgery

## 2017-12-07 NOTE — Telephone Encounter (Signed)
Blue sheet completed.

## 2017-12-07 NOTE — Telephone Encounter (Signed)
Leonard [name] called and would like to schedule surgery for November.  Can you please review and fill out a surgery sheet and I will call to schedule. Thank you

## 2017-12-19 ENCOUNTER — Ambulatory Visit: Payer: BC Managed Care – PPO | Admitting: Family Medicine

## 2017-12-19 ENCOUNTER — Encounter: Payer: Self-pay | Admitting: Family Medicine

## 2017-12-19 VITALS — BP 132/80 | HR 76 | Ht 72.0 in | Wt 266.0 lb

## 2017-12-19 DIAGNOSIS — R7309 Other abnormal glucose: Secondary | ICD-10-CM | POA: Diagnosis not present

## 2017-12-19 NOTE — Progress Notes (Signed)
Subjective:  Patient ID: Leonard [name], male    DOB: March 06, 1956  Age: 62 y.o. MRN: [id number]  CC: Follow-up   HPI Leonard [name] presents for follow-up of his diabetes.  He says that his sugars have been ranging anywhere from 90 to 160 or so.  He is choosing to hold his Lantus when his evening sugars are 100.  He is basing his evening Lantus dose on what his evening blood sugars have been to be.  Reminded him that Lantus is a basal insulin and should not drop his blood sugars precipitously.  He tells me that when his sugars have been up to 160 and he is taking a bolus of Lantus they have responded promptly.  Reminded him that Lantus dosing is based on the morning fasting blood sugar level.  He is then given himself an additional a.m. Lantus dose when his fasting sugars are elevated.  He continues to take Trulicity once a week and Glucophage at thousand twice daily.  He has worked hard on lowering the carbs in his diet and is lost 8 pounds.  He is dealing with some increased anxiety since his Lexapro was discontinued 3 weeks ago.  He is taking the Prozac 20 mg every third day.  Outpatient Medications Prior to Visit  Medication Sig Dispense Refill  . allopurinol (ZYLOPRIM) 100 MG tablet Take 100 mg by mouth daily before breakfast.     . amLODipine-benazepril (LOTREL) 10-40 MG capsule TAKE ONE CAPSULE BY MOUTH AT BEDTIME. 30 capsule 1  . amphetamine-dextroamphetamine (ADDERALL) 10 MG tablet Take 10 mg by mouth 2 (two) times daily with a meal.     . aspirin 81 MG tablet Take 81 mg by mouth daily.    [name] atorvastatin (LIPITOR) 20 MG tablet TAKE ONE TABLET (20 MG TOTAL) BY MOUTH DAILY. 30 tablet 1  . Blood Glucose Monitoring Suppl (ONE TOUCH ULTRA SYSTEM KIT) w/Device KIT 1 kit by Does not apply route once. 1 each 0  . Dulaglutide (TRULICITY) 0.34 JZ/7.9XT SOPN Inject .1m weekly into alternate sights on abdomen and thighs. 4 pen 6  . FLUoxetine (PROZAC) 20 MG capsule Take 1 capsule by  mouth every 3 days.    .Marland Kitchenglucose blood (ONE TOUCH ULTRA TEST) test strip CHECK BLOOD SUGAR NO MORE THAN TWICE DAILY 100 each 1  . hydrochlorothiazide (HYDRODIURIL) 25 MG tablet Take 25 mg by mouth daily before breakfast.    . Insulin Glargine (BASAGLAR KWIKPEN) 100 UNIT/ML SOPN Inject 0.6 mLs (60 Units total) into the skin at bedtime. 15 mL 5  . Insulin Pen Needle (BD PEN NEEDLE NANO U/F) 32G X 4 MM MISC TO USE WITH VICTOZA 100 each 1  . Lancets (ONETOUCH ULTRASOFT) lancets Check blood sugar no more than twice daily 100 each 12  . metFORMIN (GLUCOPHAGE) 1000 MG tablet Take 1 tablet (1,000 mg total) by mouth 2 (two) times daily with a meal. 180 tablet 3  . Multiple Vitamin (MULTIVITAMIN WITH MINERALS) TABS Take 1 tablet by mouth daily.    . potassium citrate (UROCIT-K) 10 MEQ (1080 MG) SR tablet Take 10 mEq by mouth 2 (two) times daily.     . tamsulosin (FLOMAX) 0.4 MG CAPS Take 0.4 mg by mouth at bedtime.    . traZODone (DESYREL) 50 MG tablet Take 1 tablet by mouth at bedtime.    .Marland Kitchenescitalopram (LEXAPRO) 10 MG tablet Take 3 tablets (30 mg total) by mouth daily.    .Marland KitchenFLUoxetine (PROZAC) 10 MG capsule  Take 1 capsule by mouth daily.     No facility-administered medications prior to visit.     ROS Review of Systems  Constitutional: Negative for chills, fatigue, fever and unexpected weight change.  Endocrine: Negative for polyphagia and polyuria.  Neurological: Negative for weakness and headaches.  Hematological: Does not bruise/bleed easily.  Psychiatric/Behavioral: The patient is nervous/anxious.     Objective:  BP 132/80   Pulse 76   Ht 6' (1.829 m)   Wt 266 lb (120.7 kg)   SpO2 94%   BMI 36.08 kg/m   BP Readings from Last 3 Encounters:  12/19/17 132/80  11/28/17 120/64  11/16/17 128/90    Wt Readings from Last 3 Encounters:  12/19/17 266 lb (120.7 kg)  11/28/17 274 lb 6.4 oz (124.5 kg)  11/17/17 273 lb (123.8 kg)    Physical Exam  Constitutional: He is oriented to  person, place, and time. He appears well-developed and well-nourished. No distress.  HENT:  Head: Normocephalic and atraumatic.  Right Ear: External ear normal.  Left Ear: External ear normal.  Eyes: Right eye exhibits no discharge. Left eye exhibits no discharge.  Neck: No JVD present. No tracheal deviation present.  Pulmonary/Chest: Breath sounds normal.  Neurological: He is alert and oriented to person, place, and time.  Skin: Skin is warm and dry. He is not diaphoretic.  Psychiatric: He has a normal mood and affect. His behavior is normal.    Lab Results  Component Value Date   WBC 7.1 11/02/2017   HGB 14.0 11/02/2017   HCT 40.6 11/02/2017   PLT 319.0 11/02/2017   GLUCOSE 109 (H) 11/02/2017   CHOL 137 11/02/2017   TRIG 204.0 (H) 11/02/2017   HDL 38.30 (L) 11/02/2017   LDLDIRECT 66.0 11/02/2017   LDLCALC 69 07/09/2016   ALT 27 11/02/2017   AST 22 11/02/2017   NA 138 11/02/2017   K 4.0 11/02/2017   CL 104 11/02/2017   CREATININE 1.11 11/02/2017   BUN 22 11/02/2017   CO2 24 11/02/2017   TSH 0.97 07/09/2016   PSA 0.51 12/25/2008   INR 1.13 12/11/2014   HGBA1C 7.6 (H) 11/02/2017   MICROALBUR 30.8 (H) 11/02/2017    Mr Lumbar Spine Wo Contrast  Result Date: 05/10/2015 CLINICAL DATA:  Low back pain. BILATERAL feet numbness. Symptom duration for years. EXAM: MRI LUMBAR SPINE WITHOUT CONTRAST TECHNIQUE: Multiplanar, multisequence MR imaging of the lumbar spine was performed. No intravenous contrast was administered. COMPARISON:  10/25/2009. FINDINGS: Segmentation: Normal. Alignment:  Normal. Vertebrae: No worrisome osseous lesion. Conus medullaris: Normal in size, signal, and location. Paraspinal tissues: No evidence for hydronephrosis or paravertebral mass. Disc levels: L1-L2:  Normal. L2-L3:  Normal. L3-L4: Central and leftward protrusion. Mild facet arthropathy. LEFT subarticular zone and foraminal zone narrowing could affect the L3 and L4 nerve roots. L4-L5: Central and  rightward protrusion. Mild facet arthropathy. RIGHT subarticular zone and foraminal zone narrowing could affect the L4 and L5 nerve roots. L5-S1: Shallow central protrusion. Central annular rent projects between both S1 nerve roots. No impingement. Compared with priors, a similar appearance is noted. IMPRESSION: Central and leftward protrusion at L3-4 extends to the foramen. Similarly central and rightward protrusion at L4-5 extends to the foramen. Symptomatic neural impingement could occur at either or both levels. Electronically Signed   By: Staci Righter M.D.   On: 05/10/2015 09:17    Assessment & Plan:   Macon was seen today for follow-up.  Diagnoses and all orders for this visit:  Labile  blood glucose   I have discontinued Marny Lowenstein. Keetch's escitalopram. I am also having him maintain his allopurinol, hydrochlorothiazide, multivitamin with minerals, potassium citrate, tamsulosin, aspirin, ONE TOUCH ULTRA SYSTEM KIT, onetouch ultrasoft, amphetamine-dextroamphetamine, traZODone, Insulin Pen Needle, glucose blood, metFORMIN, Dulaglutide, amLODipine-benazepril, atorvastatin, BASAGLAR KWIKPEN, and FLUoxetine.  No orders of the defined types were placed in this encounter.  At this point it sounds like the patient might be overmedicated.  Believe that he needs another regimen for his diabetes with closer management.  Believe that he may need to see a nutritionist.  Have asked for endocrinology follow-up.  He may need twice daily Lantus Lantus dosing.  His Lexapro was discontinued just 3 weeks ago.  Asked him to hang in there and allow the Prozac to take effect.  He will follow-up with psychiatry.  Follow-up: Return endocrine follow up for now. Libby Maw, MD

## 2018-01-03 ENCOUNTER — Encounter: Payer: Self-pay | Admitting: Family Medicine

## 2018-01-11 ENCOUNTER — Telehealth: Payer: Self-pay

## 2018-01-11 DIAGNOSIS — E1165 Type 2 diabetes mellitus with hyperglycemia: Secondary | ICD-10-CM

## 2018-01-11 NOTE — Telephone Encounter (Signed)
Referral entered.   Copied from [place]. Topic: Referral - Request >> Jan 11, 2018  3:37 PM Vernona Rieger wrote: Reason for CRM:  Patient said that Dr Ethelene Hal wants him to see an endocrinologist and he is ready to move forward with that. Please advise. He said he would like to see Dr Dwyane Dee with [facility].

## 2018-01-19 ENCOUNTER — Other Ambulatory Visit: Payer: Self-pay | Admitting: Internal Medicine

## 2018-01-23 ENCOUNTER — Other Ambulatory Visit: Payer: Self-pay

## 2018-01-23 MED ORDER — AMLODIPINE BESY-BENAZEPRIL HCL 10-40 MG PO CAPS: 1.0000 | ORAL_CAPSULE | Freq: Every day | ORAL | 1 refills | Status: DC

## 2018-01-23 MED ORDER — ATORVASTATIN CALCIUM 20 MG PO TABS: ORAL_TABLET | ORAL | 1 refills | Status: DC

## 2018-01-25 ENCOUNTER — Other Ambulatory Visit: Payer: Self-pay | Admitting: Surgery

## 2018-02-01 ENCOUNTER — Other Ambulatory Visit: Payer: Self-pay | Admitting: Internal Medicine

## 2018-02-20 ENCOUNTER — Other Ambulatory Visit: Payer: Self-pay | Admitting: Family Medicine

## 2018-02-20 DIAGNOSIS — E1165 Type 2 diabetes mellitus with hyperglycemia: Secondary | ICD-10-CM

## 2018-02-20 NOTE — Telephone Encounter (Signed)
New referral entered per patient request.

## 2018-02-20 NOTE — Telephone Encounter (Signed)
Copied from [place] [phone]. Topic: Quick Communication - See Telephone Encounter >> Feb 20, 2018 10:48 AM Burchel, Abbi R wrote: CRM for notification. See Telephone encounter for: 02/20/18.  Pt requesting a referral to Dr Delrae Rend [facility]) Endocrinologist instead, to manage his diabetes.  Pt states he was never contacted to schedule previous appt/referral.   Pt: [phone]

## 2018-02-21 ENCOUNTER — Encounter (INDEPENDENT_AMBULATORY_CARE_PROVIDER_SITE_OTHER): Payer: Self-pay | Admitting: Orthopedic Surgery

## 2018-02-21 ENCOUNTER — Ambulatory Visit (INDEPENDENT_AMBULATORY_CARE_PROVIDER_SITE_OTHER): Payer: BC Managed Care – PPO | Admitting: Orthopedic Surgery

## 2018-02-21 ENCOUNTER — Ambulatory Visit (INDEPENDENT_AMBULATORY_CARE_PROVIDER_SITE_OTHER): Payer: Self-pay

## 2018-02-21 VITALS — Ht 72.0 in | Wt 266.0 lb

## 2018-02-21 DIAGNOSIS — R0781 Pleurodynia: Secondary | ICD-10-CM

## 2018-02-21 DIAGNOSIS — S2232XA Fracture of one rib, left side, initial encounter for closed fracture: Secondary | ICD-10-CM

## 2018-02-21 MED ORDER — METHOCARBAMOL 500 MG PO TABS: 500.0000 mg | ORAL_TABLET | Freq: Three times a day (TID) | ORAL | 0 refills | Status: DC
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
## 2018-02-24 ENCOUNTER — Telehealth: Payer: Self-pay

## 2018-02-24 ENCOUNTER — Encounter: Payer: Self-pay | Admitting: Family Medicine

## 2018-02-24 NOTE — Telephone Encounter (Signed)
Patient has been waiting on a call from [facility] since 12/2017. Can we see what's going on?

## 2018-02-25 DIAGNOSIS — S2239XA Fracture of one rib, unspecified side, initial encounter for closed fracture: Secondary | ICD-10-CM

## 2018-02-25 HISTORY — DX: Fracture of one rib, unspecified side, initial encounter for closed fracture: S22.39XA

## 2018-03-02 ENCOUNTER — Other Ambulatory Visit: Payer: Self-pay

## 2018-03-02 MED ORDER — INSULIN PEN NEEDLE 32G X 4 MM MISC: 2 refills | Status: DC

## 2018-03-06 NOTE — Pre-Procedure Instructions (Signed)
Leonard [name]  03/06/2018      [facility], [place] [address] [place] [address] Phone: [phone] Fax: [phone]    Your procedure is scheduled on September 18  Report to [facility].M.  Call this number if you have problems the morning of surgery:  [phone]   Remember:  Do not eat or drink after midnight.      Take these medicines the morning of surgery with A SIP OF WATER  allopurinol (ZYLOPRIM) ALPRAZolam (XANAX) sertraline (ZOLOFT)   Follow your surgeon's instructions on when to stop Asprin.  If no instructions were given by your surgeon then you will need to call the office to get those instructions.     WHAT DO I DO ABOUT MY DIABETES MEDICATION?   [name] Do not take oral diabetes medicines (pills) the morning of surgery. metFORMIN (GLUCOPHAGE)   . THE NIGHT BEFORE SURGERY, take _____30______ units of __Insulin Glargine (BASAGLAR KWIKPEN_________insulin.       . The day of surgery, do not take other diabetes injectables, including Byetta (exenatide), Bydureon (exenatide ER), Victoza (liraglutide), or Trulicity (dulaglutide).  . If your CBG is greater than 220 mg/dL, you may take  of your sliding scale (correction) dose of insulin.   How to Manage Your Diabetes Before and After Surgery  Why is it important to control my blood sugar before and after surgery? . Improving blood sugar levels before and after surgery helps healing and can limit problems. . A way of improving blood sugar control is eating a healthy diet by: o  Eating less sugar and carbohydrates o  Increasing activity/exercise o  Talking with your doctor about reaching your blood sugar goals . High blood sugars (greater than 180 mg/dL) can raise your risk of infections and slow your recovery, so you will need to focus on controlling your diabetes during the weeks before surgery. . Make sure  that the doctor who takes care of your diabetes knows about your planned surgery including the date and location.  How do I manage my blood sugar before surgery? . Check your blood sugar at least 4 times a day, starting 2 days before surgery, to make sure that the level is not too high or low. o Check your blood sugar the morning of your surgery when you wake up and every 2 hours until you get to the Short Stay unit. . If your blood sugar is less than 70 mg/dL, you will need to treat for low blood sugar: o Do not take insulin. o Treat a low blood sugar (less than 70 mg/dL) with  cup of clear juice (cranberry or apple), 4 glucose tablets, OR glucose gel. o Recheck blood sugar in 15 minutes after treatment (to make sure it is greater than 70 mg/dL). If your blood sugar is not greater than 70 mg/dL on recheck, call [phone] for further instructions. . Report your blood sugar to the short stay nurse when you get to Short Stay.  . If you are admitted to the hospital after surgery: o Your blood sugar will be checked by the staff and you will probably be given insulin after surgery (instead of oral diabetes medicines) to make sure you have good blood sugar levels. o The goal for blood sugar control after surgery is 80-180 mg/dL     Do not wear jewelry  Do not wear lotions, powders,  or cologne, or deodorant.   Men may shave face and neck.  Do not bring valuables to the hospital.  [facility] is not responsible for any belongings or valuables.  Contacts, dentures or bridgework may not be worn into surgery.  Leave your suitcase in the car.  After surgery it may be brought to your room.  For patients admitted to the hospital, discharge time will be determined by your treatment team.  Patients discharged the day of surgery will not be allowed to drive home.    Special instructions:   [facility]- Preparing For Surgery  Before surgery, you can play an important role. Because skin is not  sterile, your skin needs to be as free of germs as possible. You can reduce the number of germs on your skin by washing with CHG (chlorahexidine gluconate) Soap before surgery.  CHG is an antiseptic cleaner which kills germs and bonds with the skin to continue killing germs even after washing.    Oral Hygiene is also important to reduce your risk of infection.  Remember - BRUSH YOUR TEETH THE MORNING OF SURGERY WITH YOUR REGULAR TOOTHPASTE  Please do not use if you have an allergy to CHG or antibacterial soaps. If your skin becomes reddened/irritated stop using the CHG.  Do not shave (including legs and underarms) for at least 48 hours prior to first CHG shower. It is OK to shave your face.  Please follow these instructions carefully.   1. Shower the NIGHT BEFORE SURGERY and the MORNING OF SURGERY with CHG.   2. If you chose to wash your hair, wash your hair first as usual with your normal shampoo.  3. After you shampoo, rinse your hair and body thoroughly to remove the shampoo.  4. Use CHG as you would any other liquid soap. You can apply CHG directly to the skin and wash gently with a scrungie or a clean washcloth.   5. Apply the CHG Soap to your body ONLY FROM THE NECK DOWN.  Do not use on open wounds or open sores. Avoid contact with your eyes, ears, mouth and genitals (private parts). Wash Face and genitals (private parts)  with your normal soap.  6. Wash thoroughly, paying special attention to the area where your surgery will be performed.  7. Thoroughly rinse your body with warm water from the neck down.  8. DO NOT shower/wash with your normal soap after using and rinsing off the CHG Soap.  9. Pat yourself dry with a CLEAN TOWEL.  10. Wear CLEAN PAJAMAS to bed the night before surgery, wear comfortable clothes the morning of surgery  11. Place CLEAN SHEETS on your bed the night of your first shower and DO NOT SLEEP WITH PETS.    Day of Surgery:  Do not apply any  deodorants/lotions.  Please wear clean clothes to the hospital/surgery center.   Remember to brush your teeth WITH YOUR REGULAR TOOTHPASTE.    Please read over the following fact sheets that you were given.

## 2018-03-07 ENCOUNTER — Encounter (HOSPITAL_COMMUNITY): Payer: Self-pay

## 2018-03-07 ENCOUNTER — Other Ambulatory Visit: Payer: Self-pay

## 2018-03-07 ENCOUNTER — Encounter (HOSPITAL_COMMUNITY)
Admission: RE | Admit: 2018-03-07 | Discharge: 2018-03-07 | Disposition: A | Discharge status: Home / Self Care | Payer: BC Managed Care – PPO | Source: Ambulatory Visit | Attending: Surgery | Admitting: Surgery

## 2018-03-07 DIAGNOSIS — K429 Umbilical hernia without obstruction or gangrene: Secondary | ICD-10-CM | POA: Insufficient documentation

## 2018-03-07 DIAGNOSIS — G4733 Obstructive sleep apnea (adult) (pediatric): Secondary | ICD-10-CM | POA: Diagnosis not present

## 2018-03-07 DIAGNOSIS — E785 Hyperlipidemia, unspecified: Secondary | ICD-10-CM | POA: Diagnosis not present

## 2018-03-07 DIAGNOSIS — I10 Essential (primary) hypertension: Secondary | ICD-10-CM | POA: Diagnosis not present

## 2018-03-07 DIAGNOSIS — E119 Type 2 diabetes mellitus without complications: Secondary | ICD-10-CM | POA: Insufficient documentation

## 2018-03-07 DIAGNOSIS — Z87891 Personal history of nicotine dependence: Secondary | ICD-10-CM | POA: Insufficient documentation

## 2018-03-07 DIAGNOSIS — Z794 Long term (current) use of insulin: Secondary | ICD-10-CM | POA: Diagnosis not present

## 2018-03-07 DIAGNOSIS — F988 Other specified behavioral and emotional disorders with onset usually occurring in childhood and adolescence: Secondary | ICD-10-CM | POA: Insufficient documentation

## 2018-03-07 DIAGNOSIS — Z79899 Other long term (current) drug therapy: Secondary | ICD-10-CM | POA: Insufficient documentation

## 2018-03-07 DIAGNOSIS — Z01818 Encounter for other preprocedural examination: Secondary | ICD-10-CM | POA: Insufficient documentation

## 2018-03-07 HISTORY — DX: Obsessive-compulsive disorder, unspecified: F42.9

## 2018-03-07 HISTORY — DX: Personal history of urinary calculi: Z87.442

## 2018-03-07 LAB — BASIC METABOLIC PANEL
ANION GAP: 11 (ref 5–15)
BUN: 16 mg/dL (ref 8–23)
CALCIUM: 9 mg/dL (ref 8.9–10.3)
CO2: 21 mmol/L — AB (ref 22–32)
Chloride: 104 mmol/L (ref 98–111)
Creatinine, Ser: 1.01 mg/dL (ref 0.61–1.24)
GFR calc Af Amer: >60 mL/min (ref >60)
GFR calc non Af Amer: >60 mL/min (ref >60)
GLUCOSE: 206 mg/dL — AB (ref 70–99)
Potassium: 4 mmol/L (ref 3.5–5.1)
Sodium: 136 mmol/L (ref 135–145)

## 2018-03-07 LAB — HEMOGLOBIN A1C
HEMOGLOBIN A1C: 8.7 % — AB (ref 4.8–5.6)
Mean Plasma Glucose: 202.99 mg/dL

## 2018-03-07 LAB — CBC
HEMATOCRIT: 40.5 % (ref 39.0–52.0)
HEMOGLOBIN: 13.8 g/dL (ref 13.0–17.0)
MCH: 29.7 pg (ref 26.0–34.0)
MCHC: 34.1 g/dL (ref 30.0–36.0)
MCV: 87.3 fL (ref 78.0–100.0)
Platelets: 316 10*3/uL (ref 150–400)
RBC: 4.64 MIL/uL (ref 4.22–5.81)
RDW: 12.8 % (ref 11.5–15.5)
WBC: 10.2 10*3/uL (ref 4.0–10.5)

## 2018-03-07 LAB — GLUCOSE, CAPILLARY: Glucose-Capillary: 238 mg/dL — ABNORMAL HIGH (ref 70–99)

## 2018-03-07 NOTE — Pre-Procedure Instructions (Signed)
Prashant Glosser [facility]  03/07/2018      [place], [place] [address] [place] [address] Phone: [phone] Fax: [phone]    Your procedure is scheduled on September 18  Report to [facility] at [facility].M.  Call this number if you have problems the morning of surgery:  [phone]   Remember:  Do not eat or drink after midnight.      Take these medicines the morning of surgery with A SIP OF WATER  allopurinol (ZYLOPRIM) ALPRAZolam (XANAX) sertraline (ZOLOFT)   Follow your surgeon's instructions on when to stop Asprin.  If no instructions were given by your surgeon then you will need to call the office to get those instructions.     WHAT DO I DO ABOUT MY DIABETES MEDICATION?   [name] Do not take oral diabetes medicines (pills) the morning of surgery. metFORMIN (GLUCOPHAGE)   . THE NIGHT BEFORE SURGERY, take _____30______ units of __Insulin Glargine (BASAGLAR KWIKPEN_________insulin.       . The day of surgery, do not take other diabetes injectables, including Byetta (exenatide), Bydureon (exenatide ER), Victoza (liraglutide), or Trulicity (dulaglutide).  . If your CBG is greater than 220 mg/dL, you may take  of your sliding scale (correction) dose of insulin.   How to Manage Your Diabetes Before and After Surgery  Why is it important to control my blood sugar before and after surgery? . Improving blood sugar levels before and after surgery helps healing and can limit problems. . A way of improving blood sugar control is eating a healthy diet by: o  Eating less sugar and carbohydrates o  Increasing activity/exercise o  Talking with your doctor about reaching your blood sugar goals . High blood sugars (greater than 180 mg/dL) can raise your risk of infections and slow your recovery, so you will need to focus on controlling your diabetes during the weeks before surgery. . Make  sure that the doctor who takes care of your diabetes knows about your planned surgery including the date and location.  How do I manage my blood sugar before surgery? . Check your blood sugar at least 4 times a day, starting 2 days before surgery, to make sure that the level is not too high or low. o Check your blood sugar the morning of your surgery when you wake up and every 2 hours until you get to the Short Stay unit. . If your blood sugar is less than 70 mg/dL, you will need to treat for low blood sugar: o Do not take insulin. o Treat a low blood sugar (less than 70 mg/dL) with  cup of clear juice (cranberry or apple), 4 glucose tablets, OR glucose gel. o Recheck blood sugar in 15 minutes after treatment (to make sure it is greater than 70 mg/dL). If your blood sugar is not greater than 70 mg/dL on recheck, call [phone] for further instructions. . Report your blood sugar to the short stay nurse when you get to Short Stay.  . If you are admitted to the hospital after surgery: o Your blood sugar will be checked by the staff and you will probably be given insulin after surgery (instead of oral diabetes medicines) to make sure you have good blood sugar levels. o The goal for blood sugar control after surgery is 80-180 mg/dL     Do not wear jewelry  Do not wear lotions, powders,  or cologne, or deodorant.   Men may shave face and neck.  Do not bring valuables to the hospital.  [facility] is not responsible for any belongings or valuables.  Contacts, dentures or bridgework may not be worn into surgery.  Leave your suitcase in the car.  After surgery it may be brought to your room.  For patients admitted to the hospital, discharge time will be determined by your treatment team.  Patients discharged the day of surgery will not be allowed to drive home.    Special instructions:   [facility]- Preparing For Surgery  Before surgery, you can play an important role. Because skin is not  sterile, your skin needs to be as free of germs as possible. You can reduce the number of germs on your skin by washing with CHG (chlorahexidine gluconate) Soap before surgery.  CHG is an antiseptic cleaner which kills germs and bonds with the skin to continue killing germs even after washing.    Oral Hygiene is also important to reduce your risk of infection.  Remember - BRUSH YOUR TEETH THE MORNING OF SURGERY WITH YOUR REGULAR TOOTHPASTE  Please do not use if you have an allergy to CHG or antibacterial soaps. If your skin becomes reddened/irritated stop using the CHG.  Do not shave (including legs and underarms) for at least 48 hours prior to first CHG shower. It is OK to shave your face.  Please follow these instructions carefully.   1. Shower the NIGHT BEFORE SURGERY and the MORNING OF SURGERY with CHG.   2. If you chose to wash your hair, wash your hair first as usual with your normal shampoo.  3. After you shampoo, rinse your hair and body thoroughly to remove the shampoo.  4. Use CHG as you would any other liquid soap. You can apply CHG directly to the skin and wash gently with a scrungie or a clean washcloth.   5. Apply the CHG Soap to your body ONLY FROM THE NECK DOWN.  Do not use on open wounds or open sores. Avoid contact with your eyes, ears, mouth and genitals (private parts). Wash Face and genitals (private parts)  with your normal soap.  6. Wash thoroughly, paying special attention to the area where your surgery will be performed.  7. Thoroughly rinse your body with warm water from the neck down.  8. DO NOT shower/wash with your normal soap after using and rinsing off the CHG Soap.  9. Pat yourself dry with a CLEAN TOWEL.  10. Wear CLEAN PAJAMAS to bed the night before surgery, wear comfortable clothes the morning of surgery  11. Place CLEAN SHEETS on your bed the night of your first shower and DO NOT SLEEP WITH PETS.    Day of Surgery:  Do not apply any  deodorants/lotions.  Please wear clean clothes to the hospital/surgery center.   Remember to brush your teeth WITH YOUR REGULAR TOOTHPASTE.    Please read over the following fact sheets that you were given.

## 2018-03-07 NOTE — Progress Notes (Signed)
  PCP  Libby Maw  Pt. Saw Dr. Leighton Roach in 2011 and had a stress test which pt. States was normal. Results in Epic  Pt. Was told he did not need to follow up with him he PCP could monitor everything.
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
## 2018-03-14 MED ORDER — DEXTROSE 5 % IV SOLN
3.0000 g | INTRAVENOUS | Status: DC
  Filled 2018-03-14: qty 3000

## 2018-03-14 NOTE — Anesthesia Preprocedure Evaluation (Addendum)
Anesthesia Evaluation  Patient identified by MRN, date of birth, ID band Patient awake    Reviewed: Allergy & Precautions, NPO status , Patient's Chart, lab work & pertinent test results  Airway Mallampati: II  TM Distance: >3 FB Neck ROM: Full    Dental no notable dental hx. (+) Teeth Intact, Dental Advidsory Given   Pulmonary sleep apnea , former smoker,    Pulmonary exam normal breath sounds clear to auscultation       Cardiovascular hypertension, Pt. on medications + Peripheral Vascular Disease  Normal cardiovascular exam Rhythm:Regular Rate:Normal   EKG: 03/07/18: NSR.   CV: Nuclear stress test 03/31/2010: -Exercise Capacity: Excellent exercise capacity. -BP Response: Hypertensive blood pressure response. -Clinical Symptoms: No chest pain -ECG Impression: Significant ST abnormalities consistent with ischemia. -Overall Impression: Normal stress nuclear study. -Overall Impression Comments: Mild apical thinning. No ischemia.     Neuro/Psych PSYCHIATRIC DISORDERS negative neurological ROS  negative psych ROS   GI/Hepatic negative GI ROS, Neg liver ROS,   Endo/Other  negative endocrine ROSdiabetes  Renal/GU Renal disease     Musculoskeletal  (+) Arthritis ,   Abdominal   Peds  Hematology negative hematology ROS (+)   Anesthesia Other Findings   Reproductive/Obstetrics negative OB ROS                            Anesthesia Physical  Anesthesia Plan  ASA: III  Anesthesia Plan: General   Post-op Pain Management:    Induction: Intravenous  PONV Risk Score and Plan: 2 and Ondansetron and Treatment may vary due to age or medical condition  Airway Management Planned: Oral ETT  Additional Equipment:   Intra-op Plan:   Post-operative Plan: Extubation in OR  Informed Consent: I have reviewed the patients History and Physical, chart, labs and discussed the procedure  including the risks, benefits and alternatives for the proposed anesthesia with the patient or authorized representative who has indicated his/her understanding and acceptance.   Dental advisory given and Dental Advisory Given  Plan Discussed with: CRNA, Surgeon and Anesthesiologist  Anesthesia Plan Comments:        Anesthesia Quick Evaluation

## 2018-03-14 NOTE — H&P (Signed)
Leonard Leonard [name]  Location: [facility] Patient #: [id number] DOB: 01-04-1956 Married / Language: English / Race: Native Hawaiian or Other Leonard Leonard [name]   History of Present Illness  The patient is a 62 year old Leonard [name] who presents with an umbilical hernia. This patient is a self-referral. I recently operated on a family member of his. He has a known about a hernia. He's been having increasing discomfort from the hernia focally but no obstructive symptoms. He is otherwise without complaints.   Past Surgical History  Colon Polyp Removal - Colonoscopy  Foot Surgery  Left. Knee Surgery  Bilateral. Oral Surgery   Diagnostic Studies History  Colonoscopy  within last year  Allergies Naproxen *ANALGESICS - ANTI-INFLAMMATORY*  Allergies Reconciled   Medication History  Amphetamine-Dextroamphetamine (10MG  Tablet, Oral) Active. Amlodipine Besy-Benazepril HCl (10-40MG  Capsule, Oral) Active. Allopurinol (300MG  Tablet, Oral) Active. Atorvastatin Calcium (20MG  Tablet, Oral) Active. Basaglar KwikPen (100UNIT/ML Soln Pen-inj, Subcutaneous) Active. BD Pen Needle Nano U/F (32G X 4 MM Misc,) Active. FLUoxetine HCl (10MG  Capsule, Oral) Active. FLUoxetine HCl (20MG  Capsule, Oral) Active. GlipiZIDE (10MG  Tablet, Oral) Active. Escitalopram Oxalate (20MG  Tablet, Oral) Active. HydroCHLOROthiazide (25MG  Tablet, Oral) Active. Janumet (50-1000MG  Tablet, Oral) Active. MetFORMIN HCl (1000MG  Tablet, Oral) Active. Pioglitazone HCl (30MG  Tablet, Oral) Active. Potassium Citrate ER (10 MEQ(1080 MG) Tablet ER, Oral) Active. OneTouch Ultra Blue (In Vitro) Active. Tamsulosin HCl (0.4MG  Capsule, Oral) Active. TraZODone HCl (50MG  Tablet, Oral) Active. Trulicity (0.75MG /0.5ML Soln Pen-inj, Subcutaneous) Active. Victoza (18MG /3ML Soln Pen-inj, Subcutaneous) Active. Medications Reconciled  Social History  Caffeine use  Coffee. No alcohol use  No drug  use  Tobacco use  Former smoker.  Family History  Depression  Daughter, Mother. Diabetes Mellitus  Family Members In General. Hypertension  Family Members In General. Prostate Cancer  Father.  Other Problems  Anxiety Disorder  Back Pain  Depression  Diabetes Mellitus  High blood pressure  Hypercholesterolemia  Kidney Stone  Other disease, cancer, significant illness  Sleep Apnea  Umbilical Hernia Repair     Review of Systems  General Not Present- Appetite Loss, Chills, Fatigue, Fever, Night Sweats, Weight Gain and Weight Loss. Skin Not Present- Change in Wart/Mole, Dryness, Hives, Jaundice, New Lesions, Non-Healing Wounds, Rash and Ulcer. HEENT Present- Nose Bleed. Not Present- Earache, Hearing Loss, Hoarseness, Oral Ulcers, Ringing in the Ears, Seasonal Allergies, Sinus Pain, Sore Throat, Visual Disturbances, Wears glasses/contact lenses and Yellow Eyes. Respiratory Present- Snoring. Not Present- Bloody sputum, Chronic Cough, Difficulty Breathing and Wheezing. Cardiovascular Not Present- Chest Pain, Difficulty Breathing Lying Down, Leg Cramps, Palpitations, Rapid Heart Rate, Shortness of Breath and Swelling of Extremities. Gastrointestinal Not Present- Abdominal Pain, Bloating, Bloody Stool, Change in Bowel Habits, Chronic diarrhea, Constipation, Difficulty Swallowing, Excessive gas, Gets full quickly at meals, Hemorrhoids, Indigestion, Nausea, Rectal Pain and Vomiting. Leonard [name] Genitourinary Present- Urgency. Not Present- Blood in Urine, Change in Urinary Stream, Frequency, Impotence, Nocturia, Painful Urination and Urine Leakage. Musculoskeletal Present- Back Pain. Not Present- Joint Pain, Joint Stiffness, Muscle Pain, Muscle Weakness and Swelling of Extremities. Neurological Not Present- Decreased Memory, Fainting, Headaches, Numbness, Seizures, Tingling, Tremor, Trouble walking and Weakness. Psychiatric Present- Anxiety and Depression. Not Present- Bipolar, Change in  Sleep Pattern, Fearful and Frequent crying. Endocrine Not Present- Cold Intolerance, Excessive Hunger, Hair Changes, Heat Intolerance, Hot flashes and New Diabetes. Hematology Not Present- Blood Thinners, Easy Bruising, Excessive bleeding, Gland problems, HIV and Persistent Infections.  Vitals  Weight: 268.38 lb Height: 72in Body Surface Area: 2.41 m Body Mass Index: 36.4 kg/m  Temp.: 98.33F(Oral)  Pulse: 86 (Regular)  BP: 132/80 (Sitting, Left Arm, Standard)   Physical Exam  The physical exam findings are as follows: Note:On exam, he is well in appearance. Lungs are clear bilaterally Cardiovascular is regular rate and rhythm Abdomen is soft. He has an easily reducible, moderate sized umbilical hernia at the upper edge of the umbilicus. Skin is normal    Assessment & Plan   UMBILICAL HERNIA (D55.2)  Impression: We discussed the diagnosis in detail. He is here to proceed with hernia repair as it is causing him more discomfort. I discussed the surgical procedure in detail. I discussed use of mesh. We discussed the risks which includes but is not limited to bleeding, infection, injury to his returning structures, hernia recurrence, cardiopulmonary issues, postoperative recovery, etc. He understands and wishes to proceed

## 2018-03-15 ENCOUNTER — Ambulatory Visit (HOSPITAL_COMMUNITY)
Admission: RE | Admit: 2018-03-15 | Discharge: 2018-03-15 | Disposition: A | Discharge status: Home / Self Care | Payer: BC Managed Care – PPO | Source: Ambulatory Visit | Attending: Surgery | Admitting: Surgery

## 2018-03-15 ENCOUNTER — Ambulatory Visit (HOSPITAL_COMMUNITY): Payer: BC Managed Care – PPO | Admitting: Anesthesiology

## 2018-03-15 ENCOUNTER — Other Ambulatory Visit: Payer: Self-pay

## 2018-03-15 ENCOUNTER — Encounter (HOSPITAL_COMMUNITY): Payer: Self-pay | Admitting: Orthopedic Surgery

## 2018-03-15 ENCOUNTER — Encounter (HOSPITAL_COMMUNITY)
Admission: RE | Disposition: A | Discharge status: Home / Self Care | Payer: Self-pay | Source: Ambulatory Visit | Attending: Surgery

## 2018-03-15 ENCOUNTER — Ambulatory Visit (HOSPITAL_COMMUNITY): Payer: BC Managed Care – PPO | Admitting: Vascular Surgery

## 2018-03-15 DIAGNOSIS — Z7984 Long term (current) use of oral hypoglycemic drugs: Secondary | ICD-10-CM | POA: Insufficient documentation

## 2018-03-15 DIAGNOSIS — Z886 Allergy status to analgesic agent status: Secondary | ICD-10-CM | POA: Insufficient documentation

## 2018-03-15 DIAGNOSIS — I10 Essential (primary) hypertension: Secondary | ICD-10-CM | POA: Diagnosis not present

## 2018-03-15 DIAGNOSIS — G4730 Sleep apnea, unspecified: Secondary | ICD-10-CM | POA: Diagnosis not present

## 2018-03-15 DIAGNOSIS — K429 Umbilical hernia without obstruction or gangrene: Secondary | ICD-10-CM | POA: Insufficient documentation

## 2018-03-15 DIAGNOSIS — E119 Type 2 diabetes mellitus without complications: Secondary | ICD-10-CM | POA: Diagnosis not present

## 2018-03-15 DIAGNOSIS — E7800 Pure hypercholesterolemia, unspecified: Secondary | ICD-10-CM | POA: Diagnosis not present

## 2018-03-15 DIAGNOSIS — F329 Major depressive disorder, single episode, unspecified: Secondary | ICD-10-CM | POA: Insufficient documentation

## 2018-03-15 DIAGNOSIS — Z87891 Personal history of nicotine dependence: Secondary | ICD-10-CM | POA: Diagnosis not present

## 2018-03-15 DIAGNOSIS — Z79899 Other long term (current) drug therapy: Secondary | ICD-10-CM | POA: Insufficient documentation

## 2018-03-15 DIAGNOSIS — F419 Anxiety disorder, unspecified: Secondary | ICD-10-CM | POA: Diagnosis not present

## 2018-03-15 HISTORY — PX: UMBILICAL HERNIA REPAIR: SHX196

## 2018-03-15 HISTORY — DX: Fracture of one rib, unspecified side, initial encounter for closed fracture: S22.39XA

## 2018-03-15 HISTORY — PX: INSERTION OF MESH: SHX5868

## 2018-03-15 LAB — GLUCOSE, CAPILLARY
GLUCOSE-CAPILLARY: 85 mg/dL (ref 70–99)
GLUCOSE-CAPILLARY: 103 mg/dL — AB (ref 70–99)

## 2018-03-15 SURGERY — REPAIR, HERNIA, UMBILICAL, ADULT
Anesthesia: General | Site: Abdomen

## 2018-03-15 MED ORDER — ONDANSETRON HCL 4 MG/2ML IJ SOLN: 4.0000 mg | Freq: Once | INTRAMUSCULAR | Status: DC | PRN

## 2018-03-15 MED ORDER — FENTANYL CITRATE (PF) 100 MCG/2ML IJ SOLN
INTRAMUSCULAR | Status: DC | PRN
  Administered 2018-03-15 (×3): 25 ug via INTRAVENOUS

## 2018-03-15 MED ORDER — OXYCODONE HCL 5 MG PO TABS
5.0000 mg | ORAL_TABLET | Freq: Once | ORAL | Status: AC | PRN
  Administered 2018-03-15: 5 mg via ORAL

## 2018-03-15 MED ORDER — FENTANYL CITRATE (PF) 100 MCG/2ML IJ SOLN
INTRAMUSCULAR | Status: AC
  Filled 2018-03-15: qty 2

## 2018-03-15 MED ORDER — DEXAMETHASONE SODIUM PHOSPHATE 10 MG/ML IJ SOLN
INTRAMUSCULAR | Status: DC | PRN
  Administered 2018-03-15: 10 mg via INTRAVENOUS

## 2018-03-15 MED ORDER — OXYCODONE HCL 5 MG/5ML PO SOLN: 5.0000 mg | Freq: Once | ORAL | Status: AC | PRN

## 2018-03-15 MED ORDER — LIDOCAINE 2% (20 MG/ML) 5 ML SYRINGE
INTRAMUSCULAR | Status: DC | PRN
  Administered 2018-03-15: 50 mg via INTRAVENOUS

## 2018-03-15 MED ORDER — PROPOFOL 10 MG/ML IV BOLUS
INTRAVENOUS | Status: DC | PRN
  Administered 2018-03-15: 200 mg via INTRAVENOUS

## 2018-03-15 MED ORDER — FENTANYL CITRATE (PF) 100 MCG/2ML IJ SOLN
25.0000 ug | INTRAMUSCULAR | Status: DC | PRN
  Administered 2018-03-15 (×2): 50 ug via INTRAVENOUS

## 2018-03-15 MED ORDER — ONDANSETRON HCL 4 MG/2ML IJ SOLN
INTRAMUSCULAR | Status: DC | PRN
  Administered 2018-03-15: 4 mg via INTRAVENOUS

## 2018-03-15 MED ORDER — LACTATED RINGERS IV SOLN
INTRAVENOUS | Status: DC
  Administered 2018-03-15: 08:00:00 via INTRAVENOUS

## 2018-03-15 MED ORDER — CHLORHEXIDINE GLUCONATE CLOTH 2 % EX PADS: 6.0000 | MEDICATED_PAD | Freq: Once | CUTANEOUS | Status: DC

## 2018-03-15 MED ORDER — SODIUM CHLORIDE 0.9 % IV SOLN
INTRAVENOUS | Status: DC | PRN
  Administered 2018-03-15: 50 ug/min via INTRAVENOUS

## 2018-03-15 MED ORDER — DEXTROSE 5 % IV SOLN
INTRAVENOUS | Status: DC | PRN
  Administered 2018-03-15: 3 g via INTRAVENOUS

## 2018-03-15 MED ORDER — BUPIVACAINE-EPINEPHRINE (PF) 0.25% -1:200000 IJ SOLN
INTRAMUSCULAR | Status: AC
  Filled 2018-03-15: qty 30

## 2018-03-15 MED ORDER — ACETAMINOPHEN 160 MG/5ML PO SOLN: 325.0000 mg | ORAL | Status: DC | PRN

## 2018-03-15 MED ORDER — PHENYLEPHRINE HCL 10 MG/ML IJ SOLN
INTRAMUSCULAR | Status: DC | PRN
  Administered 2018-03-15: 40 ug via INTRAVENOUS
  Administered 2018-03-15 (×2): 80 ug via INTRAVENOUS

## 2018-03-15 MED ORDER — LACTATED RINGERS IV SOLN
INTRAVENOUS | Status: DC | PRN
  Administered 2018-03-15 (×2): via INTRAVENOUS

## 2018-03-15 MED ORDER — OXYCODONE HCL 5 MG PO TABS
ORAL_TABLET | ORAL | Status: AC
  Filled 2018-03-15: qty 1

## 2018-03-15 MED ORDER — PROPOFOL 10 MG/ML IV BOLUS
INTRAVENOUS | Status: AC
  Filled 2018-03-15: qty 20

## 2018-03-15 MED ORDER — ACETAMINOPHEN 325 MG PO TABS: 325.0000 mg | ORAL_TABLET | ORAL | Status: DC | PRN

## 2018-03-15 MED ORDER — TRAMADOL HCL 50 MG PO TABS: 50.0000 mg | ORAL_TABLET | Freq: Four times a day (QID) | ORAL | 0 refills | Status: DC | PRN

## 2018-03-15 MED ORDER — MIDAZOLAM HCL 2 MG/2ML IJ SOLN
INTRAMUSCULAR | Status: AC
  Filled 2018-03-15: qty 2

## 2018-03-15 MED ORDER — MEPERIDINE HCL 50 MG/ML IJ SOLN: 6.2500 mg | INTRAMUSCULAR | Status: DC | PRN

## 2018-03-15 MED ORDER — EPHEDRINE SULFATE 50 MG/ML IJ SOLN
INTRAMUSCULAR | Status: DC | PRN
  Administered 2018-03-15: 5 mg via INTRAVENOUS

## 2018-03-15 MED ORDER — BUPIVACAINE-EPINEPHRINE 0.25% -1:200000 IJ SOLN
INTRAMUSCULAR | Status: DC | PRN
  Administered 2018-03-15: 14 mL

## 2018-03-15 MED ORDER — MIDAZOLAM HCL 5 MG/5ML IJ SOLN
INTRAMUSCULAR | Status: DC | PRN
  Administered 2018-03-15: 2 mg via INTRAVENOUS

## 2018-03-15 MED ORDER — FENTANYL CITRATE (PF) 250 MCG/5ML IJ SOLN
INTRAMUSCULAR | Status: AC
  Filled 2018-03-15: qty 5

## 2018-03-15 MED ORDER — 0.9 % SODIUM CHLORIDE (POUR BTL) OPTIME
TOPICAL | Status: DC | PRN
  Administered 2018-03-15: 1000 mL

## 2018-03-15 SURGICAL SUPPLY — 41 items
ADH SKN CLS APL DERMABOND .7 (GAUZE/BANDAGES/DRESSINGS) ×1
BLADE CLIPPER SURG (BLADE) ×2 IMPLANT
BLADE SURG 10 STRL SS (BLADE) ×3 IMPLANT
BLADE SURG 15 STRL LF DISP TIS (BLADE) ×1 IMPLANT
BLADE SURG 15 STRL SS (BLADE) ×3
CANISTER SUCT 3000ML PPV (MISCELLANEOUS) IMPLANT
CHLORAPREP W/TINT 26ML (MISCELLANEOUS) ×3 IMPLANT
COVER SURGICAL LIGHT HANDLE (MISCELLANEOUS) ×3 IMPLANT
DECANTER SPIKE VIAL GLASS SM (MISCELLANEOUS) ×3 IMPLANT
DERMABOND ADVANCED (GAUZE/BANDAGES/DRESSINGS) ×2
DERMABOND ADVANCED .7 DNX12 (GAUZE/BANDAGES/DRESSINGS) ×1 IMPLANT
DRAPE LAPAROTOMY 100X72 PEDS (DRAPES) ×3 IMPLANT
DRAPE UTILITY XL STRL (DRAPES) ×3 IMPLANT
ELECT CAUTERY BLADE 6.4 (BLADE) ×3 IMPLANT
ELECT REM PT RETURN 9FT ADLT (ELECTROSURGICAL) ×3
ELECTRODE REM PT RTRN 9FT ADLT (ELECTROSURGICAL) ×1 IMPLANT
GLOVE SURG SIGNA 7.5 PF LTX (GLOVE) ×3 IMPLANT
GOWN STRL REUS W/ TWL LRG LVL3 (GOWN DISPOSABLE) ×1 IMPLANT
GOWN STRL REUS W/ TWL XL LVL3 (GOWN DISPOSABLE) ×1 IMPLANT
GOWN STRL REUS W/TWL LRG LVL3 (GOWN DISPOSABLE) ×3
GOWN STRL REUS W/TWL XL LVL3 (GOWN DISPOSABLE) ×3
KIT BASIN OR (CUSTOM PROCEDURE TRAY) ×3 IMPLANT
KIT TURNOVER KIT B (KITS) ×3 IMPLANT
MESH VENTRALEX ST 1-7/10 CRC S (Mesh General) ×2 IMPLANT
NDL HYPO 25GX1X1/2 BEV (NEEDLE) ×1 IMPLANT
NEEDLE HYPO 25GX1X1/2 BEV (NEEDLE) ×3 IMPLANT
NS IRRIG 1000ML POUR BTL (IV SOLUTION) ×3 IMPLANT
PACK SURGICAL SETUP 50X90 (CUSTOM PROCEDURE TRAY) ×3 IMPLANT
PAD ARMBOARD 7.5X6 YLW CONV (MISCELLANEOUS) ×3 IMPLANT
PENCIL BUTTON HOLSTER BLD 10FT (ELECTRODE) ×3 IMPLANT
SPONGE LAP 18X18 X RAY DECT (DISPOSABLE) ×3 IMPLANT
SUT MNCRL AB 4-0 PS2 18 (SUTURE) ×3 IMPLANT
SUT NOVA NAB DX-16 0-1 5-0 T12 (SUTURE) ×5 IMPLANT
SUT VIC AB 3-0 SH 27 (SUTURE) ×3
SUT VIC AB 3-0 SH 27X BRD (SUTURE) ×1 IMPLANT
SYR CONTROL 10ML LL (SYRINGE) ×3 IMPLANT
TOWEL OR 17X24 6PK STRL BLUE (TOWEL DISPOSABLE) ×3 IMPLANT
TOWEL OR 17X26 10 PK STRL BLUE (TOWEL DISPOSABLE) ×3 IMPLANT
TUBE CONNECTING 12'X1/4 (SUCTIONS) ×1
TUBE CONNECTING 12X1/4 (SUCTIONS) ×1 IMPLANT
YANKAUER SUCT BULB TIP NO VENT (SUCTIONS) ×2 IMPLANT

## 2018-03-15 NOTE — Anesthesia Procedure Notes (Signed)
Procedure Name: LMA Insertion Date/Time: 03/15/2018 8:30 AM Performed by: Neldon Newport, CRNA Pre-anesthesia Checklist: Timeout performed, Patient being monitored, Suction available, Emergency Drugs available and Patient identified Patient Re-evaluated:Patient Re-evaluated prior to induction Oxygen Delivery Method: Circle system utilized Preoxygenation: Pre-oxygenation with 100% oxygen Induction Type: IV induction Ventilation: Mask ventilation without difficulty LMA: LMA inserted LMA Size: 5.0 Number of attempts: 1 Placement Confirmation: positive ETCO2 and breath sounds checked- equal and bilateral Tube secured with: Tape Dental Injury: Teeth and Oropharynx as per pre-operative assessment

## 2018-03-15 NOTE — Op Note (Signed)
UMBILICAL HERNIA REPAIR WITH MESH, INSERTION OF MESH  Procedure Note  Leonard [name] 03/15/2018   Pre-op Diagnosis: Umbilical hernia     Post-op Diagnosis: same  Procedure(s): UMBILICAL HERNIA REPAIR WITH MESH INSERTION OF MESH (4.3 cm round ventral patch)  Surgeon(s): Coralie Keens, MD  Anesthesia: General  Staff:  Circulator: Nicholos Johns, RN Scrub Person: Cordie Grice, RN; Teschner, Mindy K, CST  Estimated Blood Loss: Minimal               Procedure: The patient was brought to the operating room and identified as correct patient.  He was placed supine on the operating table general anesthesia was induced.  His abdomen was then prepped and draped in the usual sterile fashion.  I anesthetized the skin of the lower edge of the umbilicus with Marcaine.  I then made [name] transverse incision with [name] scalpel.  I took this down to the hernia sac which was separated from the overlying umbilical skin.  The sac contained only preperitoneal fat.  I excised the sac at the base along the fascia.  The fascial defect was approximately 1 cm in size.  I brought [name] 4.3 cm round ventral patch from Bard onto the field.  I placed through the fascial opening and then pulled up against the peritoneum with the ties.  I then sutured the mesh in place with #1 Novafil sutures.  I then cut the ties and then closed the fascia over the top of the mesh with [name] #1 Novafil suture.  Wide coverage of the fascial defect appeared to be achieved.  I then anesthetized the fascia further with Marcaine.  I then closed the subtenons tissue with interrupted 3-0 Vicryl sutures and closed the skin with [name] 4-0 Monocryl suture.  Dermabond was then applied.  The patient tolerated the procedure well.  All the counts were correct at the end of the procedure.  The patient was then extubated in the operating room and taken in [name] stable condition to the recovery room.          Leonard [name]   Date: 03/15/2018  Time:  9:05 AM

## 2018-03-15 NOTE — Anesthesia Postprocedure Evaluation (Signed)
Anesthesia Post Note  Patient: KHAMARION BJELLAND  Procedure(s) Performed: UMBILICAL HERNIA REPAIR WITH MESH (N/A Abdomen) INSERTION OF MESH (N/A Abdomen)     Patient location during evaluation: PACU Anesthesia Type: General Level of consciousness: awake and alert Pain management: pain level controlled Vital Signs Assessment: post-procedure vital signs reviewed and stable Respiratory status: spontaneous breathing, nonlabored ventilation, respiratory function stable and patient connected to nasal cannula oxygen Cardiovascular status: blood pressure returned to baseline and stable Postop Assessment: no apparent nausea or vomiting Anesthetic complications: no    Last Vitals:  Vitals:   03/15/18 0940 03/15/18 0948  BP: 138/80 (!) 155/90  Pulse: 71 66  Resp: 13 15  Temp: 36.6 C   SpO2: 99% 96%    Last Pain:  Vitals:   03/15/18 0948  TempSrc:   PainSc: 0-No pain                 Kreston Ahrendt

## 2018-03-15 NOTE — Interval H&P Note (Signed)
History and Physical Interval Note:no change in H and P  03/15/2018 8:05 AM  Leonard [name]  has presented today for surgery, with the diagnosis of Umbilical hernia  The various methods of treatment have been discussed with the patient and family. After consideration of risks, benefits and other options for treatment, the patient has consented to  Procedure(s): UMBILICAL HERNIA REPAIR WITH MESH (N/A) INSERTION OF MESH (N/A) as a surgical intervention .  The patient's history has been reviewed, patient examined, no change in status, stable for surgery.  I have reviewed the patient's chart and labs.  Questions were answered to the patient's satisfaction.     [name] A

## 2018-03-15 NOTE — Transfer of Care (Signed)
Immediate Anesthesia Transfer of Care Note  Patient: Leonard [name]  Procedure(s) Performed: UMBILICAL HERNIA REPAIR WITH MESH (N/A Abdomen) INSERTION OF MESH (N/A Abdomen)  Patient Location: PACU  Anesthesia Type:General  Level of Consciousness: awake, alert  and oriented  Airway & Oxygen Therapy: Patient Spontanous Breathing and Patient connected to nasal cannula oxygen  Post-op Assessment: Report given to RN, Post -op Vital signs reviewed and stable and Patient moving all extremities X 4  Post vital signs: Reviewed and stable  Last Vitals:  Vitals Value Taken Time  BP 122/73 03/15/2018  9:12 AM  Temp    Pulse 83 03/15/2018  9:12 AM  Resp 20 03/15/2018  9:12 AM  SpO2 95 % 03/15/2018  9:12 AM  Vitals shown include unvalidated device data.  Last Pain:  Vitals:   03/15/18 0739  TempSrc:   PainSc: 0-No pain         Complications: No apparent anesthesia complications

## 2018-03-15 NOTE — Discharge Instructions (Signed)
[facility] _______[facility] ° °UMBILICAL OR INGUINAL HERNIA REPAIR: POST OP INSTRUCTIONS ° °Always review your discharge instruction sheet given to you by the facility where your surgery was performed. °IF YOU HAVE DISABILITY OR FAMILY LEAVE FORMS, YOU MUST BRING THEM TO THE OFFICE FOR PROCESSING.   °DO NOT GIVE THEM TO YOUR DOCTOR. ° °1. A  prescription for pain medication may be given to you upon discharge.  Take your pain medication as prescribed, if needed.  If narcotic pain medicine is not needed, then you may take acetaminophen (Tylenol) or ibuprofen (Advil) as needed. °2. Take your usually prescribed medications unless otherwise directed. °If you need a refill on your pain medication, please contact your pharmacy.  They will contact our office to request authorization. Prescriptions will not be filled after 5 pm or on week-ends. °3. You should follow a light diet the first 24 hours after arrival home, such as soup and crackers, etc.  Be sure to include lots of fluids daily.  Resume your normal diet the day after surgery. °4.Most patients will experience some swelling and bruising around the umbilicus or in the groin and scrotum.  Ice packs and reclining will help.  Swelling and bruising can take several days to resolve.  °6. It is common to experience some constipation if taking pain medication after surgery.  Increasing fluid intake and taking a stool softener (such as Colace) will usually help or prevent this problem from occurring.  A mild laxative (Milk of Magnesia or Miralax) should be taken according to package directions if there are no bowel movements after 48 hours. °7. Unless discharge instructions indicate otherwise, you may remove your bandages 24-48 hours after surgery, and you may shower at that time.  You may have steri-strips (small skin tapes) in place directly over the incision.  These strips should be left on the skin for 7-10 days.  If your surgeon used skin glue on the  incision, you may shower in 24 hours.  The glue will flake off over the next 2-3 weeks.  Any sutures or staples will be removed at the office during your follow-up visit. °8. ACTIVITIES:  You may resume regular (light) daily activities beginning the next day--such as daily self-care, walking, climbing stairs--gradually increasing activities as tolerated.  You may have sexual intercourse when it is comfortable.  Refrain from any heavy lifting or straining until approved by your doctor. ° °a.You may drive when you are no longer taking prescription pain medication, you can comfortably wear a seatbelt, and you can safely maneuver your car and apply brakes. °b.RETURN TO WORK:   °_____________________________________________ ° °9.You should see your doctor in the office for a follow-up appointment approximately 2-3 weeks after your surgery.  Make sure that you call for this appointment within a day or two after you arrive home to insure a convenient appointment time. °10.OTHER INSTRUCTIONS: __OK TO SHOWER STARTING TOMORROW °ICE PACK, TYLENOL, IBUPROFEN ALSO FOR PAIN °NO LIFTING MORE THAN 15 POUNDS FOR 4 WEEKS_______________________ °   _____________________________________ ° °WHEN TO CALL YOUR DOCTOR: °1. Fever over 101.0 °2. Inability to urinate °3. Nausea and/or vomiting °4. Extreme swelling or bruising °5. Continued bleeding from incision. °6. Increased pain, redness, or drainage from the incision ° °The clinic staff is available to answer your questions during regular business hours.  Please don’t hesitate to call and ask to speak to one of the nurses for clinical concerns.  If you have a medical emergency, go to the nearest emergency   room or call 911.  A surgeon from [facility] is always on call at the hospital ° ° °[address], [address], [place], [place]  [address] ? ° [address] [address], [place], [place]   [address] °[phone] ? [phone] ? FAX [phone] °Web site:  [email or web address] °

## 2018-03-16 ENCOUNTER — Encounter (HOSPITAL_COMMUNITY): Payer: Self-pay | Admitting: Surgery

## 2018-03-17 ENCOUNTER — Ambulatory Visit (INDEPENDENT_AMBULATORY_CARE_PROVIDER_SITE_OTHER): Payer: Self-pay | Admitting: Physician Assistant

## 2018-04-03 ENCOUNTER — Ambulatory Visit (INDEPENDENT_AMBULATORY_CARE_PROVIDER_SITE_OTHER): Payer: BC Managed Care – PPO | Admitting: Orthopedic Surgery

## 2018-04-28 ENCOUNTER — Other Ambulatory Visit: Payer: Self-pay | Admitting: Family Medicine

## 2018-04-28 ENCOUNTER — Telehealth: Payer: Self-pay

## 2018-04-28 DIAGNOSIS — E119 Type 2 diabetes mellitus without complications: Secondary | ICD-10-CM

## 2018-04-28 DIAGNOSIS — E1165 Type 2 diabetes mellitus with hyperglycemia: Secondary | ICD-10-CM

## 2018-04-28 MED ORDER — BASAGLAR KWIKPEN 100 UNIT/ML ~~LOC~~ SOPN: 80.0000 [IU] | PEN_INJECTOR | Freq: Every day | SUBCUTANEOUS | 5 refills | Status: DC

## 2018-04-28 NOTE — Telephone Encounter (Signed)
Referral entered.    Copied from [place] [phone]. Topic: Referral - Request for Referral >> Apr 28, 2018  9:45 AM Virl Axe D wrote: Has patient seen PCP for this complaint? Yes *If NO, is insurance requiring patient see PCP for this issue before PCP can refer them? Referral for which specialty: Endocrinology Preferred provider/office: [place] Reason for referral: Pt states he has tried a few local Endocrinologists but they have not contacted him and he would like to be referred to [facility]. He has already spoken with them and Dr. Ashley Mariner [phone]) has availability to see him in the next 4 weeks. They will need 6-12 months of documentation related to his Diabetes. Please fax to [phone]  Attn Volney Presser

## 2018-04-28 NOTE — Telephone Encounter (Signed)
Copied from [place] [phone]. Topic: Quick Communication - Rx Refill/Question >> Apr 28, 2018  9:54 AM Virl Axe D wrote: Medication: Insulin Glargine (BASAGLAR KWIKPEN) 100 UNIT/ML SOPN / Pt is completely out. States that he has been able to control his diabetes a lot better in the last month with 80 units of Basaglar and diet. Requesting 80 units instead of 60.  Has the patient contacted their pharmacy? Yes.   (Agent: If no, request that the patient contact the pharmacy for the refill.) (Agent: If yes, when and what did the pharmacy advise?)  Preferred Pharmacy (with phone number or street name): [place], [address] [phone] (Phone) [phone] (Fax)    Agent: Please be advised that RX refills may take up to 3 business days. We ask that you follow-up with your pharmacy.

## 2018-04-28 NOTE — Telephone Encounter (Signed)
Dr. Ethelene Hal - patient has been able to control his blood sugars better by using 80 units. Okay to change Rx?

## 2018-04-28 NOTE — Telephone Encounter (Signed)
Requested medication (s) are due for refill today: Yes  Requested medication (s) are on the active medication list: Yes  Last refill:  12/06/17  Future visit scheduled: No  Notes to clinic:  Patient requesting a new prescription with higher dose, see notes.     Requested Prescriptions  Pending Prescriptions Disp Refills   Insulin Glargine (BASAGLAR KWIKPEN) 100 UNIT/ML SOPN 15 mL 5    Sig: Inject 0.6 mLs (60 Units total) into the skin at bedtime.     Endocrinology:  Diabetes - Insulins Failed - 04/28/2018 10:02 AM      Failed - HBA1C is between 0 and 7.9 and within 180 days    Hgb A1c MFr Bld  Date Value Ref Range Status  03/07/2018 8.7 (H) 4.8 - 5.6 % Final    Comment:    (NOTE) Pre diabetes:          5.7%-6.4% Diabetes:              >6.4% Glycemic control for   <7.0% adults with diabetes          Passed - Valid encounter within last 6 months    Recent Outpatient Visits          4 months ago Labile blood glucose   [facility], Mortimer Fries, MD   5 months ago Uncontrolled type 2 diabetes mellitus with hyperglycemia [facility])   [facility], Mortimer Fries, MD   5 months ago Essential hypertension   [facility] Ethelene Hal, Mortimer Fries, MD   1 year ago Controlled type 2 diabetes mellitus without complication, without long-term current use of insulin ([place])   [facility] [place], [place]   1 year ago Controlled type 2 diabetes mellitus without complication, without long-term current use of insulin ([place])   [facility] at [place], [place]

## 2018-05-01 NOTE — Telephone Encounter (Signed)
Faxed referral per patient request to [facility] - Dr. Bertell Maria Fax # [phone] # [phone] with office notes, labs, demographics and insurance card. Waiting on appointment.

## 2018-05-16 ENCOUNTER — Telehealth: Payer: Self-pay

## 2018-05-16 NOTE — Telephone Encounter (Signed)
Copied from [place] [phone]. Topic: General - Other >> May 16, 2018  8:23 AM Parke Poisson wrote: Reason for CRM: Pt would like to know when Dr Ethelene Hal would like to see him back in the office for a f/u.He was seen by his endocrinologist 05/09/18.He would qlso like to get his flu shot at time of this visit

## 2018-05-17 NOTE — Telephone Encounter (Signed)
Schedule patient for an ov.

## 2018-05-17 NOTE — Telephone Encounter (Signed)
Appointment scheduled for 11/25 at 8:00am.

## 2018-05-22 ENCOUNTER — Encounter: Payer: Self-pay | Admitting: Family Medicine

## 2018-05-22 ENCOUNTER — Ambulatory Visit: Payer: BC Managed Care – PPO | Admitting: Family Medicine

## 2018-05-22 VITALS — BP 128/70 | HR 81 | Ht 72.0 in | Wt 259.1 lb

## 2018-05-22 DIAGNOSIS — H6123 Impacted cerumen, bilateral: Secondary | ICD-10-CM | POA: Diagnosis not present

## 2018-05-22 DIAGNOSIS — I10 Essential (primary) hypertension: Secondary | ICD-10-CM | POA: Diagnosis not present

## 2018-05-22 DIAGNOSIS — E7800 Pure hypercholesterolemia, unspecified: Secondary | ICD-10-CM

## 2018-05-22 DIAGNOSIS — Z23 Encounter for immunization: Secondary | ICD-10-CM

## 2018-05-22 MED ORDER — EAR WAX CLEANSING 6.5 % OT KIT: PACK | OTIC | 2 refills | Status: DC
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
## 2018-05-23 ENCOUNTER — Other Ambulatory Visit: Payer: BC Managed Care – PPO

## 2018-06-13 ENCOUNTER — Other Ambulatory Visit: Payer: Self-pay

## 2018-06-13 DIAGNOSIS — E119 Type 2 diabetes mellitus without complications: Secondary | ICD-10-CM

## 2018-06-13 MED ORDER — BASAGLAR KWIKPEN 100 UNIT/ML ~~LOC~~ SOPN: 60.0000 [IU] | PEN_INJECTOR | Freq: Every day | SUBCUTANEOUS | 5 refills | Status: DC

## 2018-06-13 MED ORDER — INSULIN PEN NEEDLE 32G X 4 MM MISC: 2 refills | Status: DC

## 2018-07-24 ENCOUNTER — Encounter: Payer: Self-pay | Admitting: Family Medicine

## 2018-07-24 LAB — HM DIABETES EYE EXAM

## 2018-08-01 ENCOUNTER — Other Ambulatory Visit: Payer: Self-pay | Admitting: Family Medicine

## 2018-08-10 ENCOUNTER — Telehealth (INDEPENDENT_AMBULATORY_CARE_PROVIDER_SITE_OTHER): Payer: Self-pay | Admitting: Orthopedic Surgery

## 2018-08-10 NOTE — Telephone Encounter (Signed)
Patient called stating that he would like to cancel his surgery that is scheduled with Dr. Sharol Given on February 26.  Please call patient to let him know that his surgery has been cancelled.  CB#[phone].  Thank you.

## 2018-08-11 NOTE — Telephone Encounter (Signed)
I spoke with Leonard [name].  He needs to cancel surgery because he will be traveling to [place] and is unsure of when he will return.  He will call when he is ready to reschedule.

## 2018-08-23 ENCOUNTER — Ambulatory Visit (HOSPITAL_COMMUNITY): Admit: 2018-08-23 | Payer: BC Managed Care – PPO | Admitting: Orthopedic Surgery

## 2018-08-23 ENCOUNTER — Encounter (HOSPITAL_COMMUNITY): Payer: Self-pay

## 2018-08-23 SURGERY — REMOVAL, HARDWARE
Anesthesia: Choice | Laterality: Left

## 2018-11-02 ENCOUNTER — Other Ambulatory Visit: Payer: Self-pay | Admitting: Family Medicine

## 2018-11-02 DIAGNOSIS — E119 Type 2 diabetes mellitus without complications: Secondary | ICD-10-CM

## 2018-11-02 DIAGNOSIS — E1165 Type 2 diabetes mellitus with hyperglycemia: Secondary | ICD-10-CM

## 2018-11-27 ENCOUNTER — Encounter: Payer: Self-pay | Admitting: Family Medicine

## 2018-11-27 ENCOUNTER — Telehealth (INDEPENDENT_AMBULATORY_CARE_PROVIDER_SITE_OTHER): Payer: BC Managed Care – PPO | Admitting: Family Medicine

## 2018-11-27 VITALS — BP 145/87 | HR 87 | Temp 96.4°F | Ht 72.0 in | Wt 250.0 lb

## 2018-11-27 DIAGNOSIS — R7309 Other abnormal glucose: Secondary | ICD-10-CM | POA: Diagnosis not present

## 2018-11-27 DIAGNOSIS — Z91199 Patient's noncompliance with other medical treatment and regimen due to unspecified reason: Secondary | ICD-10-CM

## 2018-11-27 DIAGNOSIS — E7800 Pure hypercholesterolemia, unspecified: Secondary | ICD-10-CM | POA: Diagnosis not present

## 2018-11-27 DIAGNOSIS — E782 Mixed hyperlipidemia: Secondary | ICD-10-CM | POA: Diagnosis not present

## 2018-11-27 DIAGNOSIS — I10 Essential (primary) hypertension: Secondary | ICD-10-CM | POA: Diagnosis not present

## 2018-11-27 DIAGNOSIS — Z9119 Patient's noncompliance with other medical treatment and regimen: Secondary | ICD-10-CM

## 2018-11-27 MED ORDER — CLONIDINE HCL 0.1 MG PO TABS: 0.1000 mg | ORAL_TABLET | Freq: Two times a day (BID) | ORAL | 1 refills | Status: DC
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
## 2018-11-28 ENCOUNTER — Other Ambulatory Visit (INDEPENDENT_AMBULATORY_CARE_PROVIDER_SITE_OTHER): Payer: BC Managed Care – PPO

## 2018-11-28 DIAGNOSIS — R7309 Other abnormal glucose: Secondary | ICD-10-CM

## 2018-11-28 DIAGNOSIS — I10 Essential (primary) hypertension: Secondary | ICD-10-CM

## 2018-11-28 DIAGNOSIS — E7800 Pure hypercholesterolemia, unspecified: Secondary | ICD-10-CM | POA: Diagnosis not present

## 2018-11-28 LAB — MICROALBUMIN / CREATININE URINE RATIO
Creatinine,U: 120.8 mg/dL
Microalb Creat Ratio: 7.7 mg/g (ref 0.0–30.0)
Microalb, Ur: 9.3 mg/dL — ABNORMAL HIGH (ref 0.0–1.9)

## 2018-11-28 LAB — CBC
HCT: 44.1 % (ref 39.0–52.0)
Hemoglobin: 15.2 g/dL (ref 13.0–17.0)
MCHC: 34.5 g/dL (ref 30.0–36.0)
MCV: 85.8 fl (ref 78.0–100.0)
Platelets: 325 10*3/uL (ref 150.0–400.0)
RBC: 5.14 Mil/uL (ref 4.22–5.81)
RDW: 14.7 % (ref 11.5–15.5)
WBC: 7.9 10*3/uL (ref 4.0–10.5)

## 2018-11-28 LAB — LIPID PANEL
Cholesterol: 113 mg/dL (ref 0–200)
HDL: 32 mg/dL — ABNORMAL LOW (ref >39.00)
LDL Cholesterol: 47 mg/dL (ref 0–99)
NonHDL: 81.08
Total CHOL/HDL Ratio: 4
Triglycerides: 168 mg/dL — ABNORMAL HIGH (ref 0.0–149.0)
VLDL: 33.6 mg/dL (ref 0.0–40.0)

## 2018-11-28 LAB — COMPREHENSIVE METABOLIC PANEL
ALT: 27 U/L (ref 0–53)
AST: 20 U/L (ref 0–37)
Albumin: 4.2 g/dL (ref 3.5–5.2)
Alkaline Phosphatase: 90 U/L (ref 39–117)
BUN: 22 mg/dL (ref 6–23)
CO2: 25 mEq/L (ref 19–32)
Calcium: 9.3 mg/dL (ref 8.4–10.5)
Chloride: 103 mEq/L (ref 96–112)
Creatinine, Ser: 1.1 mg/dL (ref 0.40–1.50)
GFR: 67.65 mL/min (ref >60.00)
Glucose, Bld: 107 mg/dL — ABNORMAL HIGH (ref 70–99)
Potassium: 4.3 mEq/L (ref 3.5–5.1)
Sodium: 137 mEq/L (ref 135–145)
Total Bilirubin: 0.5 mg/dL (ref 0.2–1.2)
Total Protein: 7 g/dL (ref 6.0–8.3)

## 2018-11-28 LAB — URINALYSIS, ROUTINE W REFLEX MICROSCOPIC
Bilirubin Urine: NEGATIVE
Hgb urine dipstick: NEGATIVE
Ketones, ur: NEGATIVE
Leukocytes,Ua: NEGATIVE
Nitrite: NEGATIVE
RBC / HPF: NONE SEEN (ref 0–?)
Specific Gravity, Urine: 1.025 (ref 1.000–1.030)
Urine Glucose: >=1000 — AB
Urobilinogen, UA: 0.2 (ref 0.0–1.0)
pH: 5.5 (ref 5.0–8.0)

## 2018-11-28 LAB — HEMOGLOBIN A1C: Hgb A1c MFr Bld: 7.2 % — ABNORMAL HIGH (ref 4.6–6.5)

## 2018-11-28 LAB — TSH: TSH: 1.18 u[IU]/mL (ref 0.35–4.50)

## 2018-12-09 ENCOUNTER — Other Ambulatory Visit: Payer: Self-pay | Admitting: Family Medicine

## 2018-12-12 ENCOUNTER — Other Ambulatory Visit: Payer: Self-pay | Admitting: Family Medicine

## 2018-12-12 DIAGNOSIS — E1165 Type 2 diabetes mellitus with hyperglycemia: Secondary | ICD-10-CM

## 2018-12-22 ENCOUNTER — Encounter: Payer: Self-pay | Admitting: Family Medicine

## 2018-12-27 ENCOUNTER — Ambulatory Visit: Payer: BC Managed Care – PPO | Admitting: Family Medicine

## 2018-12-28 ENCOUNTER — Ambulatory Visit (INDEPENDENT_AMBULATORY_CARE_PROVIDER_SITE_OTHER): Payer: BC Managed Care – PPO | Admitting: Family Medicine

## 2018-12-28 ENCOUNTER — Encounter: Payer: Self-pay | Admitting: Family Medicine

## 2018-12-28 VITALS — BP 130/82 | HR 96 | Ht 72.0 in | Wt 248.0 lb

## 2018-12-28 DIAGNOSIS — I10 Essential (primary) hypertension: Secondary | ICD-10-CM | POA: Diagnosis not present

## 2018-12-28 MED ORDER — METOPROLOL SUCCINATE ER 50 MG PO TB24: 50.0000 mg | ORAL_TABLET | Freq: Every day | ORAL | 0 refills | Status: DC
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
## 2019-01-16 ENCOUNTER — Other Ambulatory Visit: Payer: Self-pay | Admitting: Family Medicine

## 2019-03-08 ENCOUNTER — Ambulatory Visit: Payer: BC Managed Care – PPO | Admitting: Family Medicine

## 2019-03-12 ENCOUNTER — Ambulatory Visit: Payer: BC Managed Care – PPO | Admitting: Family Medicine

## 2019-03-12 ENCOUNTER — Encounter: Payer: Self-pay | Admitting: Family Medicine

## 2019-03-12 ENCOUNTER — Other Ambulatory Visit: Payer: Self-pay

## 2019-03-12 VITALS — BP 124/80 | HR 76 | Ht 72.0 in | Wt 255.0 lb

## 2019-03-12 DIAGNOSIS — Z23 Encounter for immunization: Secondary | ICD-10-CM | POA: Diagnosis not present

## 2019-03-12 DIAGNOSIS — M79672 Pain in left foot: Secondary | ICD-10-CM | POA: Diagnosis not present

## 2019-03-12 DIAGNOSIS — I10 Essential (primary) hypertension: Secondary | ICD-10-CM

## 2019-03-12 DIAGNOSIS — H6123 Impacted cerumen, bilateral: Secondary | ICD-10-CM

## 2019-03-12 DIAGNOSIS — E782 Mixed hyperlipidemia: Secondary | ICD-10-CM

## 2019-03-12 DIAGNOSIS — E1165 Type 2 diabetes mellitus with hyperglycemia: Secondary | ICD-10-CM

## 2019-03-12 DIAGNOSIS — E7800 Pure hypercholesterolemia, unspecified: Secondary | ICD-10-CM

## 2019-03-12 LAB — LIPID PANEL
Cholesterol: 118 mg/dL (ref 0–200)
HDL: 37.9 mg/dL — ABNORMAL LOW (ref >39.00)
LDL Cholesterol: 58 mg/dL (ref 0–99)
NonHDL: 79.91
Total CHOL/HDL Ratio: 3
Triglycerides: 112 mg/dL (ref 0.0–149.0)
VLDL: 22.4 mg/dL (ref 0.0–40.0)

## 2019-03-12 LAB — COMPREHENSIVE METABOLIC PANEL
ALT: 31 U/L (ref 0–53)
AST: 26 U/L (ref 0–37)
Albumin: 4.3 g/dL (ref 3.5–5.2)
Alkaline Phosphatase: 82 U/L (ref 39–117)
BUN: 25 mg/dL — ABNORMAL HIGH (ref 6–23)
CO2: 24 mEq/L (ref 19–32)
Calcium: 9.5 mg/dL (ref 8.4–10.5)
Chloride: 102 mEq/L (ref 96–112)
Creatinine, Ser: 1.18 mg/dL (ref 0.40–1.50)
GFR: 62.33 mL/min (ref >60.00)
Glucose, Bld: 102 mg/dL — ABNORMAL HIGH (ref 70–99)
Potassium: 3.7 mEq/L (ref 3.5–5.1)
Sodium: 136 mEq/L (ref 135–145)
Total Bilirubin: 0.9 mg/dL (ref 0.2–1.2)
Total Protein: 7.4 g/dL (ref 6.0–8.3)

## 2019-03-12 LAB — URINALYSIS, ROUTINE W REFLEX MICROSCOPIC
Bilirubin Urine: NEGATIVE
Hgb urine dipstick: NEGATIVE
Ketones, ur: NEGATIVE
Leukocytes,Ua: NEGATIVE
Nitrite: NEGATIVE
RBC / HPF: NONE SEEN (ref 0–?)
Specific Gravity, Urine: 1.01 (ref 1.000–1.030)
Total Protein, Urine: NEGATIVE
Urine Glucose: >=1000 — AB
Urobilinogen, UA: 0.2 (ref 0.0–1.0)
WBC, UA: NONE SEEN (ref 0–?)
pH: 6 (ref 5.0–8.0)

## 2019-03-12 LAB — CBC
HCT: 44 % (ref 39.0–52.0)
Hemoglobin: 15 g/dL (ref 13.0–17.0)
MCHC: 34.1 g/dL (ref 30.0–36.0)
MCV: 86.2 fl (ref 78.0–100.0)
Platelets: 291 10*3/uL (ref 150.0–400.0)
RBC: 5.1 Mil/uL (ref 4.22–5.81)
RDW: 14.9 % (ref 11.5–15.5)
WBC: 7 10*3/uL (ref 4.0–10.5)

## 2019-03-12 LAB — HEMOGLOBIN A1C: Hgb A1c MFr Bld: 6.8 % — ABNORMAL HIGH (ref 4.6–6.5)

## 2019-03-12 LAB — LDL CHOLESTEROL, DIRECT: Direct LDL: 66 mg/dL

## 2019-03-12 NOTE — Patient Instructions (Signed)
Mindfulness-Based Stress Reduction Mindfulness-based stress reduction (MBSR) is a program that helps people learn to practice mindfulness. Mindfulness is the practice of intentionally paying attention to the present moment. It can be learned and practiced through techniques such as education, breathing exercises, meditation, and yoga. MBSR includes several mindfulness techniques in one program. MBSR works best when you understand the treatment, are willing to try new things, and can commit to spending time practicing what you learn. MBSR training may include learning about:  How your emotions, thoughts, and reactions affect your body.  New ways to respond to things that cause negative thoughts to start (triggers).  How to notice your thoughts and let go of them.  Practicing awareness of everyday things that you normally do without thinking.  The techniques and goals of different types of meditation. What are the benefits of MBSR? MBSR can have many benefits, which include helping you to:  Develop self-awareness. This refers to knowing and understanding yourself.  Learn skills and attitudes that help you to participate in your own health care.  Learn new ways to care for yourself.  Be more accepting about how things are, and let things go.  Be less judgmental and approach things with an open mind.  Be patient with yourself and trust yourself more. MBSR has also been shown to:  Reduce negative emotions, such as depression and anxiety.  Improve memory and focus.  Change how you sense and approach pain.  Boost your body's ability to fight infections.  Help you connect better with other people.  Improve your sense of well-being. Follow these instructions at home:   Find a local in-person or online MBSR program.  Set aside some time regularly for mindfulness practice.  Find a mindfulness practice that works best for you. This may include one or more of the following: ?  Meditation. Meditation involves focusing your mind on a certain thought or activity. ? Breathing awareness exercises. These help you to stay present by focusing on your breath. ? Body scan. For this practice, you lie down and pay attention to each part of your body from head to toe. You can identify tension and soreness and intentionally relax parts of your body. ? Yoga. Yoga involves stretching and breathing, and it can improve your ability to move and be flexible. It can also provide an experience of testing your body's limits, which can help you release stress. ? Mindful eating. This way of eating involves focusing on the taste, texture, color, and smell of each bite of food. Because this slows down eating and helps you feel full sooner, it can be an important part of a weight-loss plan.  Find a podcast or recording that provides guidance for breathing awareness, body scan, or meditation exercises. You can listen to these any time when you have a free moment to rest without distractions.  Follow your treatment plan as told by your health care provider. This may include taking regular medicines and making changes to your diet or lifestyle as recommended. How to practice mindfulness To do a basic awareness exercise:  Find a comfortable place to sit.  Pay attention to the present moment. Observe your thoughts, feelings, and surroundings just as they are.  Avoid placing judgment on yourself, your feelings, or your surroundings. Make note of any judgment that comes up, and let it go.  Your mind may wander, and that is okay. Make note of when your thoughts drift, and return your attention to the present moment. To do   basic mindfulness meditation:  Find a comfortable place to sit. This may include a stable chair or a firm floor cushion. ? Sit upright with your back straight. Let your arms fall next to your side with your hands resting on your legs. ? If sitting in a chair, rest your feet flat on  the floor. ? If sitting on a cushion, cross your legs in front of you.  Keep your head in a neutral position with your chin dropped slightly. Relax your jaw and rest the tip of your tongue on the roof of your mouth. Drop your gaze to the floor. You can close your eyes if you like.  Breathe normally and pay attention to your breath. Feel the air moving in and out of your nose. Feel your belly expanding and relaxing with each breath.  Your mind may wander, and that is okay. Make note of when your thoughts drift, and return your attention to your breath.  Avoid placing judgment on yourself, your feelings, or your surroundings. Make note of any judgment or feelings that come up, let them go, and bring your attention back to your breath.  When you are ready, lift your gaze or open your eyes. Pay attention to how your body feels after the meditation. Where to find more information You can find more information about MBSR from:  Your health care provider.  Community-based meditation centers or programs.  Programs offered near you. Summary  Mindfulness-based stress reduction (MBSR) is a program that teaches you how to intentionally pay attention to the present moment. It is used with other treatments to help you cope better with daily stress, emotions, and pain.  MBSR focuses on developing self-awareness, which allows you to respond to life stress without judgment or negative emotions.  MBSR programs may involve learning different mindfulness practices, such as breathing exercises, meditation, yoga, body scan, or mindful eating. Find a mindfulness practice that works best for you, and set aside time for it on a regular basis. This information is not intended to replace advice given to you by your health care provider. Make sure you discuss any questions you have with your health care provider. Document Released: 10/21/2016 Document Revised: 05/27/2017 Document Reviewed: 10/21/2016 Elsevier  Patient Education  2020 Elsevier Inc.  
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
## 2019-04-24 ENCOUNTER — Other Ambulatory Visit: Payer: Self-pay | Admitting: Family Medicine

## 2019-04-24 DIAGNOSIS — I10 Essential (primary) hypertension: Secondary | ICD-10-CM

## 2019-06-01 ENCOUNTER — Other Ambulatory Visit: Payer: Self-pay | Admitting: Family Medicine

## 2019-06-01 DIAGNOSIS — E1165 Type 2 diabetes mellitus with hyperglycemia: Secondary | ICD-10-CM

## 2019-06-01 NOTE — Telephone Encounter (Signed)
Follow up with PCP in 08/2019.

## 2019-06-07 ENCOUNTER — Other Ambulatory Visit: Payer: Self-pay | Admitting: Family Medicine

## 2019-06-07 DIAGNOSIS — E119 Type 2 diabetes mellitus without complications: Secondary | ICD-10-CM

## 2019-06-28 ENCOUNTER — Encounter: Payer: Self-pay | Admitting: Family Medicine

## 2019-06-28 NOTE — Telephone Encounter (Signed)
This is a duplicate message. Responded to pt in other mychart message

## 2019-06-29 DIAGNOSIS — C73 Malignant neoplasm of thyroid gland: Secondary | ICD-10-CM

## 2019-06-29 HISTORY — DX: Malignant neoplasm of thyroid gland: C73

## 2019-07-13 ENCOUNTER — Other Ambulatory Visit: Payer: Self-pay | Admitting: Family Medicine

## 2019-07-13 DIAGNOSIS — I10 Essential (primary) hypertension: Secondary | ICD-10-CM

## 2019-07-25 LAB — HM DIABETES EYE EXAM

## 2019-08-02 ENCOUNTER — Encounter: Payer: Self-pay | Admitting: Family Medicine

## 2019-08-10 ENCOUNTER — Telehealth: Payer: Self-pay | Admitting: Family Medicine

## 2019-08-10 ENCOUNTER — Other Ambulatory Visit: Payer: Self-pay | Admitting: Family Medicine

## 2019-08-10 NOTE — Telephone Encounter (Signed)
Patient is returning the call. CB is [phone]

## 2019-08-10 NOTE — Telephone Encounter (Signed)
Pt called back and said he is using this with Basaglar. He will call back for a refill after he uses the 2 boxes he has.

## 2019-08-10 NOTE — Telephone Encounter (Signed)
Pt called stating that he does not think that metoprolol 50 mg is working because his BP is running around systolic [id number] and diastolic is running about 80s-95s. Pt also mention that Clonidine work well in past.   Also report blood sugar is good.   Dr. Ethelene Hal please advise, pt has an appt to see you on 09/10/2019--do you want to see him sooner?

## 2019-08-30 NOTE — Telephone Encounter (Signed)
No it can wait. Please ask him to bring his bp cuff with him to the visit.

## 2019-08-30 NOTE — Telephone Encounter (Signed)
Dr. Ethelene Hal please advise

## 2019-09-03 NOTE — Telephone Encounter (Signed)
Called patient to inform him that he should bring BP cuff to up coming appointment and that Dr. Ethelene Hal will discuss BP concerns at this visit. No answer LMTCB

## 2019-09-05 NOTE — Telephone Encounter (Signed)
Patient aware and will bring cuff at visit.

## 2019-09-10 ENCOUNTER — Ambulatory Visit: Payer: BC Managed Care – PPO | Admitting: Family Medicine

## 2019-09-24 ENCOUNTER — Other Ambulatory Visit: Payer: Self-pay

## 2019-09-25 ENCOUNTER — Encounter: Payer: Self-pay | Admitting: Family Medicine

## 2019-09-25 ENCOUNTER — Ambulatory Visit (INDEPENDENT_AMBULATORY_CARE_PROVIDER_SITE_OTHER): Payer: BC Managed Care – PPO | Admitting: Family Medicine

## 2019-09-25 VITALS — BP 124/76 | HR 73 | Temp 97.1°F | Ht 73.0 in | Wt 259.8 lb

## 2019-09-25 DIAGNOSIS — Z8739 Personal history of other diseases of the musculoskeletal system and connective tissue: Secondary | ICD-10-CM | POA: Insufficient documentation

## 2019-09-25 DIAGNOSIS — E119 Type 2 diabetes mellitus without complications: Secondary | ICD-10-CM

## 2019-09-25 DIAGNOSIS — I10 Essential (primary) hypertension: Secondary | ICD-10-CM | POA: Diagnosis not present

## 2019-09-25 DIAGNOSIS — E782 Mixed hyperlipidemia: Secondary | ICD-10-CM

## 2019-09-25 LAB — LIPID PANEL
Cholesterol: 108 mg/dL (ref 0–200)
HDL: 33.6 mg/dL — ABNORMAL LOW (ref >39.00)
LDL Cholesterol: 54 mg/dL (ref 0–99)
NonHDL: 74.37
Total CHOL/HDL Ratio: 3
Triglycerides: 103 mg/dL (ref 0.0–149.0)
VLDL: 20.6 mg/dL (ref 0.0–40.0)

## 2019-09-25 LAB — CBC
HCT: 43.9 % (ref 39.0–52.0)
Hemoglobin: 14.7 g/dL (ref 13.0–17.0)
MCHC: 33.5 g/dL (ref 30.0–36.0)
MCV: 87.4 fl (ref 78.0–100.0)
Platelets: 288 10*3/uL (ref 150.0–400.0)
RBC: 5.02 Mil/uL (ref 4.22–5.81)
RDW: 14.7 % (ref 11.5–15.5)
WBC: 5.7 10*3/uL (ref 4.0–10.5)

## 2019-09-25 LAB — URINALYSIS, ROUTINE W REFLEX MICROSCOPIC
Bilirubin Urine: NEGATIVE
Hgb urine dipstick: NEGATIVE
Ketones, ur: NEGATIVE
Leukocytes,Ua: NEGATIVE
Nitrite: NEGATIVE
RBC / HPF: NONE SEEN (ref 0–?)
Specific Gravity, Urine: 1.015 (ref 1.000–1.030)
Total Protein, Urine: NEGATIVE
Urine Glucose: >=1000 — AB
Urobilinogen, UA: 0.2 (ref 0.0–1.0)
WBC, UA: NONE SEEN (ref 0–?)
pH: 7 (ref 5.0–8.0)

## 2019-09-25 LAB — COMPREHENSIVE METABOLIC PANEL
ALT: 34 U/L (ref 0–53)
AST: 24 U/L (ref 0–37)
Albumin: 4.4 g/dL (ref 3.5–5.2)
Alkaline Phosphatase: 99 U/L (ref 39–117)
BUN: 22 mg/dL (ref 6–23)
CO2: 28 mEq/L (ref 19–32)
Calcium: 9.2 mg/dL (ref 8.4–10.5)
Chloride: 103 mEq/L (ref 96–112)
Creatinine, Ser: 1.15 mg/dL (ref 0.40–1.50)
GFR: 64.1 mL/min (ref >60.00)
Glucose, Bld: 92 mg/dL (ref 70–99)
Potassium: 3.9 mEq/L (ref 3.5–5.1)
Sodium: 137 mEq/L (ref 135–145)
Total Bilirubin: 0.6 mg/dL (ref 0.2–1.2)
Total Protein: 7 g/dL (ref 6.0–8.3)

## 2019-09-25 LAB — MICROALBUMIN / CREATININE URINE RATIO
Creatinine,U: 38 mg/dL
Microalb Creat Ratio: 12.4 mg/g (ref 0.0–30.0)
Microalb, Ur: 4.7 mg/dL — ABNORMAL HIGH (ref 0.0–1.9)

## 2019-09-25 LAB — LDL CHOLESTEROL, DIRECT: Direct LDL: 56 mg/dL

## 2019-09-25 LAB — HEMOGLOBIN A1C: Hgb A1c MFr Bld: 6.6 % — ABNORMAL HIGH (ref 4.6–6.5)

## 2019-09-25 LAB — URIC ACID: Uric Acid, Serum: 3.8 mg/dL — ABNORMAL LOW (ref 4.0–7.8)
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
## 2019-10-27 DEATH — deceased

## 2019-10-29 ENCOUNTER — Other Ambulatory Visit: Payer: Self-pay | Admitting: Family Medicine

## 2019-10-29 DIAGNOSIS — I10 Essential (primary) hypertension: Secondary | ICD-10-CM

## 2019-10-29 NOTE — Telephone Encounter (Signed)
Last OV 09/25/19 Last fill 07/13/19  #90/0

## 2019-11-05 ENCOUNTER — Other Ambulatory Visit: Payer: Self-pay | Admitting: Family Medicine

## 2019-11-05 NOTE — Telephone Encounter (Signed)
Last OV 09/25/2019 Last fill 12/11/18 for both meds  #90/2

## 2019-11-09 ENCOUNTER — Other Ambulatory Visit: Payer: Self-pay | Admitting: Pulmonary Disease

## 2019-12-13 ENCOUNTER — Other Ambulatory Visit: Payer: Self-pay | Admitting: Family Medicine

## 2019-12-13 DIAGNOSIS — E1165 Type 2 diabetes mellitus with hyperglycemia: Secondary | ICD-10-CM

## 2020-01-09 ENCOUNTER — Other Ambulatory Visit: Payer: Self-pay | Admitting: Family Medicine

## 2020-01-09 DIAGNOSIS — E119 Type 2 diabetes mellitus without complications: Secondary | ICD-10-CM

## 2020-01-15 ENCOUNTER — Telehealth: Payer: Self-pay | Admitting: Family Medicine

## 2020-01-15 NOTE — Telephone Encounter (Signed)
Needs to follow up with endocrinology ?

## 2020-01-15 NOTE — Telephone Encounter (Signed)
Patient is calling to speak to staff regarding his Leonard [name]. He said that we sent in 60 Units and it's supposed to be 80 units. Please call him back at [phone].

## 2020-01-15 NOTE — Telephone Encounter (Signed)
Pt aware of Dr. [name] Shaggy feedback.

## 2020-01-15 NOTE — Telephone Encounter (Signed)
12/10/19, Per Lisabeth Register note, "Basal insulin: basaglar 80U QHS. If AM sugar is >140, increase dose by 2U every 3 days until morning sugar is between 120-140. If AM sugar is <120, reduce dose by 2U every 3 days until morning sugar is between 120-140. Ok to change?

## 2020-01-28 ENCOUNTER — Other Ambulatory Visit: Payer: Self-pay | Admitting: Family Medicine

## 2020-01-28 DIAGNOSIS — I10 Essential (primary) hypertension: Secondary | ICD-10-CM

## 2020-01-28 NOTE — Telephone Encounter (Signed)
Last OV 09/25/19 Last fill 10/29/19  #90/0

## 2020-01-31 ENCOUNTER — Other Ambulatory Visit: Payer: Self-pay | Admitting: Family Medicine

## 2020-02-08 ENCOUNTER — Ambulatory Visit (HOSPITAL_BASED_OUTPATIENT_CLINIC_OR_DEPARTMENT_OTHER)
Admission: RE | Admit: 2020-02-08 | Discharge: 2020-02-08 | Disposition: A | Discharge status: Home / Self Care | Payer: BC Managed Care – PPO | Source: Ambulatory Visit | Attending: Internal Medicine | Admitting: Internal Medicine

## 2020-02-08 ENCOUNTER — Ambulatory Visit: Payer: BC Managed Care – PPO | Admitting: Internal Medicine

## 2020-02-08 ENCOUNTER — Other Ambulatory Visit: Payer: Self-pay

## 2020-02-08 VITALS — BP 116/75 | HR 79 | Resp 18 | Ht 72.0 in | Wt 252.4 lb

## 2020-02-08 DIAGNOSIS — R109 Unspecified abdominal pain: Secondary | ICD-10-CM

## 2020-02-08 DIAGNOSIS — R1032 Left lower quadrant pain: Secondary | ICD-10-CM | POA: Insufficient documentation

## 2020-02-08 LAB — POC URINALSYSI DIPSTICK (AUTOMATED)
Bilirubin, UA: NEGATIVE
Blood, UA: NEGATIVE
Glucose, UA: POSITIVE — AB
Ketones, UA: POSITIVE
Leukocytes, UA: NEGATIVE
Nitrite, UA: NEGATIVE
Protein, UA: POSITIVE — AB
Spec Grav, UA: >=1.03 — AB (ref 1.010–1.025)
Urobilinogen, UA: 0.2 E.U./dL
pH, UA: 5 (ref 5.0–8.0)

## 2020-02-08 LAB — BASIC METABOLIC PANEL
BUN: 28 mg/dL — ABNORMAL HIGH (ref 6–23)
CO2: 23 mEq/L (ref 19–32)
Calcium: 9.5 mg/dL (ref 8.4–10.5)
Chloride: 104 mEq/L (ref 96–112)
Creatinine, Ser: 1.64 mg/dL — ABNORMAL HIGH (ref 0.40–1.50)
GFR: 42.51 mL/min — ABNORMAL LOW (ref >60.00)
Glucose, Bld: 98 mg/dL (ref 70–99)
Potassium: 3.9 mEq/L (ref 3.5–5.1)
Sodium: 137 mEq/L (ref 135–145)

## 2020-02-08 LAB — CBC WITH DIFFERENTIAL/PLATELET
Basophils Absolute: 0.1 10*3/uL (ref 0.0–0.1)
Basophils Relative: 0.6 % (ref 0.0–3.0)
Eosinophils Absolute: 0.4 10*3/uL (ref 0.0–0.7)
Eosinophils Relative: 4.6 % (ref 0.0–5.0)
HCT: 46.8 % (ref 39.0–52.0)
Hemoglobin: 15.7 g/dL (ref 13.0–17.0)
Lymphocytes Relative: 29.8 % (ref 12.0–46.0)
Lymphs Abs: 2.7 10*3/uL (ref 0.7–4.0)
MCHC: 33.6 g/dL (ref 30.0–36.0)
MCV: 89.2 fl (ref 78.0–100.0)
Monocytes Absolute: 0.9 10*3/uL (ref 0.1–1.0)
Monocytes Relative: 9.8 % (ref 3.0–12.0)
Neutro Abs: 4.9 10*3/uL (ref 1.4–7.7)
Neutrophils Relative %: 55.2 % (ref 43.0–77.0)
Platelets: 320 10*3/uL (ref 150.0–400.0)
RBC: 5.25 Mil/uL (ref 4.22–5.81)
RDW: 13.9 % (ref 11.5–15.5)
WBC: 8.9 10*3/uL (ref 4.0–10.5)
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
## 2020-02-08 NOTE — Patient Instructions (Addendum)
Go to the lab and get your blood work  Go to the first floor and schedule your CT of the abdomen for later on today.  Your blood work needs to come back before the CT is done   ER if: Fever, chills, severe pain

## 2020-02-09 LAB — URINE CULTURE
MICRO NUMBER:: 10824774
Result:: NO GROWTH
SPECIMEN QUALITY:: ADEQUATE

## 2020-02-11 ENCOUNTER — Other Ambulatory Visit: Payer: Self-pay

## 2020-02-11 DIAGNOSIS — N200 Calculus of kidney: Secondary | ICD-10-CM

## 2020-03-06 ENCOUNTER — Telehealth (INDEPENDENT_AMBULATORY_CARE_PROVIDER_SITE_OTHER): Payer: BC Managed Care – PPO | Admitting: Internal Medicine

## 2020-03-06 ENCOUNTER — Other Ambulatory Visit: Payer: Self-pay

## 2020-03-06 ENCOUNTER — Telehealth: Payer: Self-pay | Admitting: Family Medicine

## 2020-03-06 VITALS — BP 140/84 | HR 70 | Temp 98.2°F | Ht 72.0 in | Wt 256.0 lb

## 2020-03-06 DIAGNOSIS — I10 Essential (primary) hypertension: Secondary | ICD-10-CM | POA: Diagnosis not present

## 2020-03-06 DIAGNOSIS — N201 Calculus of ureter: Secondary | ICD-10-CM | POA: Diagnosis not present

## 2020-03-06 DIAGNOSIS — E1165 Type 2 diabetes mellitus with hyperglycemia: Secondary | ICD-10-CM | POA: Diagnosis not present

## 2020-03-06 NOTE — Telephone Encounter (Signed)
Patient is calling and requesting a TOC from Dr. Ethelene Hal to Dr. Larose Kells, please advise. CB is [phone]
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
## 2020-03-06 NOTE — Telephone Encounter (Signed)
Okay with me 

## 2020-03-07 NOTE — Assessment & Plan Note (Signed)
Urolithiasis: At the last visit, CT showed with a kidney stone at the [facility].  Saw urology, for now they recommended fluids and observation but acknowledging that given the stone size he probably will not pass it. Follow-up with them: [place] 03/24/2020, OV 03/26/2020.  Last creatinine elevated, he knows to avoid Tylenol, he is drinking plenty of fluids, recheck BMP next week Covid exposure: 5 days ago was exposed to a person with Covid, so far asx, we agreed that he will check a Covid test, if negative he will come back next week for a BMP. DM: Based on last creatinine, Metformin decreased to 500 mg twice a day, other medications the same, CBGs not worse. HTN: Ambulatory BPs okay, less than 140/80 Follow-up: Patient decides to transfer back to this office, I accepted,  he needs to communicate with his current PCP for that.

## 2020-03-31 HISTORY — PX: CYSTOSCOPY W/ URETEROSCOPY: SUR379

## 2020-04-01 ENCOUNTER — Ambulatory Visit: Payer: BC Managed Care – PPO | Admitting: Family Medicine

## 2020-04-19 ENCOUNTER — Encounter: Payer: Self-pay | Admitting: Cardiology
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
## 2020-04-21 ENCOUNTER — Ambulatory Visit (INDEPENDENT_AMBULATORY_CARE_PROVIDER_SITE_OTHER): Payer: BC Managed Care – PPO | Admitting: Cardiology

## 2020-04-21 ENCOUNTER — Other Ambulatory Visit: Payer: Self-pay

## 2020-04-21 ENCOUNTER — Encounter: Payer: Self-pay | Admitting: Cardiology

## 2020-04-21 VITALS — BP 122/78 | HR 73 | Ht 72.0 in | Wt 256.6 lb

## 2020-04-21 DIAGNOSIS — R931 Abnormal findings on diagnostic imaging of heart and coronary circulation: Secondary | ICD-10-CM | POA: Diagnosis not present

## 2020-04-21 DIAGNOSIS — E118 Type 2 diabetes mellitus with unspecified complications: Secondary | ICD-10-CM | POA: Diagnosis not present

## 2020-04-21 DIAGNOSIS — E785 Hyperlipidemia, unspecified: Secondary | ICD-10-CM | POA: Diagnosis not present

## 2020-04-21 DIAGNOSIS — I10 Essential (primary) hypertension: Secondary | ICD-10-CM

## 2020-04-21 NOTE — Patient Instructions (Signed)
Medication Instructions:  No changes *If you need a refill on your cardiac medications before your next appointment, please call your pharmacy*   Lab Work: None ordered If you have labs (blood work) drawn today and your tests are completely normal, you will receive your results only by: [name] MyChart Message (if you have MyChart) OR . A paper copy in the mail If you have any lab test that is abnormal or we need to change your treatment, we will call you to review the results.   Testing/Procedures: Your physician has requested that you have an exercise tolerance test. For further information please visit [email or web address]. Please also follow instruction sheet, as given. This will take place at [address], [address].  Do not drink or eat foods with caffeine for 24 hours before the test. (Chocolate, coffee, tea, or energy drinks)  If you use an inhaler, bring it with you to the test.  Do not smoke for 4 hours before the test.  Wear comfortable shoes and clothing.  You will need to have the coronavirus test completed prior to your procedure.  This is a Drive Up Visit at the [address] [place]. Please tell them that you are there for pre-procedure testing. Someone will direct you to the appropriate testing line. Stay in your car and someone will be with you shortly. Please make sure to have all other labs completed before this test because you will need to stay quarantined until your procedure. Please take your insurance card to this test.     Follow-Up: At [facility], you and your health needs are our priority.  As part of our continuing mission to provide you with exceptional heart care, we have created designated Provider Care Teams.  These Care Teams include your primary Cardiologist (physician) and Advanced Practice Providers (APPs -  Physician Assistants and Nurse Practitioners) who all work together to provide you with the care you need, when you need it.  We  recommend signing up for the patient portal called "MyChart".  Sign up information is provided on this After Visit Summary.  MyChart is used to connect with patients for Virtual Visits (Telemedicine).  Patients are able to view lab/test results, encounter notes, upcoming appointments, etc.  Non-urgent messages can be sent to your provider as well.   To learn more about what you can do with MyChart, go to [email or web address].    Your next appointment:   2 year(s)  The format for your next appointment:   In Person  Provider:   Minus Breeding, MD   Other Instructions None

## 2020-05-01 ENCOUNTER — Other Ambulatory Visit: Payer: Self-pay | Admitting: Family Medicine

## 2020-05-01 DIAGNOSIS — I10 Essential (primary) hypertension: Secondary | ICD-10-CM

## 2020-05-02 ENCOUNTER — Other Ambulatory Visit (HOSPITAL_COMMUNITY)
Admission: RE | Admit: 2020-05-02 | Discharge: 2020-05-02 | Disposition: A | Discharge status: Home / Self Care | Payer: BC Managed Care – PPO | Source: Ambulatory Visit | Attending: Cardiology | Admitting: Cardiology

## 2020-05-02 ENCOUNTER — Telehealth (HOSPITAL_COMMUNITY): Payer: Self-pay | Admitting: *Deleted

## 2020-05-02 DIAGNOSIS — Z01812 Encounter for preprocedural laboratory examination: Secondary | ICD-10-CM | POA: Insufficient documentation

## 2020-05-02 DIAGNOSIS — Z20822 Contact with and (suspected) exposure to covid-19: Secondary | ICD-10-CM | POA: Diagnosis not present

## 2020-05-02 LAB — SARS CORONAVIRUS 2 (TAT 6-24 HRS): SARS Coronavirus 2: NEGATIVE

## 2020-05-02 NOTE — Telephone Encounter (Signed)
Close encounter 

## 2020-05-06 ENCOUNTER — Ambulatory Visit (HOSPITAL_BASED_OUTPATIENT_CLINIC_OR_DEPARTMENT_OTHER): Admission: RE | Admit: 2020-05-06 | Payer: BC Managed Care – PPO | Source: Ambulatory Visit

## 2020-05-06 ENCOUNTER — Ambulatory Visit (INDEPENDENT_AMBULATORY_CARE_PROVIDER_SITE_OTHER): Payer: BC Managed Care – PPO | Admitting: Internal Medicine

## 2020-05-06 ENCOUNTER — Encounter: Payer: Self-pay | Admitting: Internal Medicine

## 2020-05-06 ENCOUNTER — Ambulatory Visit (HOSPITAL_COMMUNITY)
Admission: RE | Admit: 2020-05-06 | Discharge: 2020-05-06 | Disposition: A | Discharge status: Home / Self Care | Payer: BC Managed Care – PPO | Source: Ambulatory Visit | Attending: Cardiology | Admitting: Cardiology

## 2020-05-06 ENCOUNTER — Other Ambulatory Visit: Payer: Self-pay

## 2020-05-06 VITALS — BP 134/84 | HR 79 | Temp 97.8°F | Resp 18 | Ht 72.0 in | Wt 260.1 lb

## 2020-05-06 DIAGNOSIS — Z0000 Encounter for general adult medical examination without abnormal findings: Secondary | ICD-10-CM

## 2020-05-06 DIAGNOSIS — R931 Abnormal findings on diagnostic imaging of heart and coronary circulation: Secondary | ICD-10-CM | POA: Diagnosis not present

## 2020-05-06 DIAGNOSIS — Z23 Encounter for immunization: Secondary | ICD-10-CM | POA: Diagnosis not present

## 2020-05-06 DIAGNOSIS — E119 Type 2 diabetes mellitus without complications: Secondary | ICD-10-CM

## 2020-05-06 DIAGNOSIS — I10 Essential (primary) hypertension: Secondary | ICD-10-CM | POA: Diagnosis not present

## 2020-05-06 DIAGNOSIS — E041 Nontoxic single thyroid nodule: Secondary | ICD-10-CM

## 2020-05-06 DIAGNOSIS — E782 Mixed hyperlipidemia: Secondary | ICD-10-CM | POA: Diagnosis not present

## 2020-05-06 DIAGNOSIS — E010 Iodine-deficiency related diffuse (endemic) goiter: Secondary | ICD-10-CM

## 2020-05-06 LAB — COMPREHENSIVE METABOLIC PANEL
ALT: 34 U/L (ref 0–53)
AST: 25 U/L (ref 0–37)
Albumin: 4.5 g/dL (ref 3.5–5.2)
Alkaline Phosphatase: 76 U/L (ref 39–117)
BUN: 21 mg/dL (ref 6–23)
CO2: 27 mEq/L (ref 19–32)
Calcium: 8.9 mg/dL (ref 8.4–10.5)
Chloride: 101 mEq/L (ref 96–112)
Creatinine, Ser: 1.14 mg/dL (ref 0.40–1.50)
GFR: 68.1 mL/min (ref >60.00)
Glucose, Bld: 73 mg/dL (ref 70–99)
Potassium: 3.8 mEq/L (ref 3.5–5.1)
Sodium: 136 mEq/L (ref 135–145)
Total Bilirubin: 0.5 mg/dL (ref 0.2–1.2)
Total Protein: 6.8 g/dL (ref 6.0–8.3)

## 2020-05-06 LAB — EXERCISE TOLERANCE TEST
Estimated workload: 11.2 METS
Exercise duration (min): 9 min
Exercise duration (sec): 43 s
MPHR: 156 {beats}/min
Peak HR: 144 {beats}/min
Percent HR: 92 %
Rest HR: 74 {beats}/min

## 2020-05-06 LAB — HEMOGLOBIN A1C: Hgb A1c MFr Bld: 6.9 % — ABNORMAL HIGH (ref 4.6–6.5)

## 2020-05-06 NOTE — Patient Instructions (Signed)
Check the  blood pressure every week BP GOAL is between 110/65 and  135/85. If it is consistently higher or lower, let me know  Diabetes: Check your blood sugar  2 or 3 times a day  GOALS: Fasting before a meal 70- 130 2 hours after a meal less than 180 At bedtime 90-150   GO TO THE LAB : Get the blood work     Leonard [name], Leonard [name] back for for a checkup in 3 months

## 2020-05-06 NOTE — Progress Notes (Signed)
Pre visit review using our clinic review tool, if applicable. No additional management support is needed unless otherwise documented below in the visit note. 
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
## 2020-05-07 ENCOUNTER — Ambulatory Visit (HOSPITAL_BASED_OUTPATIENT_CLINIC_OR_DEPARTMENT_OTHER): Payer: BC Managed Care – PPO

## 2020-05-07 ENCOUNTER — Encounter: Payer: Self-pay | Admitting: Internal Medicine

## 2020-05-07 NOTE — Assessment & Plan Note (Signed)
-  Td 2014  - PNM shot - 2015;  prevnar-2015 - zostavax - 06-2016 - had covid moderna vax x 2, rec booster w/ moderna (per CDC) but ok pfizer  - flu shot today  - PSAs per urology  CCS: reports a Cscope at age 64, normal per patient; s/p cscope 06/2017 , next per GI  -Diet improved;  Exercise, bikes regulalrly Labs: Reviewed, will check a CMP and A1c.

## 2020-05-07 NOTE — Assessment & Plan Note (Signed)
Here for CPX DM with neuropathy: He started to eat healthier, CBGs dropped to the 50s, has decreased his insulin to 50 units daily, CBG this morning 97.  Also taking Jardiance, Metformin, Ozempic.  Checking A1c.  Feet care discussed.  Request a referral to Dr. Kelton Pillar, previously seen at [facility] but that is far from his house. HTN: Well-controlled, Check a CMP, continue Lotrel and metoprolol. Thyromegaly?  See physical exam, check a [place] RTC 3 months

## 2020-05-08 ENCOUNTER — Ambulatory Visit (HOSPITAL_BASED_OUTPATIENT_CLINIC_OR_DEPARTMENT_OTHER)
Admission: RE | Admit: 2020-05-08 | Discharge: 2020-05-08 | Disposition: A | Discharge status: Home / Self Care | Payer: BC Managed Care – PPO | Source: Ambulatory Visit | Attending: Internal Medicine | Admitting: Internal Medicine

## 2020-05-08 ENCOUNTER — Other Ambulatory Visit: Payer: Self-pay

## 2020-05-08 DIAGNOSIS — E010 Iodine-deficiency related diffuse (endemic) goiter: Secondary | ICD-10-CM | POA: Insufficient documentation

## 2020-05-08 NOTE — Addendum Note (Signed)
Addended byDamita Dunnings D on: 05/08/2020 01:00 PM   Modules accepted: Orders

## 2020-05-09 ENCOUNTER — Encounter: Payer: Self-pay | Admitting: Internal Medicine

## 2020-05-20 ENCOUNTER — Ambulatory Visit
Admission: RE | Admit: 2020-05-20 | Discharge: 2020-05-20 | Disposition: A | Discharge status: Home / Self Care | Payer: BC Managed Care – PPO | Source: Ambulatory Visit | Attending: Internal Medicine | Admitting: Internal Medicine

## 2020-05-20 ENCOUNTER — Other Ambulatory Visit (HOSPITAL_COMMUNITY)
Admission: RE | Admit: 2020-05-20 | Discharge: 2020-05-20 | Disposition: A | Discharge status: Home / Self Care | Payer: BC Managed Care – PPO | Source: Ambulatory Visit | Attending: Radiology | Admitting: Radiology

## 2020-05-20 DIAGNOSIS — E041 Nontoxic single thyroid nodule: Secondary | ICD-10-CM | POA: Diagnosis present

## 2020-05-23 LAB — CYTOLOGY - NON PAP

## 2020-05-26 ENCOUNTER — Other Ambulatory Visit: Payer: Self-pay | Admitting: *Deleted

## 2020-05-26 ENCOUNTER — Encounter: Payer: Self-pay | Admitting: Internal Medicine

## 2020-05-26 ENCOUNTER — Telehealth: Payer: Self-pay | Admitting: *Deleted

## 2020-05-26 DIAGNOSIS — C73 Malignant neoplasm of thyroid gland: Secondary | ICD-10-CM

## 2020-05-26 NOTE — Telephone Encounter (Signed)
Referral placed as urgent

## 2020-05-26 NOTE — Telephone Encounter (Signed)
-----   Message from Colon Branch, MD sent at 05/26/2020 11:20 AM EST ----- Regarding: Referral [place]: Please enter a referral to ENT, DX thyroid cancer, Dr. Asencion Partridge: Likes to see Dr. Wilburn Cornelia, ASAP, could he be seen this week.  Please let me know

## 2020-05-26 NOTE — Telephone Encounter (Signed)
[place], Timmothy Sours, MD; Doylene Canning, CMA Referral sent to Dr.Shoemaker.  They confirmed patient already had an appt for 06/04/20 ( [facility])

## 2020-05-27 ENCOUNTER — Encounter: Payer: Self-pay | Admitting: Internal Medicine

## 2020-05-27 DIAGNOSIS — C73 Malignant neoplasm of thyroid gland: Secondary | ICD-10-CM

## 2020-05-29 ENCOUNTER — Other Ambulatory Visit: Payer: Self-pay | Admitting: Family Medicine

## 2020-05-30 ENCOUNTER — Other Ambulatory Visit: Payer: Self-pay

## 2020-05-30 DIAGNOSIS — I10 Essential (primary) hypertension: Secondary | ICD-10-CM

## 2020-05-30 MED ORDER — ATORVASTATIN CALCIUM 20 MG PO TABS
20.0000 mg | ORAL_TABLET | Freq: Every day | ORAL | 1 refills | Status: DC
Start: 2020-05-30 — End: 2020-11-18

## 2020-05-30 MED ORDER — AMLODIPINE BESY-BENAZEPRIL HCL 10-40 MG PO CAPS
1.0000 | ORAL_CAPSULE | Freq: Every day | ORAL | 1 refills | Status: DC
Start: 2020-05-30 — End: 2020-11-18

## 2020-05-30 MED ORDER — METOPROLOL SUCCINATE ER 50 MG PO TB24: ORAL_TABLET | ORAL | 1 refills | Status: DC

## 2020-05-30 NOTE — Telephone Encounter (Signed)
Done. RX sent in for 6 months.

## 2020-06-10 ENCOUNTER — Encounter: Payer: Self-pay | Admitting: Internal Medicine

## 2020-06-10 ENCOUNTER — Other Ambulatory Visit: Payer: Self-pay

## 2020-06-10 ENCOUNTER — Ambulatory Visit (INDEPENDENT_AMBULATORY_CARE_PROVIDER_SITE_OTHER): Payer: BC Managed Care – PPO | Admitting: Internal Medicine

## 2020-06-10 VITALS — BP 146/92 | HR 88 | Ht 72.0 in | Wt 256.0 lb

## 2020-06-10 DIAGNOSIS — Z794 Long term (current) use of insulin: Secondary | ICD-10-CM | POA: Diagnosis not present

## 2020-06-10 DIAGNOSIS — E119 Type 2 diabetes mellitus without complications: Secondary | ICD-10-CM

## 2020-06-10 DIAGNOSIS — C73 Malignant neoplasm of thyroid gland: Secondary | ICD-10-CM | POA: Diagnosis not present

## 2020-06-10 DIAGNOSIS — E1142 Type 2 diabetes mellitus with diabetic polyneuropathy: Secondary | ICD-10-CM | POA: Diagnosis not present

## 2020-06-10 LAB — GLUCOSE, POCT (MANUAL RESULT ENTRY): POC Glucose: 139 mg/dl — AB (ref 70–99)

## 2020-06-10 MED ORDER — DEXCOM G6 SENSOR MISC: 1.0000 | 11 refills | Status: DC

## 2020-06-10 MED ORDER — DEXCOM G6 TRANSMITTER MISC
1.0000 | 3 refills | Status: DC
Start: 2020-06-10 — End: 2021-12-21

## 2020-06-10 MED ORDER — DEXCOM G6 RECEIVER DEVI
1.0000 | 0 refills | Status: DC
Start: 2020-06-10 — End: 2021-12-21

## 2020-06-10 NOTE — Patient Instructions (Signed)
-   Decrease tresiba to 50 units daily  - Continue Metformin 1000 mg twice daily  - Continue Jardiance 25 mg , 1 tablet daily with breakfast  - Continue Ozempic 1 mg daily       HOW TO TREAT LOW BLOOD SUGARS (Blood sugar LESS THAN 70 MG/DL)  Please follow the RULE OF 15 for the treatment of hypoglycemia treatment (when your (blood sugars are less than 70 mg/dL)    STEP 1: Take 15 grams of carbohydrates when your blood sugar is low, which includes:   3-4 GLUCOSE TABS  OR  3-4 OZ OF JUICE OR REGULAR SODA OR  ONE TUBE OF GLUCOSE GEL     STEP 2: RECHECK blood sugar in 15 MINUTES STEP 3: If your blood sugar is still low at the 15 minute recheck --> then, go back to STEP 1 and treat AGAIN with another 15 grams of carbohydrates.
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
## 2020-06-12 ENCOUNTER — Telehealth: Payer: Self-pay | Admitting: Cardiology

## 2020-06-12 ENCOUNTER — Telehealth: Payer: Self-pay

## 2020-06-12 NOTE — Telephone Encounter (Signed)
   Primary Cardiologist: Minus Breeding, MD  Chart reviewed as part of pre-operative protocol coverage. Given past medical history and time since last visit, based on ACC/AHA guidelines, Leonard [name] would be at acceptable risk for the planned procedure without further cardiovascular testing.   I will route this recommendation to the requesting party via Epic fax function and remove from pre-op pool.  Please call with questions.  Jossie Ng. Zaniya Mcaulay NP-C    06/12/2020, 1:35 PM [facility] [address] Office [phone] Fax [phone]

## 2020-06-12 NOTE — Telephone Encounter (Signed)
New message:      Beau Fanny calling from Dr. Cornelia Copa at [facility] need a copy of patient last stress test fax to [phone] attention Dawna Part. Patient need clearance as well.

## 2020-06-12 NOTE — Telephone Encounter (Signed)
Forwarded to requesting providers office via EPIC fax function 

## 2020-06-12 NOTE — Telephone Encounter (Signed)
   [facility] Pre-operative Risk Assessment    Request for surgical clearance:  1. What type of surgery is being performed?  LOBECTOMY TOTAL THYROID  2. When is this surgery scheduled? 06-18-2020   3. What type of clearance is required (medical clearance vs. Pharmacy clearance to hold med vs. Both)? MEDICAL  4. Are there any medications that need to be held prior to surgery and how long? NONE   5. Practice name and name of physician performing surgery? [facility]: MARY ANN, NP  6. What is the office phone number? [phone]   7.   What is the office fax number? [phone]  8.   Anesthesia type (None, local, MAC, general) ? NOT LISTED-LM2CB

## 2020-06-12 NOTE — Telephone Encounter (Signed)
   Tameka called back, she verified they received clearance but Audrea Muscat would like to include on the clearance, even though pt has abnormal stress test, pt is still clear for surgery.e-fax: [phone] or [phone] ATTN: to Audrea Muscat, NP

## 2020-06-13 NOTE — Telephone Encounter (Signed)
Leonard [name] from [place] calling to find out whether the patient's stress test was abnormal or not.

## 2020-06-13 NOTE — Telephone Encounter (Signed)
I spoke with Audrea Muscat of [facility] directly, this has been addressed.

## 2020-06-13 NOTE — Telephone Encounter (Signed)
Patient called and needs to make sure the clearance request gets to [facility] by 3:00 pm today otherwise they will cancel his surgery

## 2020-06-13 NOTE — Telephone Encounter (Signed)
The patient was cleared for surgery despite recent equivocal treadmill stress test. Per Dr. Rosezella [place] note: "I personally reviewed the result and there are no high risk findings. He had a very good exercise tolerance. He had no chest pain. He has had no recent symptoms. This test was done to screen for high risk findings in a patient with coronary calcium. Based on this there is no further indication for testing but he needs aggressive risk reduction."

## 2020-06-13 NOTE — Telephone Encounter (Signed)
Left message for [place] nurse navigator w/[facility] that copy of stress test will be faxed. Asked that she fax a clearance request to [phone]

## 2020-06-18 HISTORY — PX: TOTAL THYROIDECTOMY: SHX2547

## 2020-07-24 ENCOUNTER — Encounter: Payer: Self-pay | Admitting: Internal Medicine

## 2020-07-24 LAB — HM DIABETES EYE EXAM

## 2020-08-05 ENCOUNTER — Ambulatory Visit: Payer: BC Managed Care – PPO | Admitting: Internal Medicine

## 2020-08-06 ENCOUNTER — Encounter: Payer: Self-pay | Admitting: Internal Medicine

## 2020-08-06 ENCOUNTER — Ambulatory Visit: Payer: BC Managed Care – PPO | Admitting: Internal Medicine

## 2020-08-06 ENCOUNTER — Other Ambulatory Visit: Payer: Self-pay

## 2020-08-06 VITALS — BP 155/84 | HR 75 | Temp 97.7°F | Resp 18 | Ht 72.0 in | Wt 252.4 lb

## 2020-08-06 DIAGNOSIS — C73 Malignant neoplasm of thyroid gland: Secondary | ICD-10-CM

## 2020-08-06 DIAGNOSIS — F419 Anxiety disorder, unspecified: Secondary | ICD-10-CM | POA: Diagnosis not present

## 2020-08-06 DIAGNOSIS — F988 Other specified behavioral and emotional disorders with onset usually occurring in childhood and adolescence: Secondary | ICD-10-CM | POA: Insufficient documentation

## 2020-08-06 DIAGNOSIS — I10 Essential (primary) hypertension: Secondary | ICD-10-CM

## 2020-08-06 DIAGNOSIS — E782 Mixed hyperlipidemia: Secondary | ICD-10-CM

## 2020-08-06 LAB — LIPID PANEL
Cholesterol: 135 mg/dL (ref 0–200)
HDL: 35.2 mg/dL — ABNORMAL LOW (ref >39.00)
NonHDL: 99.41
Total CHOL/HDL Ratio: 4
Triglycerides: 249 mg/dL — ABNORMAL HIGH (ref 0.0–149.0)
VLDL: 49.8 mg/dL — ABNORMAL HIGH (ref 0.0–40.0)

## 2020-08-06 LAB — BASIC METABOLIC PANEL
BUN: 24 mg/dL — ABNORMAL HIGH (ref 6–23)
CO2: 25 mEq/L (ref 19–32)
Calcium: 9.3 mg/dL (ref 8.4–10.5)
Chloride: 103 mEq/L (ref 96–112)
Creatinine, Ser: 1.06 mg/dL (ref 0.40–1.50)
GFR: 74.18 mL/min (ref >60.00)
Glucose, Bld: 114 mg/dL — ABNORMAL HIGH (ref 70–99)
Potassium: 4 mEq/L (ref 3.5–5.1)
Sodium: 138 mEq/L (ref 135–145)

## 2020-08-06 LAB — LDL CHOLESTEROL, DIRECT: Direct LDL: 56 mg/dL

## 2020-08-06 MED ORDER — METOPROLOL SUCCINATE ER 100 MG PO TB24: 100.0000 mg | ORAL_TABLET | Freq: Every day | ORAL | 1 refills | Status: DC
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
## 2020-08-06 NOTE — Patient Instructions (Addendum)
Increase metoprolol XL to 100 mg: 1 tablet daily. Check the  blood pressure 2 or 3 times a week BP GOAL is between 110/65 and  135/85. If it is consistently higher or lower, let me know Heart rate should be around 60   GO TO THE LAB : Get the blood work   [place], [place] back for   a checkup in 4 months   Advance Directive  Advance directives are legal documents that allow you to make decisions about your health care and medical treatment in case you become unable to communicate for yourself. Advance directives let your wishes be known to family, friends, and health care providers. Discussing and writing advance directives should happen over time rather than all at once. Advance directives can be changed and updated at any time. There are different types of advance directives, such as:  Medical power of attorney.  Living will.  Do not resuscitate (DNR) order or do not attempt resuscitation (DNAR) order. Health care proxy and medical power of attorney A health care proxy is also called a health care agent. This person is appointed to make medical decisions for you when you are unable to make decisions for yourself. Generally, people ask a trusted friend or family member to act as their proxy and represent their preferences. Make sure you have an agreement with your trusted person to act as your proxy. A proxy may have to make a medical decision on your behalf if your wishes are not known. A medical power of attorney, also called a durable power of attorney for health care, is a legal document that names your health care proxy. Depending on the laws in your state, the document may need to be:  Signed.  Notarized.  Dated.  Copied.  Witnessed.  Incorporated into your medical record. You may also want to appoint a trusted person to manage your money in the event you are unable to do so. This is called a durable power of attorney for  finances. It is a separate legal document from the durable power of attorney for health care. You may choose your health care proxy or someone different to act as your agent in money matters. If you do not appoint a proxy, or there is a concern that the proxy is not acting in your best interest, a court may appoint a guardian to act on your behalf. Living will A living will is a set of instructions that state your wishes about medical care when you cannot express them yourself. Health care providers should keep a copy of your living will in your medical record. You may want to give a copy to family members or friends. To alert caregivers in case of an emergency, you can place a card in your wallet to let them know that you have a living will and where they can find it. A living will is used if you become:  Terminally ill.  Disabled.  Unable to communicate or make decisions. The following decisions should be included in your living will:  To use or not to use life support equipment, such as dialysis machines and breathing machines (ventilators).  Whether you want a DNR or DNAR order. This tells health care providers not to use cardiopulmonary resuscitation (CPR) if breathing or heartbeat stops.  To use or not to use tube feeding.  To be given or not to be given food and fluids.  Whether you want comfort (palliative)  care when the goal becomes comfort rather than a cure.  Whether you want to donate your organs and tissues. A living will does not give instructions for distributing your money and property if you should pass away. DNR or DNAR A DNR or DNAR order is a request not to have CPR in the event that your heart stops beating or you stop breathing. If a DNR or DNAR order has not been made and shared, a health care provider will try to help any patient whose heart has stopped or who has stopped breathing. If you plan to have surgery, talk with your health care provider about how your DNR or  DNAR order will be followed if problems occur. What if I do not have an advance directive? Some states assign family decision makers to act on your behalf if you do not have an advance directive. Each state has its own laws about advance directives. You may want to check with your health care provider, attorney, or state representative about the laws in your state. Summary  Advance directives are legal documents that allow you to make decisions about your health care and medical treatment in case you become unable to communicate for yourself.  The process of discussing and writing advance directives should happen over time. You can change and update advance directives at any time.  Advance directives may include a medical power of attorney, a living will, and a DNR or DNAR order. This information is not intended to replace advice given to you by your health care provider. Make sure you discuss any questions you have with your health care provider. Document Revised: 03/18/2020 Document Reviewed: 03/18/2020 Elsevier Patient Education  2021 [employer].

## 2020-08-06 NOTE — Assessment & Plan Note (Signed)
DM: Per Endo HTN: Ambulatory BPs mostly in the 140/80, currently on Lotrel 10-40, HCTZ, metoprolol XL 50 mg daily (heart rate 75). In the past bringing BP lower than 140/80 has been difficult. Plan: BMP, increase metoprolol to 100 mg daily, watch for excessive BP control and bradycardia.  Monitor BPs.  See AVS Hyperlipidemia: On atorvastatin, last LDL controlled  at 54, recheck FLP Thyroid cancer: Had a total thyroidectomy in 2 stages, + mets on two lymph nodes, to take radioactive iodine per pt. Anxiety, depression, ADD, OCD: Managed by psychiatry, on multiple medications, still very anxious but hopes it would improve with recent changes on medication. CV: Chart reviewed, coronary Ca+ per abdominal CT, saw cards, ETT low risk 04-2020, Rx CV RF control RTC 4 months

## 2020-08-06 NOTE — Progress Notes (Signed)
Pre visit review using our clinic review tool, if applicable. No additional management support is needed unless otherwise documented below in the visit note. 

## 2020-10-31 LAB — HEMOGLOBIN A1C: Hemoglobin A1C: 6

## 2020-11-18 ENCOUNTER — Other Ambulatory Visit: Payer: Self-pay | Admitting: Internal Medicine

## 2020-12-04 ENCOUNTER — Ambulatory Visit: Payer: BC Managed Care – PPO | Admitting: Internal Medicine

## 2020-12-05 ENCOUNTER — Encounter: Payer: Self-pay | Admitting: Internal Medicine

## 2020-12-08 ENCOUNTER — Encounter: Payer: Self-pay | Admitting: Internal Medicine

## 2020-12-08 ENCOUNTER — Ambulatory Visit (INDEPENDENT_AMBULATORY_CARE_PROVIDER_SITE_OTHER): Payer: BC Managed Care – PPO | Admitting: Internal Medicine

## 2020-12-08 ENCOUNTER — Other Ambulatory Visit: Payer: Self-pay

## 2020-12-08 ENCOUNTER — Ambulatory Visit (HOSPITAL_BASED_OUTPATIENT_CLINIC_OR_DEPARTMENT_OTHER)
Admission: RE | Admit: 2020-12-08 | Discharge: 2020-12-08 | Disposition: A | Discharge status: Home / Self Care | Payer: BC Managed Care – PPO | Source: Ambulatory Visit | Attending: Internal Medicine | Admitting: Internal Medicine

## 2020-12-08 VITALS — BP 136/78 | HR 69 | Temp 98.2°F | Resp 16 | Ht 72.0 in | Wt 249.2 lb

## 2020-12-08 DIAGNOSIS — E782 Mixed hyperlipidemia: Secondary | ICD-10-CM | POA: Diagnosis not present

## 2020-12-08 DIAGNOSIS — I10 Essential (primary) hypertension: Secondary | ICD-10-CM | POA: Diagnosis not present

## 2020-12-08 DIAGNOSIS — M533 Sacrococcygeal disorders, not elsewhere classified: Secondary | ICD-10-CM | POA: Insufficient documentation

## 2020-12-08 DIAGNOSIS — F429 Obsessive-compulsive disorder, unspecified: Secondary | ICD-10-CM

## 2020-12-08 DIAGNOSIS — F419 Anxiety disorder, unspecified: Secondary | ICD-10-CM

## 2020-12-08 LAB — CBC WITH DIFFERENTIAL/PLATELET
Basophils Absolute: 0.1 10*3/uL (ref 0.0–0.1)
Basophils Relative: 0.9 % (ref 0.0–3.0)
Eosinophils Absolute: 0.5 10*3/uL (ref 0.0–0.7)
Eosinophils Relative: 7.5 % — ABNORMAL HIGH (ref 0.0–5.0)
HCT: 43.6 % (ref 39.0–52.0)
Hemoglobin: 14.9 g/dL (ref 13.0–17.0)
Lymphocytes Relative: 20.9 % (ref 12.0–46.0)
Lymphs Abs: 1.4 10*3/uL (ref 0.7–4.0)
MCHC: 34.2 g/dL (ref 30.0–36.0)
MCV: 90.7 fl (ref 78.0–100.0)
Monocytes Absolute: 0.7 10*3/uL (ref 0.1–1.0)
Monocytes Relative: 10.4 % (ref 3.0–12.0)
Neutro Abs: 3.9 10*3/uL (ref 1.4–7.7)
Neutrophils Relative %: 60.3 % (ref 43.0–77.0)
Platelets: 268 10*3/uL (ref 150.0–400.0)
RBC: 4.8 Mil/uL (ref 4.22–5.81)
RDW: 14 % (ref 11.5–15.5)
WBC: 6.5 10*3/uL (ref 4.0–10.5)

## 2020-12-08 LAB — AST: AST: 27 U/L (ref 0–37)

## 2020-12-08 LAB — ALT: ALT: 38 U/L (ref 0–53)

## 2020-12-08 NOTE — Patient Instructions (Signed)
Check the  blood pressure regulalrly BP GOAL is between 110/65 and  135/85. If it is consistently higher or lower, let me know     GO TO THE LAB : Get the blood work     [place], [place] back for a physical exam by 04-2021   STOP BY THE FIRST FLOOR:  get the XR
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
## 2020-12-09 DIAGNOSIS — F429 Obsessive-compulsive disorder, unspecified: Secondary | ICD-10-CM | POA: Insufficient documentation

## 2020-12-09 NOTE — Assessment & Plan Note (Signed)
DM: Per Endo, last A1c 6.0. HTN: Ambulatory BPs 140/80, Endo did some blood work to work-up for secondary hypertension etiology, renin/aldosterone/potassium were normal recently. Continue HCTZ, metoprolol. Hyperlipidemia: Well-controlled on atorvastatin Anxiety depression, ADD, OCD: Still an issue, recently meds were changed by psych. Coccyx pain: As described above, to be sure we will check x-ray, he is already using donut pillow Preventive care: Had COVID vaccines x4 RTC 04-2021 CPX

## 2021-02-12 ENCOUNTER — Other Ambulatory Visit: Payer: Self-pay | Admitting: Internal Medicine

## 2021-04-17 LAB — HEMOGLOBIN A1C: Hemoglobin A1C: 6.6

## 2021-04-17 LAB — HM DIABETES FOOT EXAM

## 2021-05-19 ENCOUNTER — Encounter: Payer: Self-pay | Admitting: Internal Medicine

## 2021-05-20 ENCOUNTER — Other Ambulatory Visit: Payer: Self-pay

## 2021-05-20 ENCOUNTER — Encounter: Payer: Self-pay | Admitting: Internal Medicine

## 2021-05-20 ENCOUNTER — Ambulatory Visit (INDEPENDENT_AMBULATORY_CARE_PROVIDER_SITE_OTHER): Payer: Medicare PPO | Admitting: Internal Medicine

## 2021-05-20 VITALS — BP 142/76 | HR 77 | Temp 98.1°F | Resp 16 | Ht 72.0 in | Wt 251.5 lb

## 2021-05-20 DIAGNOSIS — Z0000 Encounter for general adult medical examination without abnormal findings: Secondary | ICD-10-CM | POA: Diagnosis not present

## 2021-05-20 DIAGNOSIS — Z23 Encounter for immunization: Secondary | ICD-10-CM | POA: Diagnosis not present

## 2021-05-20 NOTE — Patient Instructions (Signed)
Vaccines I recommend COVID booster next week Consider Shingrix at some point  Continue working with your endocrinology regards BPs.  GO TO THE LAB : Get the blood work     Leonard [name], [place] back for a checkup in 6 to 8 months
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
## 2021-05-22 ENCOUNTER — Encounter: Payer: Self-pay | Admitting: Internal Medicine

## 2021-05-22 NOTE — Assessment & Plan Note (Signed)
Assessment DM Neuropathy (Likely diabetic although symptoms started with decrease sensitivity in both plantar areas after knee surgeries) HTN Hyperlipidemia CV: Coronary calcifications per abd CT, saw cards, ETT low risk 04-2020, Rx CV RF control Anxiety, depression, ADD (dx 2017) , OCD-Dr Toy Care OSA on CPAP BPH, nephrolithiasis -  Dr Amalia Hailey on allopurinol-K+citrate-HCTZ DJD, multiple locations Thyroid cancer,s/p surgery total thyroidectomy 05/2020 (had 2 surgeries:  R thyroid first, then L), + mets on 2  LADs  PLAN Here for CPX DM: Per Endo HTN: Endocrinology ruling out adrenal disease, they have modified his BP meds: - Holding metoprolol and HCTZ -On lotrel , added hydralazine  History of thyroid cancer: Follow-up by Endo Hyperlipidemia: On atorvastatin, labs. Coronary calcifications: On aspirin Anxiety, depression, ADD, OCD: Symptoms not well controlled, under care of psychiatry, patient tried to get a second opinion. Thyroid cancer, history of: Per Endo, question of R sided neck lump,  Endo already aware and plan further work-up (see physical exam) RTC 6 to 8 months

## 2021-05-22 NOTE — Assessment & Plan Note (Signed)
-  Td 2014  - PNM shot: 2015;  prevnar: 2015; PNM 20: 04-2021 - zostavax - 06-2016 - shingrix d/w pt -COVID booster recommended - flu shot today  - PSAs per urology  -CCS: reports a Cscope at age 65, normal per patient; s/p cscope 06/2017 , next 2024 per GI letter -Diet: overeating again d/t anxiety. Exercise, bikes regulalrly -Labs: Labs reviewed: CMP, FLP, CBC  -Advance care planning package provided

## 2021-06-08 ENCOUNTER — Other Ambulatory Visit: Payer: Self-pay | Admitting: Internal Medicine

## 2021-06-30 DIAGNOSIS — C73 Malignant neoplasm of thyroid gland: Secondary | ICD-10-CM | POA: Diagnosis not present

## 2021-06-30 DIAGNOSIS — Z85850 Personal history of malignant neoplasm of thyroid: Secondary | ICD-10-CM | POA: Diagnosis not present

## 2021-07-07 DIAGNOSIS — C73 Malignant neoplasm of thyroid gland: Secondary | ICD-10-CM | POA: Diagnosis not present

## 2021-07-07 DIAGNOSIS — Z794 Long term (current) use of insulin: Secondary | ICD-10-CM | POA: Diagnosis not present

## 2021-07-07 DIAGNOSIS — I10 Essential (primary) hypertension: Secondary | ICD-10-CM | POA: Diagnosis not present

## 2021-07-07 DIAGNOSIS — E1121 Type 2 diabetes mellitus with diabetic nephropathy: Secondary | ICD-10-CM | POA: Diagnosis not present

## 2021-07-07 DIAGNOSIS — N200 Calculus of kidney: Secondary | ICD-10-CM | POA: Diagnosis not present

## 2021-07-07 DIAGNOSIS — E785 Hyperlipidemia, unspecified: Secondary | ICD-10-CM | POA: Diagnosis not present

## 2021-07-07 DIAGNOSIS — E1165 Type 2 diabetes mellitus with hyperglycemia: Secondary | ICD-10-CM | POA: Diagnosis not present

## 2021-07-07 DIAGNOSIS — Z6835 Body mass index (BMI) 35.0-35.9, adult: Secondary | ICD-10-CM | POA: Diagnosis not present

## 2021-07-07 DIAGNOSIS — E1142 Type 2 diabetes mellitus with diabetic polyneuropathy: Secondary | ICD-10-CM | POA: Diagnosis not present

## 2021-07-07 DIAGNOSIS — F419 Anxiety disorder, unspecified: Secondary | ICD-10-CM | POA: Diagnosis not present

## 2021-07-07 DIAGNOSIS — N400 Enlarged prostate without lower urinary tract symptoms: Secondary | ICD-10-CM | POA: Diagnosis not present

## 2021-07-21 DIAGNOSIS — I2510 Atherosclerotic heart disease of native coronary artery without angina pectoris: Secondary | ICD-10-CM | POA: Insufficient documentation

## 2021-07-21 DIAGNOSIS — E785 Hyperlipidemia, unspecified: Secondary | ICD-10-CM | POA: Insufficient documentation
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
## 2021-07-22 ENCOUNTER — Ambulatory Visit: Payer: Medicare PPO | Admitting: Cardiology

## 2021-07-28 DIAGNOSIS — F419 Anxiety disorder, unspecified: Secondary | ICD-10-CM | POA: Diagnosis not present

## 2021-07-28 DIAGNOSIS — Z6835 Body mass index (BMI) 35.0-35.9, adult: Secondary | ICD-10-CM | POA: Diagnosis not present

## 2021-08-04 DIAGNOSIS — H5203 Hypermetropia, bilateral: Secondary | ICD-10-CM | POA: Diagnosis not present

## 2021-08-04 DIAGNOSIS — E119 Type 2 diabetes mellitus without complications: Secondary | ICD-10-CM | POA: Diagnosis not present

## 2021-08-04 LAB — HM DIABETES EYE EXAM

## 2021-08-05 ENCOUNTER — Encounter: Payer: Self-pay | Admitting: Internal Medicine
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
## 2021-09-02 ENCOUNTER — Other Ambulatory Visit: Payer: Self-pay

## 2021-09-02 ENCOUNTER — Ambulatory Visit (INDEPENDENT_AMBULATORY_CARE_PROVIDER_SITE_OTHER): Payer: Medicare PPO | Admitting: Cardiology

## 2021-09-02 ENCOUNTER — Encounter: Payer: Self-pay | Admitting: Cardiology

## 2021-09-02 VITALS — BP 140/79 | HR 73 | Ht 72.0 in | Wt 258.2 lb

## 2021-09-02 DIAGNOSIS — E785 Hyperlipidemia, unspecified: Secondary | ICD-10-CM | POA: Diagnosis not present

## 2021-09-02 DIAGNOSIS — R931 Abnormal findings on diagnostic imaging of heart and coronary circulation: Secondary | ICD-10-CM

## 2021-09-02 DIAGNOSIS — I10 Essential (primary) hypertension: Secondary | ICD-10-CM | POA: Diagnosis not present

## 2021-09-02 DIAGNOSIS — E118 Type 2 diabetes mellitus with unspecified complications: Secondary | ICD-10-CM | POA: Diagnosis not present

## 2021-09-02 MED ORDER — METOPROLOL TARTRATE 50 MG PO TABS: 100.0000 mg | ORAL_TABLET | Freq: Once | ORAL | 0 refills | Status: DC

## 2021-09-02 NOTE — Patient Instructions (Addendum)
Medication Instructions:  ? ?Metoprolol tartrate 100 mg  one time dose only ? ? ?*If you need a refill on your cardiac medications before your next appointment, please call your pharmacy* ? ? ?Lab Work: ?BMP  see instruction below ? ?If you have labs (blood work) drawn today and your tests are completely normal, you will receive your results only by: ?MyChart Message (if you have MyChart) OR ?A paper copy in the mail ?If you have any lab test that is abnormal or we need to change your treatment, we will call you to review the results. ? ? ?Testing/Procedures: ?Will be schedule at radiology dept at [facility] ?Cardiac coronary  CT Angiography (CTA), is a special type of CT scan that uses a computer to produce multi-dimensional views of major blood vessels throughout the heart. In CT angiography, a contrast material is injected through an IV to help visualize the blood vessels  ? ? ?Follow-Up: ?At [facility], you and your health needs are our priority.  As part of our continuing mission to provide you with exceptional heart care, we have created designated Provider Care Teams.  These Care Teams include your primary Cardiologist (physician) and Advanced Practice Providers (APPs -  Physician Assistants and Nurse Practitioners) who all work together to provide you with the care you need, when you need it. ? ?  ? ?Your next appointment:   ?6 month(s) ? ?The format for your next appointment:   ?In Person ? ?Provider:   ?Minus Breeding, MD  ? ? ?Other Instructions  ? ? ?Your cardiac CT will be scheduled at the below location:  ? ?[facility] ?[address] ?[place], [place] [address] ?[phone] ? ? ? ?If scheduled at [facility], please arrive at the [facility] Entrance (Entrance C2) of [facility] 30 minutes prior to test start time. ?You can use the FREE valet parking offered at entrance C (encouraged to control the heart rate for the test)  ?Proceed to the [facility]  Radiology Department (first floor) to check-in and test prep. ? ?All radiology patients and guests should use entrance C2 at [facility], accessed from [facility], even though the hospital's physical address listed is [address]. ? ? ? ? ? ?Please follow these instructions carefully (unless otherwise directed): ? ?Hold all erectile dysfunction medications at least 3 days (72 hrs) prior to test. ? ? BMP at least one week prior to test ? ? ?On the Night Before the Test: ?Be sure to Drink plenty of water. ?Do not consume any caffeinated/decaffeinated beverages or chocolate 12 hours prior to your test. ?Do not take any antihistamines 12 hours prior to your test. ? ? ?On the Day of the Test: ?Drink plenty of water until 1 hour prior to the test. ?Do not eat any food 4 hours prior to the test. ?You may take your regular medications prior to the test.  ?Take metoprolol (Lopressor)  100 mg two hours prior to test. ?HOLD Hydrochlorothiazide morning of the test. ? ? ?After the Test: ?Drink plenty of water. ?After receiving IV contrast, you may experience a mild flushed feeling. This is normal. ?On occasion, you may experience a mild rash up to 24 hours after the test. This is not dangerous. If this occurs, you can take Benadryl 25 mg and increase your fluid intake. ?If you experience trouble breathing, this can be serious. If it is severe call 911 IMMEDIATELY. If it is mild,  please call our office. ?If you take any of these medications: Metformin,  please do not take 48 hours after completing test unless otherwise instructed. ? ?We will call to schedule your test 2-4 weeks out understanding that some insurance companies will need an authorization prior to the service being performed.  ? ?For non-scheduling related questions, please contact the cardiac imaging nurse navigator should you have any questions/concerns: ?Marchia Bond, Cardiac Imaging Nurse Navigator ?Gordy Clement, Cardiac Imaging  Nurse Navigator ?[facility] ?Direct Office Dial: [phone]  ? ?For scheduling needs, including cancellations and rescheduling, please call [place], [phone].  ?

## 2021-09-18 ENCOUNTER — Telehealth (HOSPITAL_COMMUNITY): Payer: Self-pay | Admitting: Emergency Medicine

## 2021-09-18 DIAGNOSIS — R931 Abnormal findings on diagnostic imaging of heart and coronary circulation: Secondary | ICD-10-CM | POA: Diagnosis not present

## 2021-09-18 DIAGNOSIS — E785 Hyperlipidemia, unspecified: Secondary | ICD-10-CM | POA: Diagnosis not present

## 2021-09-18 DIAGNOSIS — I10 Essential (primary) hypertension: Secondary | ICD-10-CM | POA: Diagnosis not present

## 2021-09-18 DIAGNOSIS — E118 Type 2 diabetes mellitus with unspecified complications: Secondary | ICD-10-CM | POA: Diagnosis not present

## 2021-09-18 LAB — BASIC METABOLIC PANEL
BUN/Creatinine Ratio: 25 — ABNORMAL HIGH (ref 10–24)
BUN: 25 mg/dL (ref 8–27)
CO2: 20 mmol/L (ref 20–29)
Calcium: 8.8 mg/dL (ref 8.6–10.2)
Chloride: 104 mmol/L (ref 96–106)
Creatinine, Ser: 1 mg/dL (ref 0.76–1.27)
Glucose: 123 mg/dL — ABNORMAL HIGH (ref 70–99)
Potassium: 4.3 mmol/L (ref 3.5–5.2)
Sodium: 137 mmol/L (ref 134–144)
eGFR: 84 mL/min/{1.73_m2} (ref >59)

## 2021-09-18 NOTE — Telephone Encounter (Signed)
Reaching out to patient to offer assistance regarding upcoming cardiac imaging study; pt verbalizes understanding of appt date/time, parking situation and where to check in, pre-test NPO status and medications ordered, and verified current allergies; name and call back number provided for further questions should they arise ?Marchia Bond RN Navigator Cardiac Imaging ?[facility] ?[phone] office ?[phone] cell ? ?Denies iv issues ?Getting labs today ?'100mg'$  metoprolol tartrate  ?Holding HCTZ ?Arrival 900 ?

## 2021-09-22 ENCOUNTER — Other Ambulatory Visit: Payer: Self-pay

## 2021-09-22 ENCOUNTER — Encounter (HOSPITAL_COMMUNITY): Payer: Self-pay

## 2021-09-22 ENCOUNTER — Ambulatory Visit (HOSPITAL_COMMUNITY)
Admission: RE | Admit: 2021-09-22 | Discharge: 2021-09-22 | Disposition: A | Discharge status: Home / Self Care | Payer: Medicare PPO | Source: Ambulatory Visit | Attending: Cardiology | Admitting: Cardiology

## 2021-09-22 ENCOUNTER — Other Ambulatory Visit: Payer: Self-pay | Admitting: Cardiology

## 2021-09-22 DIAGNOSIS — I10 Essential (primary) hypertension: Secondary | ICD-10-CM | POA: Diagnosis not present

## 2021-09-22 DIAGNOSIS — I2510 Atherosclerotic heart disease of native coronary artery without angina pectoris: Secondary | ICD-10-CM | POA: Diagnosis not present

## 2021-09-22 DIAGNOSIS — R931 Abnormal findings on diagnostic imaging of heart and coronary circulation: Secondary | ICD-10-CM | POA: Diagnosis not present

## 2021-09-22 DIAGNOSIS — E118 Type 2 diabetes mellitus with unspecified complications: Secondary | ICD-10-CM | POA: Diagnosis not present

## 2021-09-22 DIAGNOSIS — E785 Hyperlipidemia, unspecified: Secondary | ICD-10-CM | POA: Diagnosis not present

## 2021-09-22 MED ORDER — METOPROLOL TARTRATE 5 MG/5ML IV SOLN
10.0000 mg | Freq: Once | INTRAVENOUS | Status: AC
Start: 2021-09-22 — End: 2021-09-22
  Administered 2021-09-22: 10 mg via INTRAVENOUS

## 2021-09-22 MED ORDER — NITROGLYCERIN 0.4 MG SL SUBL
SUBLINGUAL_TABLET | SUBLINGUAL | Status: AC
  Filled 2021-09-22: qty 2

## 2021-09-22 MED ORDER — METOPROLOL TARTRATE 5 MG/5ML IV SOLN
INTRAVENOUS | Status: AC
  Filled 2021-09-22: qty 10

## 2021-09-22 MED ORDER — NITROGLYCERIN 0.4 MG SL SUBL
0.8000 mg | SUBLINGUAL_TABLET | Freq: Once | SUBLINGUAL | Status: AC
  Administered 2021-09-22: 0.8 mg via SUBLINGUAL

## 2021-09-22 MED ORDER — IOHEXOL 350 MG/ML SOLN
100.0000 mL | Freq: Once | INTRAVENOUS | Status: AC | PRN
  Administered 2021-09-22: 100 mL via INTRAVENOUS

## 2021-09-24 ENCOUNTER — Encounter: Payer: Self-pay | Admitting: *Deleted

## 2021-09-28 ENCOUNTER — Encounter: Payer: Self-pay | Admitting: *Deleted

## 2021-10-06 DIAGNOSIS — Z6835 Body mass index (BMI) 35.0-35.9, adult: Secondary | ICD-10-CM | POA: Diagnosis not present

## 2021-10-06 DIAGNOSIS — C73 Malignant neoplasm of thyroid gland: Secondary | ICD-10-CM | POA: Diagnosis not present

## 2021-10-06 DIAGNOSIS — E1165 Type 2 diabetes mellitus with hyperglycemia: Secondary | ICD-10-CM | POA: Diagnosis not present
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
## 2021-10-22 ENCOUNTER — Ambulatory Visit: Payer: Medicare PPO | Admitting: Cardiology

## 2021-10-22 ENCOUNTER — Encounter: Payer: Self-pay | Admitting: Cardiology

## 2021-10-22 VITALS — BP 120/70 | HR 69 | Ht 72.0 in | Wt 259.8 lb

## 2021-10-22 DIAGNOSIS — I2510 Atherosclerotic heart disease of native coronary artery without angina pectoris: Secondary | ICD-10-CM

## 2021-10-22 DIAGNOSIS — I10 Essential (primary) hypertension: Secondary | ICD-10-CM | POA: Diagnosis not present

## 2021-10-22 DIAGNOSIS — E785 Hyperlipidemia, unspecified: Secondary | ICD-10-CM

## 2021-10-22 DIAGNOSIS — E118 Type 2 diabetes mellitus with unspecified complications: Secondary | ICD-10-CM

## 2021-10-22 NOTE — Patient Instructions (Signed)
Medication Instructions:  ?Your Physician recommend you continue on your current medication as directed.   ? ?*If you need a refill on your cardiac medications before your next appointment, please call your pharmacy* ? ? ?Lab Work: ?None ordered today ? ? ?Testing/Procedures: ?None ordered today ? ?Follow-Up: ?At [facility], you and your health needs are our priority.  As part of our continuing mission to provide you with exceptional heart care, we have created designated Provider Care Teams.  These Care Teams include your primary Cardiologist (physician) and Advanced Practice Providers (APPs -  Physician Assistants and Nurse Practitioners) who all work together to provide you with the care you need, when you need it. ? ?We recommend signing up for the patient portal called "MyChart".  Sign up information is provided on this After Visit Summary.  MyChart is used to connect with patients for Virtual Visits (Telemedicine).  Patients are able to view lab/test results, encounter notes, upcoming appointments, etc.  Non-urgent messages can be sent to your provider as well.   ?To learn more about what you can do with MyChart, go to [email or web address].   ? ?Your next appointment:   ?1 year(s) ? ?The format for your next appointment:   ?In Person ? ?Provider:   ?James Hochrein, MD { ? ?Important Information About Sugar ? ? ? ? ? ? ?

## 2021-11-18 DIAGNOSIS — M25561 Pain in right knee: Secondary | ICD-10-CM | POA: Diagnosis not present

## 2021-11-18 DIAGNOSIS — Z96653 Presence of artificial knee joint, bilateral: Secondary | ICD-10-CM | POA: Diagnosis not present

## 2021-12-04 DIAGNOSIS — E1165 Type 2 diabetes mellitus with hyperglycemia: Secondary | ICD-10-CM | POA: Diagnosis not present

## 2021-12-21 ENCOUNTER — Encounter: Payer: Self-pay | Admitting: Internal Medicine

## 2021-12-21 ENCOUNTER — Ambulatory Visit (INDEPENDENT_AMBULATORY_CARE_PROVIDER_SITE_OTHER): Payer: Medicare PPO | Admitting: Internal Medicine

## 2021-12-21 VITALS — BP 126/68 | HR 71 | Temp 97.7°F | Resp 18 | Ht 72.0 in | Wt 253.4 lb

## 2021-12-21 DIAGNOSIS — E782 Mixed hyperlipidemia: Secondary | ICD-10-CM

## 2021-12-21 DIAGNOSIS — F429 Obsessive-compulsive disorder, unspecified: Secondary | ICD-10-CM

## 2021-12-21 DIAGNOSIS — L989 Disorder of the skin and subcutaneous tissue, unspecified: Secondary | ICD-10-CM | POA: Diagnosis not present

## 2021-12-21 DIAGNOSIS — I10 Essential (primary) hypertension: Secondary | ICD-10-CM | POA: Diagnosis not present

## 2021-12-21 MED ORDER — SHINGRIX 50 MCG/0.5ML IM SUSR: 0.5000 mL | Freq: Once | INTRAMUSCULAR | 1 refills | Status: AC

## 2022-01-26 DIAGNOSIS — L570 Actinic keratosis: Secondary | ICD-10-CM | POA: Diagnosis not present

## 2022-01-26 DIAGNOSIS — L918 Other hypertrophic disorders of the skin: Secondary | ICD-10-CM | POA: Diagnosis not present

## 2022-01-26 DIAGNOSIS — L218 Other seborrheic dermatitis: Secondary | ICD-10-CM | POA: Diagnosis not present

## 2022-01-26 DIAGNOSIS — D225 Melanocytic nevi of trunk: Secondary | ICD-10-CM | POA: Diagnosis not present

## 2022-01-26 DIAGNOSIS — D1801 Hemangioma of skin and subcutaneous tissue: Secondary | ICD-10-CM | POA: Diagnosis not present

## 2022-02-08 ENCOUNTER — Encounter: Payer: Self-pay | Admitting: Internal Medicine

## 2022-02-08 ENCOUNTER — Other Ambulatory Visit (HOSPITAL_BASED_OUTPATIENT_CLINIC_OR_DEPARTMENT_OTHER): Payer: Self-pay

## 2022-02-08 ENCOUNTER — Ambulatory Visit: Payer: Medicare PPO | Admitting: Internal Medicine

## 2022-02-08 VITALS — BP 122/80 | HR 70 | Temp 98.2°F | Resp 16 | Ht 72.0 in | Wt 250.4 lb

## 2022-02-08 DIAGNOSIS — F419 Anxiety disorder, unspecified: Secondary | ICD-10-CM | POA: Diagnosis not present

## 2022-02-08 DIAGNOSIS — F429 Obsessive-compulsive disorder, unspecified: Secondary | ICD-10-CM | POA: Diagnosis not present

## 2022-02-08 DIAGNOSIS — M779 Enthesopathy, unspecified: Secondary | ICD-10-CM | POA: Diagnosis not present

## 2022-02-08 MED ORDER — SHINGRIX 50 MCG/0.5ML IM SUSR
INTRAMUSCULAR | 1 refills | Status: DC
  Filled 2022-02-08: qty 1, 1d supply, fill #0

## 2022-02-08 NOTE — Assessment & Plan Note (Signed)
L deltoid tendinitis: Suspect tendinitis, recommend stretching, icing, topical Voltaren.  If not better he will let me know. Anxiety, depression, OCD, ADD: Continue with anxiety, to see a counselor soon.  Denies suicidal ideas Frequent falls: Related to poor ankle mobility, states he uses a cane, has already discussed issue with Ortho.  Still doing yard work, recommend extreme caution.  Verbalized understanding. RTC as scheduled for November.

## 2022-02-08 NOTE — Patient Instructions (Signed)
Apply Voltaren gel twice daily    Before doing yard work: stretch your shoulders, upper back.  Stretching general  Icing   Call if no better
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
## 2022-03-02 ENCOUNTER — Ambulatory Visit (INDEPENDENT_AMBULATORY_CARE_PROVIDER_SITE_OTHER): Payer: Medicare PPO | Admitting: Psychiatry

## 2022-03-02 DIAGNOSIS — F429 Obsessive-compulsive disorder, unspecified: Secondary | ICD-10-CM

## 2022-03-02 DIAGNOSIS — F5081 Binge eating disorder: Secondary | ICD-10-CM | POA: Diagnosis not present
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
## 2022-03-03 ENCOUNTER — Encounter: Payer: Self-pay | Admitting: Cardiology

## 2022-03-03 ENCOUNTER — Ambulatory Visit: Payer: Medicare PPO | Attending: Cardiology | Admitting: Cardiology

## 2022-03-03 VITALS — BP 126/72 | HR 74 | Ht 72.0 in | Wt 251.6 lb

## 2022-03-03 DIAGNOSIS — E118 Type 2 diabetes mellitus with unspecified complications: Secondary | ICD-10-CM | POA: Diagnosis not present

## 2022-03-03 DIAGNOSIS — I10 Essential (primary) hypertension: Secondary | ICD-10-CM

## 2022-03-03 DIAGNOSIS — E785 Hyperlipidemia, unspecified: Secondary | ICD-10-CM

## 2022-03-03 DIAGNOSIS — I2510 Atherosclerotic heart disease of native coronary artery without angina pectoris: Secondary | ICD-10-CM | POA: Diagnosis not present

## 2022-03-03 NOTE — Patient Instructions (Signed)
Medication Instructions:  Continue same medications *If you need a refill on your cardiac medications before your next appointment, please call your pharmacy*   Lab Work: None ordered   Testing/Procedures: None ordered   Follow-Up: At [facility], you and your health needs are our priority.  As part of our continuing mission to provide you with exceptional heart care, we have created designated Provider Care Teams.  These Care Teams include your primary Cardiologist (physician) and Advanced Practice Providers (APPs -  Physician Assistants and Nurse Practitioners) who all work together to provide you with the care you need, when you need it.  We recommend signing up for the patient portal called "MyChart".  Sign up information is provided on this After Visit Summary.  MyChart is used to connect with patients for Virtual Visits (Telemedicine).  Patients are able to view lab/test results, encounter notes, upcoming appointments, etc.  Non-urgent messages can be sent to your provider as well.   To learn more about what you can do with MyChart, go to [email or web address].    Your next appointment:  1 year   Call in July to schedule Sept appointment     The format for your next appointment: Office   Provider:  Dr.Hochrein   Important Information About Sugar

## 2022-03-25 ENCOUNTER — Ambulatory Visit: Payer: Self-pay | Admitting: Licensed Clinical Social Worker

## 2022-03-25 NOTE — Patient Instructions (Signed)
   It was a pleasure speaking with you today. I am sorry you were unable to keep your phone appointment today.   Please call me to reschedule  Care Coordination team works in collaboration with your primary care doctor.  Please call [phone] if you would like to schedule a phone appointment with a Nurse or Social work Care Coordinator to assist with navigating your physical and mental health needs.     Casimer Lanius, [place] [phone]

## 2022-03-25 NOTE — Patient Outreach (Signed)
  Care Coordination  Initial Visit Note   03/25/2022 Name: [name] MRN: [id number] DOB: 09-12-1955  [place]ard [name] is a 66 y.o. year old male who sees Larose Kells, Alda Berthold, MD for primary care. I spoke with  [name] by phone today.  What matters to the patients health and wellness today?    Requested a call back , did not answer when called    Goals Addressed             This Visit's Progress    Care Coordination Activies       Care Coordination Interventions: Reviewed Care Coordination Services:requested a call back in the PM            SDOH assessments and interventions completed:  No     Care Coordination Interventions Activated:  Yes  Care Coordination Interventions:  Yes, provided   Follow up plan:  requested a call back    Encounter Outcome:  Pt. Request to Call [place], [place] [phone]

## 2022-03-29 ENCOUNTER — Ambulatory Visit (INDEPENDENT_AMBULATORY_CARE_PROVIDER_SITE_OTHER): Payer: Medicare PPO | Admitting: Psychiatry

## 2022-03-29 DIAGNOSIS — F429 Obsessive-compulsive disorder, unspecified: Secondary | ICD-10-CM | POA: Diagnosis not present

## 2022-03-29 DIAGNOSIS — F5081 Binge eating disorder: Secondary | ICD-10-CM | POA: Diagnosis not present

## 2022-03-29 DIAGNOSIS — R69 Illness, unspecified: Secondary | ICD-10-CM

## 2022-03-29 DIAGNOSIS — F3289 Other specified depressive episodes: Secondary | ICD-10-CM | POA: Diagnosis not present
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
## 2022-04-26 ENCOUNTER — Ambulatory Visit (INDEPENDENT_AMBULATORY_CARE_PROVIDER_SITE_OTHER): Payer: Medicare PPO | Admitting: Psychiatry

## 2022-04-26 DIAGNOSIS — F3289 Other specified depressive episodes: Secondary | ICD-10-CM | POA: Diagnosis not present

## 2022-04-26 DIAGNOSIS — F429 Obsessive-compulsive disorder, unspecified: Secondary | ICD-10-CM

## 2022-04-26 DIAGNOSIS — F5081 Binge eating disorder: Secondary | ICD-10-CM | POA: Diagnosis not present

## 2022-04-26 DIAGNOSIS — R69 Illness, unspecified: Secondary | ICD-10-CM | POA: Diagnosis not present
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
## 2022-04-27 ENCOUNTER — Encounter: Payer: Self-pay | Admitting: Physician Assistant

## 2022-04-27 ENCOUNTER — Ambulatory Visit (INDEPENDENT_AMBULATORY_CARE_PROVIDER_SITE_OTHER): Payer: Medicare PPO | Admitting: Physician Assistant

## 2022-04-27 VITALS — BP 132/76 | HR 77 | Ht 72.0 in | Wt 247.0 lb

## 2022-04-27 DIAGNOSIS — F900 Attention-deficit hyperactivity disorder, predominantly inattentive type: Secondary | ICD-10-CM | POA: Diagnosis not present

## 2022-04-27 DIAGNOSIS — F3289 Other specified depressive episodes: Secondary | ICD-10-CM | POA: Diagnosis not present

## 2022-04-27 DIAGNOSIS — I1A0 Resistant hypertension: Secondary | ICD-10-CM | POA: Diagnosis not present

## 2022-04-27 DIAGNOSIS — F411 Generalized anxiety disorder: Secondary | ICD-10-CM

## 2022-04-27 DIAGNOSIS — F429 Obsessive-compulsive disorder, unspecified: Secondary | ICD-10-CM

## 2022-04-27 DIAGNOSIS — C73 Malignant neoplasm of thyroid gland: Secondary | ICD-10-CM | POA: Diagnosis not present

## 2022-04-27 DIAGNOSIS — E1165 Type 2 diabetes mellitus with hyperglycemia: Secondary | ICD-10-CM | POA: Diagnosis not present

## 2022-04-27 LAB — HEMOGLOBIN A1C: Hemoglobin A1C: 6.5

## 2022-04-27 MED ORDER — AMPHETAMINE-DEXTROAMPHETAMINE 10 MG PO TABS: 10.0000 mg | ORAL_TABLET | Freq: Three times a day (TID) | ORAL | 0 refills | Status: DC

## 2022-04-27 MED ORDER — CLONIDINE HCL 0.1 MG PO TABS: 0.1000 mg | ORAL_TABLET | Freq: Every day | ORAL | 1 refills | Status: DC

## 2022-04-27 MED ORDER — ESCITALOPRAM OXALATE 20 MG PO TABS: 30.0000 mg | ORAL_TABLET | Freq: Every day | ORAL | 1 refills | Status: DC

## 2022-04-27 MED ORDER — ALPRAZOLAM 1 MG PO TABS: 1.0000 mg | ORAL_TABLET | Freq: Three times a day (TID) | ORAL | 0 refills | Status: DC | PRN
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
## 2022-05-03 DIAGNOSIS — L578 Other skin changes due to chronic exposure to nonionizing radiation: Secondary | ICD-10-CM | POA: Diagnosis not present

## 2022-05-03 DIAGNOSIS — L218 Other seborrheic dermatitis: Secondary | ICD-10-CM | POA: Diagnosis not present

## 2022-05-11 ENCOUNTER — Ambulatory Visit (INDEPENDENT_AMBULATORY_CARE_PROVIDER_SITE_OTHER): Payer: Medicare PPO | Admitting: Psychiatry

## 2022-05-11 DIAGNOSIS — F429 Obsessive-compulsive disorder, unspecified: Secondary | ICD-10-CM | POA: Diagnosis not present

## 2022-05-11 DIAGNOSIS — F5081 Binge eating disorder: Secondary | ICD-10-CM

## 2022-05-11 DIAGNOSIS — F3289 Other specified depressive episodes: Secondary | ICD-10-CM | POA: Diagnosis not present

## 2022-05-11 DIAGNOSIS — R69 Illness, unspecified: Secondary | ICD-10-CM

## 2022-05-11 DIAGNOSIS — F411 Generalized anxiety disorder: Secondary | ICD-10-CM | POA: Diagnosis not present

## 2022-05-11 NOTE — Progress Notes (Signed)
Psychotherapy Progress Note [facility] [name], PhD LP  Patient ID: [name] [address][name]")    MRN: [id number] Therapy format: Individual psychotherapy Date: 05/11/2022      Start: 9:15a     Stop: 10:05a     Time Spent: 50 min Location: In-person   Session narrative (presenting needs, interim history, self-report of stressors and symptoms, applications of prior therapy, status changes, and interventions made in session) Saw Ms. Hurst first time 2 wks ago.  Initial medication action to continue regimen, increase SSRI, start clonidine, reduce caffeine, continue with slow wean of Xanax.  Personally pleased with the meeting.  2 wks ago started cutting daily Xanax to 2.5 pills, with a 0.5 pill per 6 weeks.  Hx of successful, faster librium taper with [name].    Today says he's been thinking about his childhood, recalls earworms c. 10-12yo along with physical tics and superstitious behaviors (not stepping on lines, number of times turning off lights).  Recalled doing E/RP with those, and in fact remembered doing self-administered E/RP with earworms as an adolescent.  Recalls F's example of being very clean, and demanding [name] and sister do the same, typically throughout Saturdays while other kids were playing.  Has successfully converted to caring to keep things clean, and flexibility in his standards, and partnership with wife.    Reasserts what a relief it was his first 2 years of retirement, before the lawn-mowing moment and earworms returned.  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    Reports son Ellard Artis developed OCD, pretty similar to his own, was put on Lexapro and Adderall in [place], which led to [place] seeing PCP Dr. Larose Kells, who referred to [name], who took son's story as basis to put [name] on Adderall, too.  However, earworms started after going on Adderall, and then many combination  therapies failed after that.  Still, he tried going off Adderall for 2 wks at one point but became lethargic and unfocused.  Would like to wean Adderall later, after working through sedative w/d.  Typical day it takes an hour or more before the earworms start and the compulsive breathing tic.  Now realizes his earworm/compulsion issue happens any time he is awake.  Box breathing helps temporarily.  Doing some thought replacement (find happy thoughts instead of distressed).  Using Headspace app to meditate, and to do mindful observation.  Chewing gum helps interrupt stress and compulsions.  Music helps as a focus, or nature sounds (e.g., babbling brook recording on Alexa).  Currently reordering his daily life to 1 or 2 "jobs" (responsibilities) and the rest pursuing interests.  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  Winter coming is bringing on fatigue/lethargy earlier, so less able to write, etc.  Can get some benefit out of TV documentaries.    Encouraged overall approach to do E/RP for himself in intentional "rounds", like a boxing match or workout, without subtly making himself responsible to keep constant vigilance and "work" on OCD every waking moment.  Compared the idea of daylong "fighting" to boxing without a bell or working out without a limit, and to his father's Saturday housework approach, noting it would have to fatigue him too much.  Better to target an area of compulsions and have both time on and  time off for working at it, lest it fatigue him too much and further entrench a compulsive mindset that perpetuates feeling inadequate.  Re-emphasized setting his expectations not to be "controlling" OCD but to be improving at (1) letting impulses happen without action, (2) switching attention when desired, and (3) flexibility in what he does with an impulse.  As  examples, noted with grimacing that he could, instead of trying not to grimace, to grimace and hold for maybe 90 seconds and "out-compulsion the compulsion, or with "earworm" breathing try to deviate from the pattern, substitute box breathing, etc.  Therapeutic modalities: Cognitive Behavioral Therapy and Solution-Oriented/Positive Psychology  Mental Status/Observations:  Appearance:   Casual     Behavior:  Appropriate  Motor:  Normal  Speech/Language:   Clear and Coherent  Affect:  Appropriate  Mood:  anxious and dysthymic  Thought process:  normal  Thought content:    Intrusive thoughts  Sensory/Perceptual disturbances:    WNL  Orientation:  Fully oriented  Attention:  Good    Concentration:  Fair  Memory:  WNL  Insight:    Good  Judgment:   Good  Impulse Control:  Good   Risk Assessment: Danger to Self: No Self-injurious Behavior: No Danger to Others: No Physical Aggression / Violence: No Duty to Warn: No Access to Firearms a concern: No  Assessment of progress:  progressing  Diagnosis:   ICD-10-CM   1. Generalized anxiety disorder  F41.1     2. Obsessive-compulsive disorder, unspecified type  F42.9     3. 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 r/o (1) PTSD, childhood onset with multiple traumas, (2) ADHD by self-report, (3) polypharmacy problem (stimulant v. sedative)  R69     5. Binge eating disorder - in remission  F50.81      Plan:  Coping skills  Self-affirm philosophy of E/RP over trying to control unwanted thoughts and experiences Practice nonjudgmental observation of intrusive thoughts where possible Inventory intrusive thought patterns in order to externalize and consider priority to confront  Cont use of meditation app Purposeful activities like soil research and finishing professional projects, framed as competing, attractive activity, not "distraction" Choice of walking/biking to use nervous energy or paced breathing to relax despite  anxiety Music Gum Continue approaching household responsibilities as "care to" (authentic) rather than "have to" (father's voice) and willingness to break up the pattern into "rounds" (a la boxing) E/RP practices Merely observe without acting on compulsion Change the form, e.g., breathe off schedule, substitute box breathing, grimace and hold 90 sec Nutritional  Cont improvements to diet and eating patterns Note impulse to overeat as a form of sedation, seek alternative behavior before resorting to food Reduce/restrain caffeine Medication service Continue weaning sedative, may accelerate safely beyond 0.'5mg'$  per 6 wks Notice if reduction in stimulant needs to co-occur for anxiety management, but OK to separate medication projects for stability Social support Assess for marital issues, e.g., wife's long hours and comfort with spending time together per her own retirement trajectory [employer] sharing life story with friend Look back into scouting as a possible outlet and meaningful way to give/work in the world Other recommendations/advice as may be noted above Continue to utilize previously learned skills ad lib Maintain medication as prescribed and work faithfully with relevant prescriber(s) if any changes are desired or seem indicated Call the clinic on-call service, 988/hotline, 911, or present to [facility] or ER if any life-threatening psychiatric crisis Return for as already scheduled. Already scheduled visit  in this office 05/31/2022.  Blanchie Serve, PhD [name], PhD LP Clinical Psychologist, [facility] [address] [place], [place] [address] [phone]

## 2022-05-13 ENCOUNTER — Encounter: Payer: Self-pay | Admitting: Internal Medicine

## 2022-05-25 ENCOUNTER — Ambulatory Visit: Payer: Medicare PPO | Admitting: Internal Medicine

## 2022-05-31 ENCOUNTER — Ambulatory Visit: Payer: Medicare PPO | Admitting: Psychiatry

## 2022-05-31 ENCOUNTER — Other Ambulatory Visit: Payer: Self-pay | Admitting: Physician Assistant

## 2022-05-31 ENCOUNTER — Ambulatory Visit (INDEPENDENT_AMBULATORY_CARE_PROVIDER_SITE_OTHER): Payer: Medicare PPO | Admitting: Physician Assistant

## 2022-05-31 ENCOUNTER — Encounter: Payer: Self-pay | Admitting: Physician Assistant

## 2022-05-31 VITALS — BP 149/82 | HR 67

## 2022-05-31 DIAGNOSIS — N281 Cyst of kidney, acquired: Secondary | ICD-10-CM | POA: Diagnosis not present

## 2022-05-31 DIAGNOSIS — F331 Major depressive disorder, recurrent, moderate: Secondary | ICD-10-CM | POA: Diagnosis not present

## 2022-05-31 DIAGNOSIS — F429 Obsessive-compulsive disorder, unspecified: Secondary | ICD-10-CM | POA: Diagnosis not present

## 2022-05-31 DIAGNOSIS — F411 Generalized anxiety disorder: Secondary | ICD-10-CM | POA: Diagnosis not present

## 2022-05-31 MED ORDER — ESCITALOPRAM OXALATE 20 MG PO TABS: 40.0000 mg | ORAL_TABLET | Freq: Every day | ORAL | 1 refills | Status: DC

## 2022-05-31 NOTE — Patient Instructions (Signed)
Wean off the Adderall by taking 10 mg, 1 pill at breakfast and 1 at lunch for 1 week, then 1 pill at breakfast for 1 week, then stop. If there are occasions when he really needs more energy or motivation, you can take 1 pill in the mornings, but not over twice per week.

## 2022-05-31 NOTE — Progress Notes (Signed)
[facility]  Patient ID: KINSLER SOEDER,  MRN: [id number]  PCP: Colon Branch, MD  Date of Evaluation: 05/31/2022 Time spent:20 minutes  Chief Complaint:  Chief Complaint   Anxiety; Depression; Insomnia; Follow-up    HISTORY/CURRENT STATUS: HPI for routine med check.  Since increasing the Lexapro 6 weeks ago, he feels happier, maybe 50% better. No SE, Patient is able to enjoy things.  Feels more able to get out of bed.  Energy and motivation are good, since he's on the Adderall. No extreme sadness, tearfulness, or feelings of hopelessness.  Sleeps well and feels rested when he gets up.  ADLs and personal hygiene are normal.   Denies any changes in remembering things.  Appetite has not changed.  Weight is stable.  Denies suicidal or homicidal thoughts.  He cut back on caffeine to only 1 mug now. Still having anxiety. Doesn't feel like he can get off the Xanax, he's weaned off before, but had to use Librium to get off of it.  Even with the decrease of 0.5 mg he can tell that he is more anxious than when he was taking the higher dose.  He usually gets 3 to 4 hours of relief and then the Xanax wears off.  He does not have panic attacks but gets extremely uneasy, like something bad may happen.  He has been on Adderall and Xanax for awhile, per [name]. He has wanted to get off both meds at some point but thinks the Xanax is helping the most. He was originally put on Adderall to help w/ energy and motivation, not focus or concentration.   Patient denies increased energy with decreased need for sleep, increased talkativeness, racing thoughts, impulsivity or risky behaviors, increased spending, increased libido, grandiosity, increased irritability or anger, paranoia, or hallucinations.  Denies dizziness, syncope, seizures, numbness, tingling, tremor, tics, unsteady gait, slurred speech, confusion. Denies muscle or joint pain, stiffness, or dystonia.  Individual Medical History/  Review of Systems: Changes? :No   Past Psychiatric History:    No mental health hospitalizations. No suicide attempts.    Past medications for mental health diagnoses include: Lamictal, Lexapro, Adderall, Xanax, Trazodone, Ativan, Vraylar, Wellbutrin, luvox, Buspar, Zoloft, prozac, Depakote  Allergies: Naproxen and Niacin  Current Medications:  Current Outpatient Medications:    allopurinol (ZYLOPRIM) 300 MG tablet, Take 300 mg by mouth daily., Disp: , Rfl:    ALPRAZolam (XANAX) 1 MG tablet, Take 1 tablet (1 mg total) by mouth 3 (three) times daily as needed. But max of 2.5 mg total per day, Disp: 75 tablet, Rfl: 0   amLODipine-benazepril (LOTREL) 10-40 MG capsule, Take 1 capsule by mouth at bedtime., Disp: 90 capsule, Rfl: 1   amphetamine-dextroamphetamine (ADDERALL) 10 MG tablet, Take 1 tablet (10 mg total) by mouth with breakfast, with lunch, and with evening meal., Disp: 90 tablet, Rfl: 0   aspirin 81 MG tablet, Take 81 mg by mouth at bedtime. , Disp: , Rfl:    atorvastatin (LIPITOR) 20 MG tablet, TAKE ONE TABLET BY MOUTH DAILY, Disp: 90 tablet, Rfl: 1   Blood Glucose Monitoring Suppl (ONE TOUCH ULTRA SYSTEM KIT) w/Device KIT, 1 kit by Does not apply route once., Disp: 1 each, Rfl: 0   cholecalciferol (VITAMIN D3) 25 MCG (1000 UNIT) tablet, Take 1,000 Units by mouth daily., Disp: , Rfl:    cloNIDine (CATAPRES) 0.1 MG tablet, Take 1 tablet (0.1 mg total) by mouth at bedtime., Disp: 30 tablet, Rfl: 1   Cyanocobalamin (VITAMIN B  12 PO), Take by mouth., Disp: , Rfl:    hydrochlorothiazide (HYDRODIURIL) 25 MG tablet, Take 25 mg by mouth daily before breakfast., Disp: , Rfl:    insulin degludec (TRESIBA FLEXTOUCH) 200 UNIT/ML FlexTouch Pen, Inject 45 Units into the skin daily., Disp: , Rfl:    Insulin Pen Needle (BD PEN NEEDLE NANO U/F) 32G X 4 MM MISC, TO USE WITH INSULIN, Disp: 100 each, Rfl: 0   JARDIANCE 25 MG TABS tablet, Take 1 tablet by mouth daily., Disp: , Rfl:    Lancets (ONETOUCH  ULTRASOFT) lancets, Check blood sugar no more than twice daily, Disp: 100 each, Rfl: 12   levothyroxine (SYNTHROID) 175 MCG tablet, Take 175 mcg by mouth daily before breakfast., Disp: , Rfl:    metFORMIN (GLUCOPHAGE) 1000 MG tablet, Take 0.5 tablets (500 mg total) by mouth 2 (two) times daily with a meal., Disp: , Rfl:    Multiple Vitamin (MULTI-VITAMIN) tablet, Take 1 tablet by mouth daily., Disp: , Rfl:    ONETOUCH ULTRA test strip, CHECK BLOOD SUGAR NO MORE THAN TWICE DAILY, Disp: 100 each, Rfl: 1   potassium citrate (UROCIT-K) 10 MEQ (1080 MG) SR tablet, Take 1 tablet by mouth 2 (two) times daily with a meal., Disp: , Rfl:    spironolactone (ALDACTONE) 25 MG tablet, Take 25 mg by mouth daily., Disp: , Rfl:    tamsulosin (FLOMAX) 0.4 MG CAPS, Take 0.4 mg by mouth at bedtime., Disp: , Rfl:    tirzepatide (MOUNJARO) 5 MG/0.5ML Pen, Inject 5 mg into the skin once a week., Disp: , Rfl:    traZODone (DESYREL) 50 MG tablet, Take 25 mg by mouth 3 (three) times daily. As needed for anxiety, Disp: , Rfl:    escitalopram (LEXAPRO) 20 MG tablet, Take 2 tablets (40 mg total) by mouth daily., Disp: 60 tablet, Rfl: 1   metoprolol succinate (TOPROL-XL) 50 MG 24 hr tablet, Take 50 mg by mouth daily. Take with or immediately following a meal. (Patient not taking: Reported on 05/31/2022), Disp: , Rfl:    Zoster Vaccine Adjuvanted (SHINGRIX) injection, Inject 0.5 mLs into the muscle. (Patient not taking: Reported on 03/03/2022), Disp: 1 each, Rfl: 1 Medication Side Effects: none  Family Medical/ Social History: Changes? No  MENTAL HEALTH EXAM:  Blood pressure (!) 149/82, pulse 67.There is no height or weight on file to calculate BMI.  General Appearance: Casual and Well Groomed  Eye Contact:  Good  Speech:  Clear and Coherent and Normal Rate  Volume:  Normal  Mood:  Euthymic  Affect:  Congruent  Thought Process:  Goal Directed and Descriptions of Associations: Circumstantial  Orientation:  Full (Time,  Place, and Person)  Thought Content: Logical   Suicidal Thoughts:  No  Homicidal Thoughts:  No  Memory:  WNL  Judgement:  Good  Insight:  Good  Psychomotor Activity:  Normal  Concentration:  Concentration: Good  Recall:  Good  Fund of Knowledge: Good  Language: Good  Assets:  Desire for Improvement Financial Resources/Insurance Housing Transportation  ADL's:  Intact  Cognition: WNL  Prognosis:  Good   DIAGNOSES:    ICD-10-CM   1. Generalized anxiety disorder  F41.1     2. Obsessive-compulsive disorder, unspecified type  F42.9     3. Major depressive disorder, recurrent episode, moderate (HCC)  F33.1       Receiving Psychotherapy: Yes   Dr. Jonni Sanger [place]  RECOMMENDATIONS:  PDMP reviewed.   Adderall filled 05/27/2022, written by me Xanax filled 05/27/2022 written  by [name]. Adderall filled 04/29/2022 written by [name].  Xanax filled 04/27/2022 written by me.  All previous Adderall and Xanax prescriptions written by [name].  I discussed the fact that Xanax was prescribed by me on 04/27/2022, Adderall by [name] 04/29/2022, Xanax by [name] on 05/27/2022, and Adderall by me 05/27/2022.  I do not think this was intentional, but I made him aware that he cannot get controlled substances by both of [place].  Bonney Leitz, CMA has called Dr. Starleen Arms office to let her know that he has gotten controlled substances by both of [place] in the past 6 weeks, and also notified his pharmacy to cancel any controlled substance refills from me until this is sorted out.  Patient understands this plan.  Due to the anxiety, I think it is best to wean off the Adderall, not the Xanax at this time.  Pros and cons discussed with him.  He agrees with this plan.  He was originally prescribed the Adderall for energy and motivation, hopefully since the depression is getting better, he may not have such an issue with energy and motivation.  He would like to wean off the Adderall.  Also discussed the depression.   Recommend increasing the dose, he understands this is over the maximum FDA recommended dose however it is sometimes required according to [place] and we will watch for any problems associated with that.   Continue Xanax 1 mg, 1 p.o. 3 times daily as needed, prescription is written for maximum of 2.5 mg/day, I understand he may need to take 3 mg/day so he may run out early.  Okay to send in refill if that does occur. Wean off Adderall 10 mg by taking 1 at breakfast and 1 at lunch for 1 week, then 1 q am for 1 week, then stop. He may take one qam occasionally, in an emergency.  Continue clonidine 0.1 mg, 1 p.o. nightly. Increase Lexapro 20 mg to 2 p.o. daily. Continue trazodone 50 mg, 1/2-1 p.o. 3 times daily as needed anxiety from another provider. Continue therapy with Dr. [name]. Return in 4 weeks.  Donnal Moat, PA-C

## 2022-06-01 ENCOUNTER — Ambulatory Visit: Payer: Medicare PPO | Admitting: Psychiatry

## 2022-06-01 DIAGNOSIS — Z125 Encounter for screening for malignant neoplasm of prostate: Secondary | ICD-10-CM | POA: Diagnosis not present

## 2022-06-01 DIAGNOSIS — N281 Cyst of kidney, acquired: Secondary | ICD-10-CM | POA: Diagnosis not present

## 2022-06-01 DIAGNOSIS — N400 Enlarged prostate without lower urinary tract symptoms: Secondary | ICD-10-CM | POA: Diagnosis not present

## 2022-06-01 DIAGNOSIS — N200 Calculus of kidney: Secondary | ICD-10-CM | POA: Diagnosis not present

## 2022-06-01 DIAGNOSIS — Z87442 Personal history of urinary calculi: Secondary | ICD-10-CM | POA: Diagnosis not present

## 2022-06-01 DIAGNOSIS — Z8042 Family history of malignant neoplasm of prostate: Secondary | ICD-10-CM | POA: Diagnosis not present

## 2022-06-02 ENCOUNTER — Ambulatory Visit (INDEPENDENT_AMBULATORY_CARE_PROVIDER_SITE_OTHER): Payer: Medicare PPO | Admitting: Psychiatry

## 2022-06-02 ENCOUNTER — Ambulatory Visit: Payer: Medicare PPO

## 2022-06-02 DIAGNOSIS — F411 Generalized anxiety disorder: Secondary | ICD-10-CM

## 2022-06-02 DIAGNOSIS — F331 Major depressive disorder, recurrent, moderate: Secondary | ICD-10-CM | POA: Diagnosis not present

## 2022-06-02 DIAGNOSIS — R69 Illness, unspecified: Secondary | ICD-10-CM | POA: Diagnosis not present

## 2022-06-02 DIAGNOSIS — F429 Obsessive-compulsive disorder, unspecified: Secondary | ICD-10-CM | POA: Diagnosis not present
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
## 2022-06-14 ENCOUNTER — Other Ambulatory Visit: Payer: Self-pay

## 2022-06-15 ENCOUNTER — Ambulatory Visit: Payer: Medicare PPO | Admitting: Internal Medicine

## 2022-06-15 ENCOUNTER — Encounter: Payer: Self-pay | Admitting: Internal Medicine

## 2022-06-15 VITALS — BP 130/68 | HR 80 | Temp 97.6°F | Resp 18 | Ht 72.0 in | Wt 239.2 lb

## 2022-06-15 DIAGNOSIS — F429 Obsessive-compulsive disorder, unspecified: Secondary | ICD-10-CM

## 2022-06-15 DIAGNOSIS — F419 Anxiety disorder, unspecified: Secondary | ICD-10-CM | POA: Diagnosis not present

## 2022-06-15 DIAGNOSIS — E782 Mixed hyperlipidemia: Secondary | ICD-10-CM | POA: Diagnosis not present

## 2022-06-15 DIAGNOSIS — E079 Disorder of thyroid, unspecified: Secondary | ICD-10-CM

## 2022-06-15 DIAGNOSIS — Z794 Long term (current) use of insulin: Secondary | ICD-10-CM

## 2022-06-15 DIAGNOSIS — E1142 Type 2 diabetes mellitus with diabetic polyneuropathy: Secondary | ICD-10-CM | POA: Diagnosis not present

## 2022-06-15 DIAGNOSIS — I10 Essential (primary) hypertension: Secondary | ICD-10-CM

## 2022-06-15 NOTE — Patient Instructions (Addendum)
Vaccines I recommend:  RSV vaccine Shingrix (shingles)    Check the  blood pressure regularly BP GOAL is between 110/65 and  135/85. If it is consistently higher or lower, let me know     GO TO THE LAB : Get the blood work     [place], [place] back for   a physical exam in 4 months
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
## 2022-06-16 ENCOUNTER — Ambulatory Visit: Payer: Medicare PPO | Admitting: Psychiatry

## 2022-06-16 LAB — ALT: ALT: 24 U/L (ref 0–53)

## 2022-06-16 LAB — MICROALBUMIN / CREATININE URINE RATIO
Creatinine,U: 79.6 mg/dL
Microalb Creat Ratio: 0.9 mg/g (ref 0.0–30.0)
Microalb, Ur: <0.7 mg/dL (ref 0.0–1.9)

## 2022-06-16 LAB — CBC WITH DIFFERENTIAL/PLATELET
Basophils Absolute: 0 10*3/uL (ref 0.0–0.1)
Basophils Relative: 0.5 % (ref 0.0–3.0)
Eosinophils Absolute: 0.3 10*3/uL (ref 0.0–0.7)
Eosinophils Relative: 3.5 % (ref 0.0–5.0)
HCT: 44.1 % (ref 39.0–52.0)
Hemoglobin: 15.1 g/dL (ref 13.0–17.0)
Lymphocytes Relative: 22.6 % (ref 12.0–46.0)
Lymphs Abs: 2.1 10*3/uL (ref 0.7–4.0)
MCHC: 34.2 g/dL (ref 30.0–36.0)
MCV: 90.3 fl (ref 78.0–100.0)
Monocytes Absolute: 0.8 10*3/uL (ref 0.1–1.0)
Monocytes Relative: 8.8 % (ref 3.0–12.0)
Neutro Abs: 6.1 10*3/uL (ref 1.4–7.7)
Neutrophils Relative %: 64.6 % (ref 43.0–77.0)
Platelets: 370 10*3/uL (ref 150.0–400.0)
RBC: 4.89 Mil/uL (ref 4.22–5.81)
RDW: 14.3 % (ref 11.5–15.5)
WBC: 9.4 10*3/uL (ref 4.0–10.5)

## 2022-06-16 LAB — LIPID PANEL
Cholesterol: 103 mg/dL (ref 0–200)
HDL: 30.2 mg/dL — ABNORMAL LOW (ref >39.00)
NonHDL: 72.63
Total CHOL/HDL Ratio: 3
Triglycerides: 275 mg/dL — ABNORMAL HIGH (ref 0.0–149.0)
VLDL: 55 mg/dL — ABNORMAL HIGH (ref 0.0–40.0)

## 2022-06-16 LAB — LDL CHOLESTEROL, DIRECT: Direct LDL: 51 mg/dL

## 2022-06-16 LAB — TSH: TSH: 0.17 u[IU]/mL — ABNORMAL LOW (ref 0.35–5.50)

## 2022-06-16 LAB — AST: AST: 20 U/L (ref 0–37)

## 2022-06-16 NOTE — Assessment & Plan Note (Signed)
Available labs from December 2023 PSA 1.7, creatinine 1.0, potassium 4.4.  No recent TSH DM: Per Endo. Thyroid disease: Per Endo, I do not see any recent TSH, check TSH. HTN: Reports excellent control at home particularly after he started the spironolactone.  Current regimen includes Lotrel, clonidine, metoprolol, Aldactone,.  Recent BMP okay.  Check CBC Hyperlipidemia: On atorvastatin, check AST ALT FLP Anxiety depression OCD ADD: [name] is not part of his insurance network, transitioning to another MD.  Symptoms not well-controlled.  No suicidal ideas.  Extensive listening therapy provided. Preventive: Had a flu and recent COVID-vaccine. Had shingrix x 1 RTC 4 months CPX

## 2022-06-18 MED ORDER — LEVOTHYROXINE SODIUM 150 MCG PO TABS
150.0000 ug | ORAL_TABLET | Freq: Every day | ORAL | 0 refills | Status: DC
Start: 2022-06-18 — End: 2022-10-12

## 2022-06-18 NOTE — Addendum Note (Signed)
Addended byDamita Dunnings D on: 06/18/2022 10:28 AM   Modules accepted: Orders

## 2022-06-30 ENCOUNTER — Ambulatory Visit: Payer: Medicare PPO | Admitting: Physician Assistant

## 2022-07-05 ENCOUNTER — Ambulatory Visit: Payer: Medicare PPO | Admitting: Psychiatry

## 2022-07-07 DIAGNOSIS — R599 Enlarged lymph nodes, unspecified: Secondary | ICD-10-CM | POA: Diagnosis not present

## 2022-07-07 DIAGNOSIS — E890 Postprocedural hypothyroidism: Secondary | ICD-10-CM | POA: Diagnosis not present

## 2022-07-07 DIAGNOSIS — C73 Malignant neoplasm of thyroid gland: Secondary | ICD-10-CM | POA: Diagnosis not present

## 2022-07-14 ENCOUNTER — Encounter: Payer: Self-pay | Admitting: Gastroenterology

## 2022-07-19 ENCOUNTER — Ambulatory Visit: Payer: Medicare PPO | Admitting: Psychiatry

## 2022-08-01 DIAGNOSIS — R0981 Nasal congestion: Secondary | ICD-10-CM | POA: Diagnosis not present

## 2022-08-01 DIAGNOSIS — U071 COVID-19: Secondary | ICD-10-CM | POA: Diagnosis not present

## 2022-08-01 DIAGNOSIS — J029 Acute pharyngitis, unspecified: Secondary | ICD-10-CM | POA: Diagnosis not present

## 2022-08-01 DIAGNOSIS — M791 Myalgia, unspecified site: Secondary | ICD-10-CM | POA: Diagnosis not present

## 2022-08-09 ENCOUNTER — Ambulatory Visit: Payer: Medicare PPO | Admitting: Psychiatry

## 2022-08-10 ENCOUNTER — Encounter: Payer: Self-pay | Admitting: Internal Medicine

## 2022-08-11 DIAGNOSIS — I10 Essential (primary) hypertension: Secondary | ICD-10-CM | POA: Diagnosis not present

## 2022-08-11 DIAGNOSIS — G4733 Obstructive sleep apnea (adult) (pediatric): Secondary | ICD-10-CM | POA: Diagnosis not present

## 2022-08-12 ENCOUNTER — Encounter: Payer: Self-pay | Admitting: Internal Medicine

## 2022-08-23 DIAGNOSIS — C73 Malignant neoplasm of thyroid gland: Secondary | ICD-10-CM | POA: Diagnosis not present

## 2022-08-23 DIAGNOSIS — I1A0 Resistant hypertension: Secondary | ICD-10-CM | POA: Diagnosis not present

## 2022-08-23 DIAGNOSIS — E890 Postprocedural hypothyroidism: Secondary | ICD-10-CM | POA: Diagnosis not present

## 2022-08-23 DIAGNOSIS — E1165 Type 2 diabetes mellitus with hyperglycemia: Secondary | ICD-10-CM | POA: Diagnosis not present

## 2022-08-23 LAB — HEMOGLOBIN A1C: Hemoglobin A1C: 6.6

## 2022-08-28 ENCOUNTER — Other Ambulatory Visit: Payer: Self-pay | Admitting: Physician Assistant

## 2022-08-29 NOTE — Telephone Encounter (Signed)
Please call to schedule an appt.  

## 2022-09-03 NOTE — Telephone Encounter (Signed)
Lvm for patient to call and schedule 

## 2022-09-06 DIAGNOSIS — H524 Presbyopia: Secondary | ICD-10-CM | POA: Diagnosis not present

## 2022-09-06 DIAGNOSIS — E119 Type 2 diabetes mellitus without complications: Secondary | ICD-10-CM | POA: Diagnosis not present

## 2022-09-06 DIAGNOSIS — H5203 Hypermetropia, bilateral: Secondary | ICD-10-CM | POA: Diagnosis not present

## 2022-09-06 LAB — HM DIABETES EYE EXAM

## 2022-09-07 ENCOUNTER — Ambulatory Visit: Payer: Medicare PPO | Admitting: Psychiatry

## 2022-10-06 ENCOUNTER — Telehealth: Payer: Self-pay | Admitting: Internal Medicine

## 2022-10-06 NOTE — Telephone Encounter (Signed)
FYI: This call has been transferred to Access Nurse. Once the result note has been entered staff can address the message at that time.  Patient called in with the following symptoms:  Red Word:loss of consciousness ( passed out) , dizziness , and Low BP   Please advise at Mobile [phone] (mobile)  Message is routed to Provider Pool and [facility] Triage

## 2022-10-07 NOTE — Telephone Encounter (Signed)
Initial Comment Caller states PT is having low blood pressure, dizziness, weakness, and almost fainting GOTO Facility Not Listed [facility] Translation No Nurse Assessment Nurse: Thornton Dales, RN, Lilly Date/Time (Eastern Time): 10/06/2022 5:06:24 PM Confirm and document reason for call. If symptomatic, describe symptoms. ---Caller reports low blood pressure, weakness and dizziness. Does the patient have any new or worsening symptoms? ---Yes Will a triage be completed? ---Yes Related visit to physician within the last 2 weeks? ---No Does the PT have any chronic conditions? (i.e. diabetes, asthma, this includes High risk factors for pregnancy, etc.) ---Yes List chronic conditions. ---HTN, DM Is this a behavioral health or substance abuse call? ---No Guidelines Guideline Title Affirmed Question Affirmed Notes Nurse Date/Time (Eastern Time) Blood Pressure - Low [1] Fall in systolic BP > 20 mm Hg from normal AND [2] dizzy, lightheaded, or weak Westenhaver, RN, Lilly 10/06/2022 5:08:05 PM Disp. Time Lamount Cohen Time) Disposition Final User 10/06/2022 5:05:06 PM Send to Urgent Queue Nevin Bloodgood 10/06/2022 5:13:20 PM Go to ED Now (or PCP triage) Yes Thornton Dales, RN, Lilly PLEASE NOTE: All timestamps contained within this report are represented as [place] Standard Time. CONFIDENTIALTY NOTICE: This fax transmission is intended only for the addressee. It contains information that is legally privileged, confidential or otherwise protected from use or disclosure. If you are not the intended recipient, you are strictly prohibited from reviewing, disclosing, copying using or disseminating any of this information or taking any action in reliance on or regarding this information. If you have received this fax in error, please notify us immediately by telephone so that we can arrange for its return to [place]. Phone: [phone], Toll-Free: [phone], Fax: [phone] Page: 2 of 2 Call Id:  [id number] Final Disposition 10/06/2022 5:13:20 PM Go to ED Now (or PCP triage) Tawni Pummel, RN, [place] Caller Disagree/Comply Comply Caller Understands Yes PreDisposition Call Doctor Care Advice Given Per Guideline GO TO ED NOW (OR PCP TRIAGE): * IF NO PCP (PRIMARY CARE PROVIDER) SECOND-LEVEL TRIAGE: You need to be seen within the next hour. Go to the ED/UCC at _____________ Hospital. Leave as soon as you can. ANOTHER ADULT SHOULD DRIVE: * It is better and safer if another adult drives instead of you. CARE ADVICE given per Low Blood Pressure (Adult) guideline. Comments User: Kenard Gower, RN Date/Time Lamount Cohen Time): 10/06/2022 5:07:15 PM 12pm bp 90/58 hr 77 current 92/65 hr 75 Referrals GO TO FACILITY OTHER - SPECIFY

## 2022-10-07 NOTE — Telephone Encounter (Signed)
Spoke w/ Pt- BP and blood sugar was low yesterday from 12pm to 5pm, his blood pressures and blood sugars are doing better this morning. 122/84, blood sugars 120. He did skip his insulin dose last night, informed him to let endo know for further recommendations. Pt verbalized understanding. Instructed to let us know if he starts having sx's again.

## 2022-10-07 NOTE — Telephone Encounter (Signed)
LMOM asking for call back, checking on Pt- I don't see that he went to ED last night.

## 2022-10-12 ENCOUNTER — Ambulatory Visit (INDEPENDENT_AMBULATORY_CARE_PROVIDER_SITE_OTHER): Payer: Medicare PPO | Admitting: *Deleted

## 2022-10-12 VITALS — BP 106/70 | HR 67 | Ht 72.0 in | Wt 236.0 lb

## 2022-10-12 DIAGNOSIS — Z0000 Encounter for general adult medical examination without abnormal findings: Secondary | ICD-10-CM | POA: Diagnosis not present
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
## 2022-10-12 NOTE — Patient Instructions (Signed)
Leonard [name] , Thank you for taking time to come for your Medicare Wellness Visit. I appreciate your ongoing commitment to your health goals. Please review the following plan we discussed and let me know if I can assist you in the future.      This is a list of the screening recommended for you and due dates:  Health Maintenance  Topic Date Due   COVID-19 Vaccine (6 - 2023-24 season) 02/26/2022   Zoster (Shingles) Vaccine (2 of 2) 04/05/2022   Colon Cancer Screening  07/06/2022   Yearly kidney function blood test for diabetes  09/19/2022   Hemoglobin A1C  10/26/2022   Complete foot exam   12/22/2022   Flu Shot  01/27/2023   DTaP/Tdap/Td vaccine (2 - Td or Tdap) 06/08/2023   Yearly kidney health urinalysis for diabetes  06/16/2023   Eye exam for diabetics  09/06/2023   Medicare Annual Wellness Visit  10/12/2023   Pneumonia Vaccine  Completed   Hepatitis C Screening: USPSTF Recommendation to screen - Ages 67-79 yo.  Completed   HPV Vaccine  Aged Out    Next appointment: Follow up in one year for your annual wellness visit.  Preventive Care 67 Years and Older, Male Preventive care refers to lifestyle choices and visits with your health care provider that can promote health and wellness. What does preventive care include? A yearly physical exam. This is also called an annual well check. Dental exams once or twice a year. Routine eye exams. Ask your health care provider how often you should have your eyes checked. Personal lifestyle choices, including: Daily care of your teeth and gums. Regular physical activity. Eating a healthy diet. Avoiding tobacco and drug use. Limiting alcohol use. Practicing safe sex. Taking low doses of aspirin every day. Taking vitamin and mineral supplements as recommended by your health care provider. What happens during an annual well check? The services and screenings done by your health care provider during your annual well check will depend on  your age, overall health, lifestyle risk factors, and family history of disease. Counseling  Your health care provider may ask you questions about your: Alcohol use. Tobacco use. Drug use. Emotional well-being. Home and relationship well-being. Sexual activity. Eating habits. History of falls. Memory and ability to understand (cognition). Work and work Astronomer. Screening  You may have the following tests or measurements: Height, weight, and BMI. Blood pressure. Lipid and cholesterol levels. These may be checked every 5 years, or more frequently if you are over 83 years old. Skin check. Lung cancer screening. You may have this screening every year starting at age 67 if you have a 30-pack-year history of smoking and currently smoke or have quit within the past 15 years. Fecal occult blood test (FOBT) of the stool. You may have this test every year starting at age 67 Flexible sigmoidoscopy or colonoscopy. You may have a sigmoidoscopy every 5 years or a colonoscopy every 10 years starting at age 32. Prostate cancer screening. Recommendations will vary depending on your family history and other risks. Hepatitis C blood test. Hepatitis B blood test. Sexually transmitted disease (STD) testing. Diabetes screening. This is done by checking your blood sugar (glucose) after you have not eaten for a while (fasting). You may have this done every 1-3 years. Abdominal aortic aneurysm (AAA) screening. You may need this if you are a current or former smoker. Osteoporosis. You may be screened starting at age 67 if you are at high risk. Talk with your  health care provider about your test results, treatment options, and if necessary, the need for more tests. Vaccines  Your health care provider may recommend certain vaccines, such as: Influenza vaccine. This is recommended every year. Tetanus, diphtheria, and acellular pertussis (Tdap, Td) vaccine. You may need a Td booster every 10 years. Zoster  vaccine. You may need this after age 67. Pneumococcal 13-valent conjugate (PCV13) vaccine. One dose is recommended after age 62. Pneumococcal polysaccharide (PPSV23) vaccine. One dose is recommended after age 65. Talk to your health care provider about which screenings and vaccines you need and how often you need them. This information is not intended to replace advice given to you by your health care provider. Make sure you discuss any questions you have with your health care provider. Document Released: 07/11/2015 Document Revised: 03/03/2016 Document Reviewed: 04/15/2015 Elsevier Interactive Patient Education  2017 [place] Prevention in the Home Falls can cause injuries. They can happen to people of all ages. There are many things you can do to make your home safe and to help prevent falls. What can I do on the outside of my home? Regularly fix the edges of walkways and driveways and fix any cracks. Remove anything that might make you trip as you walk through a door, such as a raised step or threshold. Trim any bushes or trees on the path to your home. Use bright outdoor lighting. Clear any walking paths of anything that might make someone trip, such as rocks or tools. Regularly check to see if handrails are loose or broken. Make sure that both sides of any steps have handrails. Any raised decks and porches should have guardrails on the edges. Have any leaves, snow, or ice cleared regularly. Use sand or salt on walking paths during winter. Clean up any spills in your garage right away. This includes oil or grease spills. What can I do in the bathroom? Use night lights. Install grab bars by the toilet and in the tub and shower. Do not use towel bars as grab bars. Use non-skid mats or decals in the tub or shower. If you need to sit down in the shower, use a plastic, non-slip stool. Keep the floor dry. Clean up any water that spills on the floor as soon as it happens. Remove  soap buildup in the tub or shower regularly. Attach bath mats securely with double-sided non-slip rug tape. Do not have throw rugs and other things on the floor that can make you trip. What can I do in the bedroom? Use night lights. Make sure that you have a light by your bed that is easy to reach. Do not use any sheets or blankets that are too big for your bed. They should not hang down onto the floor. Have a firm chair that has side arms. You can use this for support while you get dressed. Do not have throw rugs and other things on the floor that can make you trip. What can I do in the kitchen? Clean up any spills right away. Avoid walking on wet floors. Keep items that you use a lot in easy-to-reach places. If you need to reach something above you, use a strong step stool that has a grab bar. Keep electrical cords out of the way. Do not use floor polish or wax that makes floors slippery. If you must use wax, use non-skid floor wax. Do not have throw rugs and other things on the floor that can make you trip. What  can I do with my stairs? Do not leave any items on the stairs. Make sure that there are handrails on both sides of the stairs and use them. Fix handrails that are broken or loose. Make sure that handrails are as long as the stairways. Check any carpeting to make sure that it is firmly attached to the stairs. Fix any carpet that is loose or worn. Avoid having throw rugs at the top or bottom of the stairs. If you do have throw rugs, attach them to the floor with carpet tape. Make sure that you have a light switch at the top of the stairs and the bottom of the stairs. If you do not have them, ask someone to add them for you. What else can I do to help prevent falls? Wear shoes that: Do not have high heels. Have rubber bottoms. Are comfortable and fit you well. Are closed at the toe. Do not wear sandals. If you use a stepladder: Make sure that it is fully opened. Do not climb a  closed stepladder. Make sure that both sides of the stepladder are locked into place. Ask someone to hold it for you, if possible. Clearly mark and make sure that you can see: Any grab bars or handrails. First and last steps. Where the edge of each step is. Use tools that help you move around (mobility aids) if they are needed. These include: Canes. Walkers. Scooters. Crutches. Turn on the lights when you go into a dark area. Replace any light bulbs as soon as they burn out. Set up your furniture so you have a clear path. Avoid moving your furniture around. If any of your floors are uneven, fix them. If there are any pets around you, be aware of where they are. Review your medicines with your doctor. Some medicines can make you feel dizzy. This can increase your chance of falling. Ask your doctor what other things that you can do to help prevent falls. This information is not intended to replace advice given to you by your health care provider. Make sure you discuss any questions you have with your health care provider. Document Released: 04/10/2009 Document Revised: 11/20/2015 Document Reviewed: 07/19/2014 Elsevier Interactive Patient Education  2017 [employer].

## 2022-11-08 ENCOUNTER — Other Ambulatory Visit (HOSPITAL_BASED_OUTPATIENT_CLINIC_OR_DEPARTMENT_OTHER): Payer: Self-pay

## 2022-11-08 ENCOUNTER — Encounter: Payer: Self-pay | Admitting: Internal Medicine

## 2022-11-08 ENCOUNTER — Ambulatory Visit (INDEPENDENT_AMBULATORY_CARE_PROVIDER_SITE_OTHER): Payer: Medicare PPO | Admitting: Internal Medicine

## 2022-11-08 VITALS — BP 122/64 | HR 72 | Temp 97.7°F | Resp 18 | Ht 72.0 in | Wt 233.4 lb

## 2022-11-08 DIAGNOSIS — Z794 Long term (current) use of insulin: Secondary | ICD-10-CM | POA: Diagnosis not present

## 2022-11-08 DIAGNOSIS — Z0000 Encounter for general adult medical examination without abnormal findings: Secondary | ICD-10-CM

## 2022-11-08 DIAGNOSIS — E1142 Type 2 diabetes mellitus with diabetic polyneuropathy: Secondary | ICD-10-CM | POA: Diagnosis not present

## 2022-11-08 DIAGNOSIS — E785 Hyperlipidemia, unspecified: Secondary | ICD-10-CM | POA: Diagnosis not present

## 2022-11-08 DIAGNOSIS — E079 Disorder of thyroid, unspecified: Secondary | ICD-10-CM

## 2022-11-08 DIAGNOSIS — Z1211 Encounter for screening for malignant neoplasm of colon: Secondary | ICD-10-CM

## 2022-11-08 LAB — TSH: TSH: 0.08 u[IU]/mL — ABNORMAL LOW (ref 0.35–5.50)

## 2022-11-08 MED ORDER — SHINGRIX 50 MCG/0.5ML IM SUSR
0.5000 mL | Freq: Once | INTRAMUSCULAR | 0 refills | Status: AC
  Filled 2022-11-08: qty 0.5, 1d supply, fill #0

## 2022-11-08 NOTE — Patient Instructions (Addendum)
You are due for colonoscopy. You can reach to the gastroenterology office at [phone]  Vaccines I recommend: Tdap Covid booster if not done after 02-2022 Shingrix (shingles)#2 RSV vaccine Flu shot every fall     GO TO THE LAB : Get the blood work     GO TO THE FRONT DESK, PLEASE SCHEDULE YOUR APPOINTMENTS Come back for   a checkup in 6 months    Please bring [place] a copy of your Healthcare Power of Attorney for your chart.

## 2022-11-08 NOTE — Assessment & Plan Note (Signed)
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  Continue amlodipine, benazepril, HCTZ, Aldactone.  Last BMP okay. Hyperlipidemia: On atorvastatin.  Last LDL satisfactory History of thyroid cancer: LOV w/ Endo was few months ago, TSH was 22 (08/23/2022),  on Synthroid 175 mcg daily.  Pt not sure when he will have additional labs.  Plan: Check TSH, adjust meds if needed but still recommend to keep follow-up with Endo. BPH: Symptoms at baseline, sees urology. RTC 6 months

## 2022-11-08 NOTE — Assessment & Plan Note (Signed)
-  Td 2014  - PNM shot: 2015;  prevnar: 2015; PNM 20: 04-2021 - zostavax - 06-2016 - Vaccines I recommend: Tdap, Shingrix # 2, RSV, COVID booster if not done after 02-2022, flu shot every fall - PSAs per urology  -CCS: reports a Cscope at age 67, normal per patient; s/p cscope 06/2017 , next colonoscopy due.  Patient aware, plans to reach out to GI.  See AVS. -Lifestyle: Plans to increase physical activity -Labs: Reviewed , get a TSH -Healthcare POA: Recommend to bring a copy
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
## 2022-11-10 MED ORDER — LEVOTHYROXINE SODIUM 150 MCG PO TABS: 150.0000 ug | ORAL_TABLET | Freq: Every day | ORAL | 1 refills | Status: DC

## 2022-11-10 NOTE — Addendum Note (Signed)
Addended byConrad Opal D on: 11/10/2022 07:55 AM   Modules accepted: Orders

## 2022-12-07 ENCOUNTER — Ambulatory Visit (HOSPITAL_BASED_OUTPATIENT_CLINIC_OR_DEPARTMENT_OTHER): Payer: Medicare PPO | Admitting: Pulmonary Disease

## 2022-12-07 ENCOUNTER — Encounter (HOSPITAL_BASED_OUTPATIENT_CLINIC_OR_DEPARTMENT_OTHER): Payer: Self-pay | Admitting: Pulmonary Disease

## 2022-12-07 VITALS — BP 104/62 | HR 83 | Temp 97.8°F | Ht 72.0 in | Wt 231.6 lb

## 2022-12-07 DIAGNOSIS — G4733 Obstructive sleep apnea (adult) (pediatric): Secondary | ICD-10-CM

## 2022-12-07 DIAGNOSIS — J42 Unspecified chronic bronchitis: Secondary | ICD-10-CM

## 2022-12-07 MED ORDER — ALBUTEROL SULFATE HFA 108 (90 BASE) MCG/ACT IN AERS: 2.0000 | INHALATION_SPRAY | RESPIRATORY_TRACT | 2 refills | Status: DC | PRN

## 2022-12-07 NOTE — Patient Instructions (Addendum)
OSA --Continue CPAP 10 cm H20. Current device obtained in 2018 and working well --May need repeat sleep test in the future when he needs machine replacement  Chronic bronchitis Chest tightness/congestion --START Albuterol AS NEEDED for shortness of breath or wheezing --May need to step up if symptoms persist --May need pulmonary function tests in the future

## 2022-12-07 NOTE — Progress Notes (Signed)
Subjective:   PATIENT ID: Leonard [name] GENDER: male DOB: 1955/08/24, MRN: [id number]  Chief Complaint  Patient presents with   Consult    Consult.     Reason for Visit: New consult for OSA  [name] is a 67 year old male former smoker with OSA, HTN, DM2, allergic rhinitis, hx thyroid cancer who presents as a new patient. Previously followed by Dr. Vassie Loll at [facility] for OSA.  He has previously been told his apnea has improved however he cannot sleep without CPAP device as the snoring will awaken him. He sleeps better CPAP. Owns his current CPAP since 2018.  He reports upper chest pain/congestion bilaterally that occurs in the mornings that is worsened by productive cough. Denies wheezing. 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 Associated with ear congestion. Improves once he is up and during the day. Denies asthma history.   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 1 ppd x 30 years. 30 pack-years. Quit 2004 Environmental exposures: Radiation  I have personally reviewed patient's past medical/family/social history, allergies, current medications.  Past Medical History:  Diagnosis Date   ADD (attention deficit disorder)    Anxiety and depression    see's Dr. Evelene Croon   Bleeding nose    Right nostril severe bleeding   BPH (benign prostatic hyperplasia)    Bulging lumbar disc    2 in lower back   DM (diabetes mellitus) (HCC)    dx 13 yrs now   Fracture, rib 02/25/2018   Number 10 left side   History of kidney stones    HTN (hypertension)    controlled   Nephrolithiasis    has had 7 kidney stones   OCD (obsessive compulsive disorder)    Osteoarthritis    Other and unspecified hyperlipidemia    Papillary thyroid carcinoma (HCC) 2021   s/p thyroidectomy   Renal artery stenosis (HCC)    "some due to ageing"   Sleep apnea    "mild'-uses CPAP     Family History  Problem Relation Age of Onset   Bipolar disorder Mother     Depression Mother    Early death Mother    Heart failure Father        Deceased at 83-valvular heart disease   Prostate cancer Father    Cancer Father    Heart disease Father    Bipolar disorder Sister    Diabetes Maternal Grandfather    Hypertension Maternal Grandfather    Depression Daughter    Diabetes Other        Grandfather-Melitus   Prostate cancer Other        Uncles   OCD Son    Depression Sister    Colon cancer Neg Hx    CAD Neg Hx      Social History   Occupational History   Occupation: retired Runner, 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, 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: [employer]  Tobacco Use   Smoking status: Former    Packs/day: 0.50    Years: 25.00    Additional pack years: 0.00    Total pack years: 12.50    Types: Cigarettes    Quit date: 08/03/2003    Years since quitting: 19.3   Smokeless tobacco: Never   Tobacco comments:    Quit 2004  Vaping Use   Vaping Use: Never used  Substance and Sexual Activity   Alcohol use: No   Drug use: No  Sexual activity: Yes    Birth control/protection: None    Allergies  Allergen Reactions   Naproxen Anxiety, Shortness Of Breath and Palpitations   Niacin Rash     Outpatient Medications Prior to Visit  Medication Sig Dispense Refill   allopurinol (ZYLOPRIM) 300 MG tablet Take 300 mg by mouth daily.     ALPRAZolam (XANAX) 1 MG tablet Take 1 tablet (1 mg total) by mouth 3 (three) times daily as needed. But max of 2.5 mg total per day 75 tablet 0   amLODipine-benazepril (LOTREL) 10-40 MG capsule Take 1 capsule by mouth at bedtime. 90 capsule 1   amphetamine-dextroamphetamine (ADDERALL) 10 MG tablet Take 1 tablet (10 mg total) by mouth with breakfast, with lunch, and with evening meal. (Patient taking differently: Take 5 mg by mouth with breakfast, with lunch, and with evening meal.) 90 tablet 0   aspirin 81 MG tablet Take 81 mg by mouth at bedtime.      atorvastatin (LIPITOR) 20 MG tablet TAKE ONE TABLET BY  MOUTH DAILY 90 tablet 1   Blood Glucose Monitoring Suppl (ONE TOUCH ULTRA SYSTEM KIT) w/Device KIT 1 kit by Does not apply route once. 1 each 0   cholecalciferol (VITAMIN D3) 25 MCG (1000 UNIT) tablet Take 1,000 Units by mouth daily.     cloNIDine (CATAPRES) 0.1 MG tablet TAKE 1 TABLET (0.1 MG TOTAL) BY MOUTH AT BEDTIME 30 tablet 0   Cyanocobalamin (VITAMIN B 12 PO) Take by mouth.     escitalopram (LEXAPRO) 20 MG tablet Take 2 tablets (40 mg total) by mouth daily. 60 tablet 1   hydrochlorothiazide (HYDRODIURIL) 25 MG tablet Take 25 mg by mouth daily before breakfast.     insulin degludec (TRESIBA FLEXTOUCH) 200 UNIT/ML FlexTouch Pen Inject 44 Units into the skin daily.     Insulin Pen Needle (BD PEN NEEDLE NANO U/F) 32G X 4 MM MISC TO USE WITH INSULIN 100 each 0   JARDIANCE 25 MG TABS tablet Take 1 tablet by mouth daily.     Lancets (ONETOUCH ULTRASOFT) lancets Check blood sugar no more than twice daily 100 each 12   levothyroxine (SYNTHROID) 150 MCG tablet Take 1 tablet (150 mcg total) by mouth daily before breakfast. 30 tablet 1   MAGNESIUM GLYCINATE PO Take 500 mg by mouth daily.     metFORMIN (GLUCOPHAGE) 1000 MG tablet Take 0.5 tablets (500 mg total) by mouth 2 (two) times daily with a meal.     MOUNJARO 10 MG/0.5ML Pen Inject 10 mg into the skin once a week.     Multiple Vitamin (MULTI-VITAMIN) tablet Take 1 tablet by mouth daily.     Omega-3 Fatty Acids (FISH OIL) 1200 MG CPDR Take by mouth.     ONETOUCH ULTRA test strip CHECK BLOOD SUGAR NO MORE THAN TWICE DAILY 100 each 1   potassium citrate (UROCIT-K) 10 MEQ (1080 MG) SR tablet Take 1 tablet by mouth 2 (two) times daily with a meal.     spironolactone (ALDACTONE) 25 MG tablet Take 25 mg by mouth daily.     tamsulosin (FLOMAX) 0.4 MG CAPS Take 0.4 mg by mouth at bedtime.     traZODone (DESYREL) 50 MG tablet Take 25 mg by mouth 3 (three) times daily. As needed for anxiety     No facility-administered medications prior to visit.     Review of Systems  Constitutional:  Negative for chills, diaphoresis, fever, malaise/fatigue and weight loss.  HENT:  Negative for congestion.   Respiratory:  Negative for cough, hemoptysis, sputum production, shortness of breath and wheezing.   Cardiovascular:  Positive for chest pain. Negative for palpitations and leg swelling.     Objective:   Vitals:   12/07/22 0936  BP: 104/62  Pulse: 83  Temp: 97.8 F (36.6 C)  TempSrc: Oral  SpO2: 96%  Weight: 231 lb 9.6 oz (105.1 kg)  Height: 6' (1.829 m)   SpO2: 96 % O2 Device: None (Room air)  Physical Exam: General: Well-appearing, no acute distress HENT: [place], AT Eyes: EOMI, no scleral icterus Respiratory: Clear to auscultation bilaterally.  No crackles, wheezing or rales Cardiovascular: RRR, -M/R/G, no JVD Extremities:-Edema,-tenderness Neuro: AAO x4, CNII-XII grossly intact Psych: Normal mood, normal affect  Data Reviewed:  Imaging: CT Coronary 09/23/22 - Visualized lung parenchyma with no pulmonary nodules, masses, infiltrate, effusion or pneumothorax.  PFT: None on file  Labs: CBC    Component Value Date/Time   WBC 9.4 06/15/2022 1411   RBC 4.89 06/15/2022 1411   HGB 15.1 06/15/2022 1411   HCT 44.1 06/15/2022 1411   PLT 370.0 06/15/2022 1411   MCV 90.3 06/15/2022 1411   MCH 29.7 03/07/2018 1052   MCHC 34.2 06/15/2022 1411   RDW 14.3 06/15/2022 1411   LYMPHSABS 2.1 06/15/2022 1411   MONOABS 0.8 06/15/2022 1411   EOSABS 0.3 06/15/2022 1411   BASOSABS 0.0 06/15/2022 1411   Sleep PSG 01/04/07 (wt 275) recorded 232 minutes of sleep with only 24.5 minutes of REM sleep. The sleep efficiency of 65%. Although the oral AHI was 8 per hour, but REM related AHI was31.8. The lowest desaturation was 87%. Longest hypopnea was 43 seconds. His lowest heart rate was 52 beats per minute during non-REM sleep. No PLMs or arrhythmias were noted.   CPAP 11/06/22-12/05/22 30/30 days (100%) >4 hours 100% CPAP 10 cm H20 AHI 0.2       Assessment & Plan:   Discussion: 67 year old male former smoker with OSA, HTN, DM2, allergic rhinitis, hx thyroid cancer who presents as a new patient. Reviewed history and notes. Chest pain not likely cardiac. Will initially manage with PRN bronchodilators. OSA well controlled and compliance report reviewed. Patient uses NIV for more than four hours nightly for at least 70% of nights during the last month of usage. The patient has been using and benefiting from PAP use and will continue to benefit from therapy.    OSA --Continue CPAP 10 cm H20. Current device obtained in 2018 and working well --May need repeat sleep test in the future when he needs machine replacement  Chest tightness/congestions - new problem. May be related to possible asthma, bronchitis --START Albuterol AS NEEDED for shortness of breath or wheezing --May need to step up if symptoms persist --May need pulmonary function tests in the future  Health Maintenance Immunization History  Administered Date(s) Administered   Fluad Quad(high Dose 65+) 05/06/2020, 05/20/2021   Hepatitis A 11/11/2005   IPV 11/11/2005   Influenza Split 07/12/2011, 04/06/2012, 04/28/2022   Influenza, High Dose Seasonal PF 04/12/2017   Influenza,inj,Quad PF,6+ Mos 06/07/2013, 05/16/2014, 08/11/2015, 07/09/2016, 05/22/2018, 03/12/2019   Moderna Covid-19 Vaccine Bivalent Booster 75yrs & up 06/12/2021   Moderna Sars-Covid-2 Vaccination 08/24/2019, 09/21/2019, 11/13/2020   PFIZER(Purple Top)SARS-COV-2 Vaccination 06/06/2020   PNEUMOCOCCAL CONJUGATE-20 05/20/2021   Pneumococcal Conjugate-13 05/16/2014   Pneumococcal Polysaccharide-23 07/19/2013   Tdap 06/07/2013   Typhoid Live 11/11/2005   Zoster Recombinat (Shingrix) 02/08/2022, 11/08/2022   Zoster, Live 07/09/2016   CT Lung Screen - not qualified  No orders of the defined types were placed in this encounter.  Meds ordered this encounter  Medications   albuterol (VENTOLIN HFA) 108  (90 Base) MCG/ACT inhaler    Sig: Inhale 2 puffs into the lungs every 4 (four) hours as needed for wheezing or shortness of breath (chest tightness).    Dispense:  8 g    Refill:  2    Ok to substitute for insurance preferred    Return in about 3 months (around 03/09/2023).  I have spent a total time of 45-minutes on the day of the appointment reviewing prior documentation, coordinating care and discussing medical diagnosis and plan with the patient/family. Imaging, labs and tests included in this note have been reviewed and interpreted independently by me.  Essynce Munsch Mechele Collin, MD [facility] 12/07/2022 1:04 PM  Office Number [phone]

## 2022-12-08 ENCOUNTER — Ambulatory Visit: Payer: Medicare PPO | Admitting: Internal Medicine

## 2022-12-21 DIAGNOSIS — L570 Actinic keratosis: Secondary | ICD-10-CM | POA: Diagnosis not present

## 2022-12-21 DIAGNOSIS — B351 Tinea unguium: Secondary | ICD-10-CM | POA: Diagnosis not present

## 2022-12-21 DIAGNOSIS — L249 Irritant contact dermatitis, unspecified cause: Secondary | ICD-10-CM | POA: Diagnosis not present

## 2022-12-27 ENCOUNTER — Telehealth: Payer: Self-pay

## 2022-12-27 NOTE — Telephone Encounter (Signed)
FYI- scheduled him an appt w/ you at 8am tomorrow morning, instructed him not to take any BP meds until he see's you. Instructed him if he becomes worse (presyncope, syncope, bp becomes even lower to go to ED).

## 2022-12-27 NOTE — Telephone Encounter (Signed)
Initial Comment Caller has a blood pressure reading of 97/54, he is weak, dizzy, and tired, had issues with his stomach. Translation No Nurse Assessment Nurse: Tennis Ship, RN, Clarisse Gouge Date/Time (Eastern Time): 12/27/2022 12:52:57 PM Confirm and document reason for call. If symptomatic, describe symptoms. ---Caller states bp-97/54 feels weak and tired, stomach ache with diarrhea Thursday night through Sunday, drinking lots of water. Does the patient have any new or worsening symptoms? ---Yes Will a triage be completed? ---Yes Related visit to physician within the last 2 weeks? ---No Does the PT have any chronic conditions? (i.e. diabetes, asthma, this includes High risk factors for pregnancy, etc.) ---Yes List chronic conditions. ---DM, HTN, Is this a behavioral health or substance abuse call? ---No Guidelines Guideline Title Affirmed Question Affirmed Notes Nurse Date/Time (Eastern Time) Weakness (Generalized) and Fatigue [1] MODERATE weakness (i.e., interferes with work, school, normal activities) AND [2] persists > 3 days Lorenza Chick 12/27/2022 1:01:54 PM PLEASE NOTE: All timestamps contained within this report are represented as [place] Standard Time. CONFIDENTIALTY NOTICE: This fax transmission is intended only for the addressee. It contains information that is legally privileged, confidential or otherwise protected from use or disclosure. If you are not the intended recipient, you are strictly prohibited from reviewing, disclosing, copying using or disseminating any of this information or taking any action in reliance on or regarding this information. If you have received this fax in error, please notify us immediately by telephone so that we can arrange for its return to [place]. Phone: [phone], Toll-Free: [phone], Fax: [phone] Page: 2 of 2 Call Id: [id number] Disp. Time Lamount Cohen Time) Disposition Final User 12/27/2022 1:15:39 PM See PCP within 24 Hours Yes Tennis Ship  RN, Clarisse Gouge Final Disposition 12/27/2022 1:15:39 PM See PCP within 24 Hours Yes Tennis Ship, RN, Clarisse Gouge Caller Disagree/Comply Comply Caller Understands Yes PreDisposition Call Doctor Care Advice Given Per Guideline SEE PCP WITHIN 24 HOURS: CALL BACK IF: * You become worse CARE ADVICE given per Weakness and Fatigue (Adult) guideline. Comments User: Shanon Rosser, RN Date/Time Lamount Cohen Time): 12/27/2022 12:57:26 PM Normally Sbp is 110-120's normally User: Shanon Rosser, RN Date/Time Lamount Cohen Time): 12/27/2022 12:59:22 PM thyroid cancer, thyroid removed User: Shanon Rosser, RN Date/Time Lamount Cohen Time): 12/27/2022 1:04:10 PM current BP-104/63, HR-97 Referrals REFERRED TO PCP OFFICE

## 2022-12-28 ENCOUNTER — Ambulatory Visit: Payer: Medicare PPO | Admitting: Family Medicine

## 2022-12-28 ENCOUNTER — Encounter: Payer: Self-pay | Admitting: Family Medicine

## 2022-12-28 VITALS — BP 112/68 | HR 72 | Temp 97.8°F | Ht 72.0 in | Wt 226.4 lb

## 2022-12-28 DIAGNOSIS — I10 Essential (primary) hypertension: Secondary | ICD-10-CM

## 2022-12-28 MED ORDER — LISINOPRIL-HYDROCHLOROTHIAZIDE 20-25 MG PO TABS: 1.0000 | ORAL_TABLET | Freq: Every day | ORAL | 3 refills | Status: DC

## 2022-12-28 NOTE — Progress Notes (Signed)
Chief Complaint  Patient presents with   Hypotension    Weakness and dizziness Diarrhea since Thursday, Sunday last day for diarrhea and now gone. Monday 12/27/22 better stomach wise, but low BP.    Subjective KENTEN [name] is a 67 y.o. male who presents for hypertension follow up. He does monitor home blood pressures. Blood pressures ranging from 110-120's/60-70's on average. He is compliant with medications-Lotrel 10-40 mg/d, hydrochlorothiazide 25 mg/d, Aldactone 25 mg/d. Patient has these side effects of medication: none He is adhering to a healthy diet overall. Current exercise: active in yard/landscaping No CP or SOB.  Had GI bug last week, steadily getting better. Yesterday BP was 95/55, he felt weak/lightheaded. Has not taken meds since. Questions whether he needs all his BP meds as he was very uncontrolled prior to Aldactone (Conn's vs RAS?)   Past Medical History:  Diagnosis Date   ADD (attention deficit disorder)    Anxiety and depression    see's Dr. Evelene Croon   Bleeding nose    Right nostril severe bleeding   BPH (benign prostatic hyperplasia)    Bulging lumbar disc    2 in lower back   DM (diabetes mellitus) (HCC)    dx 13 yrs now   Fracture, rib 02/25/2018   Number 10 left side   History of kidney stones    HTN (hypertension)    controlled   Nephrolithiasis    has had 7 kidney stones   OCD (obsessive compulsive disorder)    Osteoarthritis    Other and unspecified hyperlipidemia    Papillary thyroid carcinoma (HCC) 2021   s/p thyroidectomy   Renal artery stenosis (HCC)    "some due to ageing"   Sleep apnea    "mild'-uses CPAP    Exam BP 112/68 (BP Location: Left Arm, Patient Position: Sitting, Cuff Size: Normal)   Pulse 72   Temp 97.8 F (36.6 C) (Oral)   Ht 6' (1.829 m)   Wt 226 lb 6 oz (102.7 kg)   SpO2 98%   BMI 30.70 kg/m  General:  well developed, well nourished, in no apparent distress Heart: RRR, no bruits, no LE edema Lungs: clear to  auscultation, no accessory muscle use Psych: well oriented with normal range of affect and appropriate judgment/insight  Essential hypertension  Stop Lotrel and hydrochlorothiazide. Start Zestoretic 20-25 mg/d to protect kidneys in setting of DM and help w kidney stones. Cont Aldactone. Cont monitoring BP. Add back above when SBP >130. Stay hydrated. Counseled on diet and exercise. F/u in 3 weeks to reck above. The patient voiced understanding and agreement to the plan.  Jilda Roche [place], DO 12/28/22  9:08 AM

## 2022-12-28 NOTE — Patient Instructions (Addendum)
We are dropping the amlodipine-benazepril and combining your hydrochlorothiazide with lisinopril. Let me know if there are issues.   Monitor blood pressure at home. If >130 on the top, then resume your blood pressure medication.   Please contact the GI team at: [phone]  Let us know if you need anything.

## 2023-01-20 ENCOUNTER — Encounter: Payer: Self-pay | Admitting: Internal Medicine

## 2023-01-25 ENCOUNTER — Encounter: Payer: Self-pay | Admitting: Gastroenterology

## 2023-01-25 ENCOUNTER — Ambulatory Visit: Payer: Medicare PPO | Admitting: Internal Medicine

## 2023-01-26 ENCOUNTER — Other Ambulatory Visit (INDEPENDENT_AMBULATORY_CARE_PROVIDER_SITE_OTHER): Payer: Medicare PPO

## 2023-01-26 DIAGNOSIS — E079 Disorder of thyroid, unspecified: Secondary | ICD-10-CM | POA: Diagnosis not present

## 2023-01-26 LAB — TSH: TSH: 2.54 u[IU]/mL (ref 0.35–5.50)

## 2023-01-27 ENCOUNTER — Encounter: Payer: Self-pay | Admitting: Internal Medicine

## 2023-01-31 ENCOUNTER — Ambulatory Visit: Payer: Medicare PPO | Admitting: Internal Medicine

## 2023-01-31 ENCOUNTER — Encounter: Payer: Self-pay | Admitting: Internal Medicine

## 2023-01-31 ENCOUNTER — Other Ambulatory Visit (HOSPITAL_BASED_OUTPATIENT_CLINIC_OR_DEPARTMENT_OTHER): Payer: Self-pay

## 2023-01-31 VITALS — BP 108/70 | HR 76 | Temp 97.7°F | Ht 72.0 in | Wt 219.6 lb

## 2023-01-31 DIAGNOSIS — E1142 Type 2 diabetes mellitus with diabetic polyneuropathy: Secondary | ICD-10-CM

## 2023-01-31 DIAGNOSIS — I10 Essential (primary) hypertension: Secondary | ICD-10-CM | POA: Diagnosis not present

## 2023-01-31 DIAGNOSIS — Z794 Long term (current) use of insulin: Secondary | ICD-10-CM | POA: Diagnosis not present

## 2023-01-31 DIAGNOSIS — R7989 Other specified abnormal findings of blood chemistry: Secondary | ICD-10-CM

## 2023-01-31 LAB — BASIC METABOLIC PANEL
BUN: 38 mg/dL — ABNORMAL HIGH (ref 6–23)
CO2: 23 mEq/L (ref 19–32)
Calcium: 10 mg/dL (ref 8.4–10.5)
Chloride: 101 mEq/L (ref 96–112)
Creatinine, Ser: 2.15 mg/dL — ABNORMAL HIGH (ref 0.40–1.50)
GFR: 31.2 mL/min — ABNORMAL LOW (ref >60.00)
Glucose, Bld: 92 mg/dL (ref 70–99)
Potassium: 5.1 mEq/L (ref 3.5–5.1)
Sodium: 136 mEq/L (ref 135–145)

## 2023-01-31 LAB — CBC WITH DIFFERENTIAL/PLATELET
Basophils Absolute: 0 10*3/uL (ref 0.0–0.1)
Basophils Relative: 0.6 % (ref 0.0–3.0)
Eosinophils Absolute: 0.5 10*3/uL (ref 0.0–0.7)
Eosinophils Relative: 5.4 % — ABNORMAL HIGH (ref 0.0–5.0)
HCT: 46.7 % (ref 39.0–52.0)
Hemoglobin: 15.2 g/dL (ref 13.0–17.0)
Lymphocytes Relative: 28.2 % (ref 12.0–46.0)
Lymphs Abs: 2.5 10*3/uL (ref 0.7–4.0)
MCHC: 32.5 g/dL (ref 30.0–36.0)
MCV: 91.7 fl (ref 78.0–100.0)
Monocytes Absolute: 0.8 10*3/uL (ref 0.1–1.0)
Monocytes Relative: 9.6 % (ref 3.0–12.0)
Neutro Abs: 4.9 10*3/uL (ref 1.4–7.7)
Neutrophils Relative %: 56.2 % (ref 43.0–77.0)
Platelets: 344 10*3/uL (ref 150.0–400.0)
RBC: 5.09 Mil/uL (ref 4.22–5.81)
RDW: 14.7 % (ref 11.5–15.5)
WBC: 8.7 10*3/uL (ref 4.0–10.5)

## 2023-01-31 MED ORDER — RSVPREF3 VAC RECOMB ADJUVANTED 120 MCG/0.5ML IM SUSR
0.5000 mL | Freq: Once | INTRAMUSCULAR | 0 refills | Status: AC
  Filled 2023-01-31: qty 0.5, 1d supply, fill #0

## 2023-01-31 MED ORDER — AMLODIPINE BESYLATE 5 MG PO TABS: 5.0000 mg | ORAL_TABLET | Freq: Every day | ORAL | 6 refills | Status: DC
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
## 2023-01-31 NOTE — Patient Instructions (Addendum)
Check the  blood pressure regularly BP GOAL is between 110/65 and  135/85. If it is consistently higher or lower, let me know   Vaccines: Tdap RSV  Covid booster   flu shot (fall)   GO TO THE LAB : Get the blood work     GO TO THE FRONT DESK, PLEASE SCHEDULE YOUR APPOINTMENTS Come back for  a check up in 4 months

## 2023-01-31 NOTE — Assessment & Plan Note (Signed)
HTN: Saw Dr. Carmelia Roller 12/28/2022, BP was low in the context of possibly dehydration. Amlodipine benazepril  were d/c. Started lisinopril and continue hydrochlorothiazide and spironolactone. Ambulatory BPs are great in the 110/70. Check a BMP and CBC. Addendum:   K+ 5.1, creat increased 1.0 >> 2.1 Will top lisinopril and hydrochlorothiazide. Continue spironolactone. Start amlodipine 5 mg 1 tablet daily starting tomorrow as long as it BP is more than 115 BMP next week Good hydration.  Works out over multiple hours a day. DM: Per Endo, weight loss noted, patient thinks is because Mounjaro dose was adjusted and he is eating less. OSA: Saw pulmonary 12/07/2022.  Continue with CPAP. Vaccine advice provided. RTC 4 months

## 2023-02-01 ENCOUNTER — Other Ambulatory Visit: Payer: Self-pay | Admitting: Internal Medicine

## 2023-02-01 ENCOUNTER — Other Ambulatory Visit: Payer: Self-pay

## 2023-02-01 DIAGNOSIS — I10 Essential (primary) hypertension: Secondary | ICD-10-CM

## 2023-02-17 ENCOUNTER — Encounter (HOSPITAL_BASED_OUTPATIENT_CLINIC_OR_DEPARTMENT_OTHER): Payer: Self-pay | Admitting: Emergency Medicine

## 2023-02-17 ENCOUNTER — Other Ambulatory Visit: Payer: Self-pay

## 2023-02-17 ENCOUNTER — Other Ambulatory Visit (HOSPITAL_BASED_OUTPATIENT_CLINIC_OR_DEPARTMENT_OTHER): Payer: Self-pay

## 2023-02-17 ENCOUNTER — Emergency Department (HOSPITAL_BASED_OUTPATIENT_CLINIC_OR_DEPARTMENT_OTHER)
Admission: EM | Admit: 2023-02-17 | Discharge: 2023-02-17 | Disposition: A | Discharge status: Home / Self Care | Payer: Medicare PPO | Attending: Emergency Medicine | Admitting: Emergency Medicine

## 2023-02-17 DIAGNOSIS — Z7982 Long term (current) use of aspirin: Secondary | ICD-10-CM | POA: Diagnosis not present

## 2023-02-17 DIAGNOSIS — Z7984 Long term (current) use of oral hypoglycemic drugs: Secondary | ICD-10-CM | POA: Diagnosis not present

## 2023-02-17 DIAGNOSIS — T7840XA Allergy, unspecified, initial encounter: Secondary | ICD-10-CM | POA: Diagnosis not present

## 2023-02-17 DIAGNOSIS — E119 Type 2 diabetes mellitus without complications: Secondary | ICD-10-CM | POA: Insufficient documentation

## 2023-02-17 DIAGNOSIS — Z79899 Other long term (current) drug therapy: Secondary | ICD-10-CM | POA: Insufficient documentation

## 2023-02-17 DIAGNOSIS — Z794 Long term (current) use of insulin: Secondary | ICD-10-CM | POA: Insufficient documentation

## 2023-02-17 DIAGNOSIS — I10 Essential (primary) hypertension: Secondary | ICD-10-CM | POA: Insufficient documentation

## 2023-02-17 MED ORDER — FAMOTIDINE 20 MG PO TABS
20.0000 mg | ORAL_TABLET | Freq: Two times a day (BID) | ORAL | 0 refills | Status: DC
  Filled 2023-02-17: qty 10, 5d supply, fill #0

## 2023-02-17 MED ORDER — DIPHENHYDRAMINE HCL 50 MG/ML IJ SOLN
25.0000 mg | Freq: Once | INTRAMUSCULAR | Status: AC
  Administered 2023-02-17: 25 mg via INTRAVENOUS
  Filled 2023-02-17: qty 1

## 2023-02-17 MED ORDER — FAMOTIDINE IN NACL 20-0.9 MG/50ML-% IV SOLN
20.0000 mg | Freq: Once | INTRAVENOUS | Status: AC
  Administered 2023-02-17: 20 mg via INTRAVENOUS
  Filled 2023-02-17: qty 50

## 2023-02-17 MED ORDER — PREDNISONE 20 MG PO TABS
60.0000 mg | ORAL_TABLET | Freq: Every day | ORAL | 0 refills | Status: AC
  Filled 2023-02-17: qty 15, 5d supply, fill #0

## 2023-02-17 MED ORDER — EPINEPHRINE 0.3 MG/0.3ML IJ SOAJ
0.3000 mg | INTRAMUSCULAR | 0 refills | Status: DC | PRN
  Filled 2023-02-17: qty 2, 30d supply, fill #0

## 2023-02-17 MED ORDER — DEXAMETHASONE SODIUM PHOSPHATE 10 MG/ML IJ SOLN
10.0000 mg | Freq: Once | INTRAMUSCULAR | Status: AC
  Administered 2023-02-17: 10 mg via INTRAVENOUS
  Filled 2023-02-17: qty 1

## 2023-02-17 MED ORDER — FAMOTIDINE 20 MG PO TABS: 20.0000 mg | ORAL_TABLET | Freq: Two times a day (BID) | ORAL | 0 refills | Status: DC

## 2023-02-17 MED ORDER — CETIRIZINE HCL 10 MG PO TABS
10.0000 mg | ORAL_TABLET | Freq: Two times a day (BID) | ORAL | 0 refills | Status: DC
  Filled 2023-02-17: qty 10, 5d supply, fill #0

## 2023-02-17 NOTE — ED Provider Notes (Signed)
Pt presents with acute allergic reaction.  Sx started around 2pm.  Pt had been out working with his tomato plants.  Started noticing an itchy rash under the arms.  Pt went to see his wife who noticed his lip was swelling.  Pt came to the ED for evaluation  Exam HEENT:  edema noted upper lip, no tongue swelling, no uvula edema Lungs CTA Car RRR Skin: urticaria rash noted axillary region  Will start steroids, antihistamines.  Monitor closely, consider epi if not responding, any worsening symptoms.   Linwood Dibbles, MD 02/17/23 [phone]

## 2023-02-17 NOTE — ED Provider Notes (Signed)
[place] [facility] Provider Note   CSN: [id number] Arrival date & time: 02/17/23  1444     History  Chief Complaint  Patient presents with   Allergic Reaction    DAIN JOSH is a 67 y.o. male.  EBERT BLOODSWORTH is a 67 y.o. male with a history of diabetes, hypertension, OCD, who presents to the emergency department for evaluation of possible allergic reaction.  Patient reports symptoms started around 2 PM he noticed an itchy red rash over his underarms and legs.  He also noticed that he started to have some face and lip swelling and his wife immediately brought him in for evaluation.  He reports just prior to the symptoms beginning he had been out in his garden working on [employer] did not note any bug bites or stings.  He reports prior to that he had been up in his attic and opened a box that had been up there for several years does not know if he could have been exposed to something here.  He denies any new medications, patient does take an ACE inhibitor, no recent soaps detergents or other household products, no new foods.  No prior history of anaphylaxis.  No other aggravating or alleviating factors.  No meds prior to arrival.  The history is provided by the patient and medical records.  Allergic Reaction Presenting symptoms: rash   Presenting symptoms: no difficulty swallowing and no wheezing        Home Medications Prior to Admission medications   Medication Sig Start Date End Date Taking? Authorizing Provider  albuterol (VENTOLIN HFA) 108 (90 Base) MCG/ACT inhaler Inhale 2 puffs into the lungs every 4 (four) hours as needed for wheezing or shortness of breath (chest tightness). 12/07/22   Luciano Cutter, MD  allopurinol (ZYLOPRIM) 300 MG tablet Take 300 mg by mouth daily.    [provider]  ALPRAZolam Prudy Feeler) 1 MG tablet Take 1 tablet (1 mg total) by mouth 3 (three) times daily as needed. But max of 2.5 mg  total per day 04/27/22   Melony Overly T, PA-C  amLODipine (NORVASC) 5 MG tablet Take 1 tablet (5 mg total) by mouth daily. 01/31/23   Wanda Plump, MD  amphetamine-dextroamphetamine (ADDERALL) 10 MG tablet Take 1 tablet (10 mg total) by mouth with breakfast, with lunch, and with evening meal. Patient taking differently: Take 5 mg by mouth with breakfast, with lunch, and with evening meal. 04/29/22   Hurst, Glade Nurse, PA-C  aspirin 81 MG tablet Take 81 mg by mouth at bedtime.     [provider]  atorvastatin (LIPITOR) 20 MG tablet TAKE ONE TABLET BY MOUTH DAILY 06/08/21   Wanda Plump, MD  Blood Glucose Monitoring Suppl (ONE TOUCH ULTRA SYSTEM KIT) w/Device KIT 1 kit by Does not apply route once. 09/11/15   Wanda Plump, MD  cholecalciferol (VITAMIN D3) 25 MCG (1000 UNIT) tablet Take 1,000 Units by mouth daily.    [provider]  Cyanocobalamin (VITAMIN B 12 PO) Take by mouth.    [provider]  escitalopram (LEXAPRO) 20 MG tablet Take 2 tablets (40 mg total) by mouth daily. Patient taking differently: Take 20 mg by mouth daily. 05/31/22   Melony Overly T, PA-C  insulin degludec (TRESIBA FLEXTOUCH) 200 UNIT/ML FlexTouch Pen Inject 44 Units into the skin daily. 03/17/20   [provider]  Insulin Pen Needle (BD PEN NEEDLE NANO U/F) 32G X 4 MM  MISC TO USE WITH INSULIN 01/31/20   Mliss Sax, MD  JARDIANCE 25 MG TABS tablet Take 1 tablet by mouth daily. 10/31/18   [provider]  Lancets Letta Pate ULTRASOFT) lancets Check blood sugar no more than twice daily 09/11/15   Wanda Plump, MD  levothyroxine (SYNTHROID) 150 MCG tablet TAKE ONE TABLET BY MOUTH DAILY BEFORE BREAKFAST 02/01/23   Wanda Plump, MD  MAGNESIUM GLYCINATE PO Take 500 mg by mouth daily.    [provider]  metFORMIN (GLUCOPHAGE) 1000 MG tablet Take 0.5 tablets (500 mg total) by mouth 2 (two) times daily with a meal. 03/06/20   Paz, Nolon Rod, MD  [facility] 10 MG/0.5ML Pen Inject 10 mg into  the skin once a week.    [provider]  Multiple Vitamin (MULTI-VITAMIN) tablet Take 1 tablet by mouth daily.    [provider]  Omega-3 Fatty Acids (FISH OIL) 1200 MG CPDR Take by mouth.    [provider]  [facility] ULTRA test strip CHECK BLOOD SUGAR NO MORE THAN TWICE DAILY 06/07/19   Mliss Sax, MD  potassium citrate (UROCIT-K) 10 MEQ (1080 MG) SR tablet Take 1 tablet by mouth 2 (two) times daily with a meal. 05/25/21   [provider]  spironolactone (ALDACTONE) 25 MG tablet Take 25 mg by mouth daily. 09/16/21   [provider]  tamsulosin (FLOMAX) 0.4 MG CAPS Take 0.4 mg by mouth at bedtime.    [provider]      Allergies    Naproxen and Niacin    Review of Systems   Review of Systems  Constitutional:  Negative for chills and fever.  HENT:  Positive for facial swelling. Negative for trouble swallowing.   Respiratory:  Negative for shortness of breath and wheezing.   Cardiovascular:  Negative for chest pain.  Skin:  Positive for rash.  All other systems reviewed and are negative.   Physical Exam Updated Vital Signs BP (!) 153/106   Pulse 99   Resp 20   SpO2 97%  Physical Exam Vitals and nursing note reviewed.  Constitutional:      General: He is not in acute distress.    Appearance: Normal appearance. He is well-developed and normal weight. He is not ill-appearing or diaphoretic.  HENT:     Head: Normocephalic and atraumatic.     Mouth/Throat:     Comments: Edema noted to the upper lip, no tongue swelling or uvula swelling, posterior oropharynx is clear, patient tolerating secretions without difficulty, normal phonation Eyes:     General:        Right eye: No discharge.        Left eye: No discharge.     Pupils: Pupils are equal, round, and reactive to light.  Neck:     Comments: No stridor Cardiovascular:     Rate and Rhythm: Normal rate and regular rhythm.     Pulses: Normal pulses.     Heart  sounds: Normal heart sounds. No murmur heard.    No friction rub. No gallop.  Pulmonary:     Effort: Pulmonary effort is normal. No respiratory distress.     Breath sounds: Normal breath sounds. No wheezing or rales.     Comments: Respirations equal and unlabored, patient able to speak in full sentences, lungs clear to auscultation bilaterally  Abdominal:     General: Bowel sounds are normal. There is no distension.     Palpations: Abdomen is soft. There is  no mass.     Tenderness: There is no abdominal tenderness. There is no guarding.     Comments: Abdomen soft, nondistended, nontender to palpation in all quadrants without guarding or peritoneal signs  Musculoskeletal:        General: No deformity.     Cervical back: Neck supple.  Skin:    General: Skin is warm and dry.     Capillary Refill: Capillary refill takes less than 2 seconds.     Comments: Urticarial rash noted to bilateral axilla with a few spots extending down both arms, some urticaria noted on the upper thighs as well.  Neurological:     Mental Status: He is alert and oriented to person, place, and time.     Coordination: Coordination normal.     Comments: Speech is clear, able to follow commands Moves extremities without ataxia, coordination intact  Psychiatric:        Mood and Affect: Mood normal.        Behavior: Behavior normal.     ED Results / Procedures / Treatments   Labs (all labs ordered are listed, but only abnormal results are displayed) Labs Reviewed - No data to display  EKG None  Radiology No results found.  Procedures Procedures    Medications Ordered in ED Medications  diphenhydrAMINE (BENADRYL) injection 25 mg (has no administration in time range)  dexamethasone (DECADRON) injection 10 mg (has no administration in time range)  famotidine (PEPCID) IVPB 20 mg premix (has no administration in time range)    ED Course/ Medical Decision Making/ A&P                                  Medical Decision Making Risk OTC drugs. Prescription drug management.   67 year old male presents with urticaria and facial swelling that began 30 minutes to an hour prior to arrival.  Hives close suspicion is for allergic reaction, did consider ACE inhibitor angioedema as patient is on benazepril but given urticaria present as well feel this is much less likely.  Will treat with steroids Benadryl and Pepcid and monitor closely if not responding will give epinephrine.  Patient reevaluated 15 minutes after receiving steroids and antihistamines and already feels the symptoms are starting to improve no worsening of facial edema.  Patient monitored in the emergency department for almost 3 hours, symptoms continue to improve no worsening in edema no epinephrine administered.  At this time feel patient is appropriate for discharge home.  Unclear trigger for allergic reaction today.  Will give follow-up with allergy specialist to consider allergy testing.  Question for insect or outdoor exposure as the trigger.  Will prescribe 5-day course of steroids and antihistamines encourage patient to monitor blood sugar closely while on steroids.  Patient prescribed an EpiPen as well provided instruction on how and when to use this.        Final Clinical Impression(s) / ED Diagnoses Final diagnoses:  None    Rx / DC Orders ED Discharge Orders     None         Legrand Rams 02/23/23 1754    Linwood Dibbles, MD 03/08/23 [phone]

## 2023-02-17 NOTE — ED Notes (Signed)
ED Provider at bedside. 

## 2023-02-17 NOTE — ED Triage Notes (Addendum)
States at 1430 this afternoon, started having swelling to upper lip and hives, ? cause

## 2023-02-17 NOTE — Discharge Instructions (Addendum)
We are not sure exactly which you may have reacted to please follow-up with the allergy specialist to discuss allergy testing, in the meantime would avoid going in your attic.  For the next 5 days please take prescribed steroids, Zyrtec and Pepcid to help continue to treat allergic reaction.  I have also prescribed you an EpiPen which you should keep with you in case of future allergic reactions.  If you have worsening facial swelling, difficulty breathing or other new or concerning symptoms please return to the emergency department.

## 2023-02-21 ENCOUNTER — Encounter: Payer: Self-pay | Admitting: Internal Medicine

## 2023-02-23 ENCOUNTER — Other Ambulatory Visit (INDEPENDENT_AMBULATORY_CARE_PROVIDER_SITE_OTHER): Payer: Medicare PPO

## 2023-02-23 DIAGNOSIS — I10 Essential (primary) hypertension: Secondary | ICD-10-CM

## 2023-02-23 LAB — BASIC METABOLIC PANEL
BUN: 31 mg/dL — ABNORMAL HIGH (ref 6–23)
CO2: 28 mEq/L (ref 19–32)
Calcium: 9.9 mg/dL (ref 8.4–10.5)
Chloride: 100 mEq/L (ref 96–112)
Creatinine, Ser: 1.2 mg/dL (ref 0.40–1.50)
GFR: 62.78 mL/min (ref >60.00)
Glucose, Bld: 100 mg/dL — ABNORMAL HIGH (ref 70–99)
Potassium: 4.8 mEq/L (ref 3.5–5.1)
Sodium: 136 mEq/L (ref 135–145)

## 2023-03-09 ENCOUNTER — Encounter: Payer: Medicare PPO | Admitting: Gastroenterology

## 2023-03-11 ENCOUNTER — Encounter: Payer: Self-pay | Admitting: Internal Medicine

## 2023-03-11 ENCOUNTER — Ambulatory Visit (HOSPITAL_BASED_OUTPATIENT_CLINIC_OR_DEPARTMENT_OTHER)
Admission: RE | Admit: 2023-03-11 | Discharge: 2023-03-11 | Disposition: A | Discharge status: Home / Self Care | Payer: Medicare PPO | Source: Ambulatory Visit | Attending: Internal Medicine | Admitting: Internal Medicine

## 2023-03-11 ENCOUNTER — Ambulatory Visit: Payer: Medicare PPO | Admitting: Internal Medicine

## 2023-03-11 VITALS — BP 118/66 | HR 92 | Temp 97.8°F | Resp 18 | Ht 72.0 in | Wt 222.2 lb

## 2023-03-11 DIAGNOSIS — M79662 Pain in left lower leg: Secondary | ICD-10-CM | POA: Insufficient documentation

## 2023-03-11 DIAGNOSIS — I8002 Phlebitis and thrombophlebitis of superficial vessels of left lower extremity: Secondary | ICD-10-CM | POA: Diagnosis not present

## 2023-03-11 DIAGNOSIS — M7989 Other specified soft tissue disorders: Secondary | ICD-10-CM | POA: Insufficient documentation

## 2023-03-11 NOTE — Patient Instructions (Addendum)
Please go to the first floor and get ultrasound of your leg to rule out a clot.  For pain: Tylenol  500 mg OTC 2 tabs a day every 8 hours as needed for pain You can also try an ice pack or warm compresses.  Call anytime if symptoms severe.  Go to the ER if chest pain, difficulty with your respiration or palpitations.
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
## 2023-03-13 NOTE — Assessment & Plan Note (Signed)
L calf pain and swelling: Started 2 weeks ago, on clinical grounds I suspect a muscle tear however must rule out a DVT. Plan:  Stat ultrasound rule out DVT.  If negative will refer to orthopedics next week. Call if symptoms increase, see AVS. Also recommend Tylenol, ice pack or warm compresses as needed. Addendum: See ultrasound report, no DVT.  Suspect overall this is either leaking Baker's cyst baster is a bleeding from cuff tear.  Plan: See Ortho next week, were placing a referral, ER if symptoms worsen.  1. No evidence of DVT within the left lower extremity. 2. The examination is positive for age-indeterminate occlusive superficial thrombophlebitis involving the left lesser saphenous vein at the level of the calf. 3. Indeterminate bilobed at least 9.4 cm fluid collection within the subcutaneous tissues of the medial aspect of the left calf, nonspecific with differential considerations including a leaking component of a Baker's cyst versus a dissecting hematoma, less likely seroma or abscess. Clinical correlation is advised.

## 2023-03-15 ENCOUNTER — Telehealth: Payer: Self-pay

## 2023-03-15 NOTE — Telephone Encounter (Signed)
LMOM- Dr. Drue Novel wanted to know if he was able to secure a visit with [facility] this week. Asked him to call back if he was unable to get a visit.

## 2023-03-15 NOTE — Telephone Encounter (Signed)
Patient states calf has no swelling, little pain and has been resting the leg. He would like a call back to see if he should still see Ortho. Please call

## 2023-03-15 NOTE — Telephone Encounter (Signed)
Spoke w/ Pt--informed of PCP recommendations. Pt verbalized understanding.

## 2023-03-15 NOTE — Telephone Encounter (Signed)
Yes I recommend him to do so

## 2023-03-17 DIAGNOSIS — M79662 Pain in left lower leg: Secondary | ICD-10-CM | POA: Diagnosis not present

## 2023-03-22 ENCOUNTER — Ambulatory Visit (HOSPITAL_BASED_OUTPATIENT_CLINIC_OR_DEPARTMENT_OTHER): Payer: Medicare PPO | Admitting: Pulmonary Disease

## 2023-03-22 DIAGNOSIS — E890 Postprocedural hypothyroidism: Secondary | ICD-10-CM | POA: Diagnosis not present

## 2023-03-22 DIAGNOSIS — E1165 Type 2 diabetes mellitus with hyperglycemia: Secondary | ICD-10-CM | POA: Diagnosis not present

## 2023-03-22 DIAGNOSIS — C73 Malignant neoplasm of thyroid gland: Secondary | ICD-10-CM | POA: Diagnosis not present

## 2023-03-22 DIAGNOSIS — I1A0 Resistant hypertension: Secondary | ICD-10-CM | POA: Diagnosis not present

## 2023-03-22 DIAGNOSIS — E669 Obesity, unspecified: Secondary | ICD-10-CM | POA: Diagnosis not present

## 2023-03-22 LAB — HEMOGLOBIN A1C: Hemoglobin A1C: 6

## 2023-03-22 LAB — HM DIABETES FOOT EXAM

## 2023-03-31 ENCOUNTER — Encounter: Payer: Medicare PPO | Admitting: Gastroenterology

## 2023-04-02 NOTE — Progress Notes (Unsigned)
Cardiology Office Note:   Date:  04/04/2023  ID:  Tahari, Aber 09-19-1955, MRN [id number] PCP: Wanda Plump, MD  [facility] Providers Cardiologist:  Rollene Rotunda, MD {  History of Present Illness:   CARDER BELOW is a 67 y.o. male who presents for evaluation of coronary calcium.  This was seen on CT of the abdomen/pelvis.  He had three vessel coronary calcification.     I saw him previously for follow up of multiple risk factors.  He had a negative perfusion study in 2011.  He had a negative POET (Plain Old Exercise Treadmill) with no high risk findings in Dec 2021.   He had a thyroid lobectomy.   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  He has been seeing his endocrinologist.  They have stopped metoprolol, hydrochlorothiazide and Flomax as they were working up his adrenal gland.  They managed his blood pressure with Cardura and hydralazine.  He did have some SOB and had a coronary CT March 2023 with non obstructive disease.  He had moderate non obstructive large vessel disease with possible obstructive disease but in the distal LAD.      Since I last saw him he has done well.  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  He does a lot of landscaping.  He walks 5 to 10 miles a day and rides his bike an hour at a time frequently. The patient denies any new symptoms such as chest discomfort, neck or arm discomfort. There has been no new shortness of breath, PND or orthopnea. There have been no reported palpitations, presyncope or syncope.   ROS: As stated in the HPI and negative for all other systems.  Studies Reviewed:    EKG:   EKG Interpretation Date/Time:  Monday April 04 2023 08:58:48 EDT Ventricular Rate:  72 PR Interval:  104 QRS Duration:  94 QT Interval:  382 QTC Calculation: 418 R Axis:   58  Text Interpretation: Sinus rhythm with short PR When compared with ECG of 07-Mar-2018 11:02, No significant change was  found Confirmed by Rollene Rotunda ([id number]) on 04/04/2023 9:06:05 AM     Risk Assessment/Calculations:       Physical Exam:   VS:  BP 132/84 (BP Location: Left Arm, Patient Position: Sitting, Cuff Size: Normal)   Pulse 77   Ht 6' (1.829 m)   Wt 224 lb 12.8 oz (102 kg)   SpO2 96%   BMI 30.49 kg/m    Wt Readings from Last 3 Encounters:  04/04/23 224 lb 12.8 oz (102 kg)  03/11/23 222 lb 4 oz (100.8 kg)  02/17/23 220 lb 7.4 oz (100 kg)     GEN: Well nourished, well developed in no acute distress NECK: No JVD; No carotid bruits CARDIAC: RRR, 2 out of 6 brief apical systolic murmur radiating slightly at the aortic outflow tract, no diastolic murmurs, rubs, gallops RESPIRATORY:  Clear to auscultation without rales, wheezing or rhonchi  ABDOMEN: Soft, non-tender, non-distended EXTREMITIES:  No edema; No deformity   ASSESSMENT AND PLAN:   CAD    The patient has no new sypmtoms.  No further cardiovascular testing is indicated.  We will continue with aggressive risk reduction and meds as listed.    DM: A1c was 6.0.  No change in therapy   HTN: Blood pressure is mildly elevated.  I will increase the spironolactone to 50 mg daily and repeat a BMET in 2 weeks.  DYSLIPIDEMIA: LDL was 51 direct.  No change in therapy.   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  I will follow this clinically.        Follow up with me in one year.   Signed, Rollene Rotunda, MD

## 2023-04-04 ENCOUNTER — Encounter: Payer: Self-pay | Admitting: Cardiology

## 2023-04-04 ENCOUNTER — Ambulatory Visit: Payer: Medicare PPO | Attending: Cardiology | Admitting: Cardiology

## 2023-04-04 VITALS — BP 132/84 | HR 77 | Ht 72.0 in | Wt 224.8 lb

## 2023-04-04 DIAGNOSIS — I2510 Atherosclerotic heart disease of native coronary artery without angina pectoris: Secondary | ICD-10-CM | POA: Diagnosis not present

## 2023-04-04 DIAGNOSIS — E785 Hyperlipidemia, unspecified: Secondary | ICD-10-CM | POA: Diagnosis not present

## 2023-04-04 DIAGNOSIS — I10 Essential (primary) hypertension: Secondary | ICD-10-CM

## 2023-04-04 DIAGNOSIS — E118 Type 2 diabetes mellitus with unspecified complications: Secondary | ICD-10-CM

## 2023-04-04 MED ORDER — SPIRONOLACTONE 50 MG PO TABS: 50.0000 mg | ORAL_TABLET | Freq: Every day | ORAL | 3 refills | Status: DC

## 2023-04-04 NOTE — Patient Instructions (Signed)
Medication Instructions:   INCREASE SPIRONOLACTONE TO 50 MG ONCE DAILY= 2 OF THE 25 MG TABLETS ONCE DAILY  *If you need a refill on your cardiac medications before your next appointment, please call your pharmacy*   Lab Work: Your physician recommends that you return for lab work in: 2 WEEKS- DO NOT NEED TO FAST  If you have labs (blood work) drawn today and your tests are completely normal, you will receive your results only by: MyChart Message (if you have MyChart) OR A paper copy in the mail If you have any lab test that is abnormal or we need to change your treatment, we will call you to review the results.   Follow-Up: At [facility], you and your health needs are our priority.  As part of our continuing mission to provide you with exceptional heart care, we have created designated Provider Care Teams.  These Care Teams include your primary Cardiologist (physician) and Advanced Practice Providers (APPs -  Physician Assistants and Nurse Practitioners) who all work together to provide you with the care you need, when you need it.  We recommend signing up for the patient portal called "MyChart".  Sign up information is provided on this After Visit Summary.  MyChart is used to connect with patients for Virtual Visits (Telemedicine).  Patients are able to view lab/test results, encounter notes, upcoming appointments, etc.  Non-urgent messages can be sent to your provider as well.   To learn more about what you can do with MyChart, go to [email or web address].    Your next appointment:   12 month(s)  Provider:   Rollene Rotunda, MD

## 2023-04-13 ENCOUNTER — Telehealth: Payer: Self-pay | Admitting: *Deleted

## 2023-04-13 ENCOUNTER — Encounter: Payer: Self-pay | Admitting: Gastroenterology

## 2023-04-13 ENCOUNTER — Ambulatory Visit: Payer: Medicare PPO | Admitting: *Deleted

## 2023-04-13 VITALS — Ht 72.0 in | Wt 220.0 lb

## 2023-04-13 DIAGNOSIS — Z860100 Personal history of colon polyps, unspecified: Secondary | ICD-10-CM

## 2023-04-13 MED ORDER — PEG 3350-KCL-NA BICARB-NACL 420 G PO SOLR
4000.0000 mL | Freq: Once | ORAL | 0 refills | Status: AC
Start: 2023-04-13 — End: 2023-04-13

## 2023-04-13 NOTE — Telephone Encounter (Signed)
Attempt to reach pt for pre-visit. LM with call back #.  Will attempt to reach again in 5 min due to no other # listed in profile  Second attempt to reach pt for pre-vist unsuccessful. LM with facility # for pt to call back. Instructed pt to call # given by end of the day and reschedule the pre-visit  with RN or the scheduled procedure will be canceled.

## 2023-04-13 NOTE — Progress Notes (Signed)
Pt's name and DOB verified at the beginning of the pre-visit wit 2 identifiers  Pt denies any difficulty with ambulating,sitting, laying down or rolling side to side  Gave both [facility] main # and MD on call # prior to instructions.   No egg or soy allergy known to patient   No issues known to pt with past sedation with any surgeries or procedures  Pt denies having issues being intubated  Pt has no issues moving head neck or swallowing  No FH of Malignant Hyperthermia  Pt is not on diet pills or shots  Pt is not on home 02   Pt is not on blood thinners   Pt denies issues with constipation   Pt is not on dialysis  Pt denise any abnormal heart rhythms   Pt denies any upcoming cardiac testing  Pt encouraged to use to use Singlecare or Goodrx to reduce cost   Patient's chart reviewed by Cathlyn Parsons CNRA prior to pre-visit and patient appropriate for the [facility].  Pre-visit completed and red dot placed by patient's name on their procedure day (on provider's schedule).  .  Visit by phone  Pt states weight is 220 lb important to and  to call if they have any changes in health or new medications. Directed them to the # given and on instructions.     Instructions reviewed with pt and pt states understanding. Instructed to review again prior to procedure. Pt states they will.   Instructions sent by mail with coupon and by my chart

## 2023-04-18 ENCOUNTER — Telehealth: Payer: Self-pay | Admitting: *Deleted

## 2023-04-18 NOTE — Telephone Encounter (Signed)
LM instructing pt not to start any prep work medications because his proedure was canceled due to the inability to reach pt for pre-visit.

## 2023-04-18 NOTE — Telephone Encounter (Signed)
LM instructing pt not to start any prep work medications because his procedure was canceled due to the inability to reach pt for pre-visit.

## 2023-04-20 ENCOUNTER — Ambulatory Visit: Payer: Medicare PPO | Admitting: Gastroenterology

## 2023-04-20 ENCOUNTER — Encounter: Payer: Self-pay | Admitting: Gastroenterology

## 2023-04-20 VITALS — BP 123/74 | HR 55 | Temp 97.7°F | Resp 13 | Ht 72.0 in | Wt 220.0 lb

## 2023-04-20 DIAGNOSIS — Z860100 Personal history of colon polyps, unspecified: Secondary | ICD-10-CM | POA: Diagnosis not present

## 2023-04-20 DIAGNOSIS — E119 Type 2 diabetes mellitus without complications: Secondary | ICD-10-CM | POA: Diagnosis not present

## 2023-04-20 DIAGNOSIS — I10 Essential (primary) hypertension: Secondary | ICD-10-CM | POA: Diagnosis not present

## 2023-04-20 DIAGNOSIS — Z09 Encounter for follow-up examination after completed treatment for conditions other than malignant neoplasm: Secondary | ICD-10-CM | POA: Diagnosis not present

## 2023-04-20 DIAGNOSIS — Z860101 Personal history of adenomatous and serrated colon polyps: Secondary | ICD-10-CM | POA: Diagnosis not present

## 2023-04-20 DIAGNOSIS — D124 Benign neoplasm of descending colon: Secondary | ICD-10-CM | POA: Diagnosis not present

## 2023-04-20 DIAGNOSIS — K635 Polyp of colon: Secondary | ICD-10-CM | POA: Diagnosis not present

## 2023-04-20 DIAGNOSIS — D122 Benign neoplasm of ascending colon: Secondary | ICD-10-CM

## 2023-04-20 DIAGNOSIS — G4733 Obstructive sleep apnea (adult) (pediatric): Secondary | ICD-10-CM | POA: Diagnosis not present

## 2023-04-20 DIAGNOSIS — D125 Benign neoplasm of sigmoid colon: Secondary | ICD-10-CM

## 2023-04-20 DIAGNOSIS — D123 Benign neoplasm of transverse colon: Secondary | ICD-10-CM

## 2023-04-20 DIAGNOSIS — K640 First degree hemorrhoids: Secondary | ICD-10-CM | POA: Diagnosis not present

## 2023-04-20 MED ORDER — SODIUM CHLORIDE 0.9 % IV SOLN: 500.0000 mL | INTRAVENOUS | Status: DC

## 2023-04-20 MED ORDER — DEXTROSE 5 % IV SOLN: INTRAVENOUS | Status: AC

## 2023-04-20 NOTE — Op Note (Signed)
[facility] Patient Name: Leonard [name] Procedure Date: 04/20/2023 10:13 AM MRN: [id number] Endoscopist: Meryl Dare , MD, [phone] Age: 67 Referring MD:  Date of Birth: [birth year] Gender: Male Account #: [id number] Procedure:                Colonoscopy Indications:              High risk colon cancer surveillance: Personal                            history of sessile serrated colon polyp (less than                            10 mm in size) with no dysplasia Medicines:                Monitored Anesthesia Care Procedure:                Pre-Anesthesia Assessment:                           - Prior to the procedure, a History and Physical                            was performed, and patient medications and                            allergies were reviewed. The patient's tolerance of                            previous anesthesia was also reviewed. The risks                            and benefits of the procedure and the sedation                            options and risks were discussed with the patient.                            All questions were answered, and informed consent                            was obtained. Prior Anticoagulants: The patient has                            taken no anticoagulant or antiplatelet agents. ASA                            Grade Assessment: II - A patient with mild systemic                            disease. After reviewing the risks and benefits,                            the patient was deemed in satisfactory condition to  undergo the procedure.                           After obtaining informed consent, the colonoscope                            was passed under direct vision. Throughout the                            procedure, the patient's blood pressure, pulse, and                            oxygen saturations were monitored continuously. The                            Olympus Scope SN:  [id number] was introduced through                            the anus and advanced to the the cecum, identified                            by appendiceal orifice and ileocecal valve. The                            ileocecal valve, appendiceal orifice, and rectum                            were photographed. The quality of the bowel                            preparation was adequate after extensive lavage,                            suction. The colonoscopy was performed without                            difficulty. The patient tolerated the procedure                            well. Scope In: 10:23:04 AM Scope Out: 10:49:52 AM Scope Withdrawal Time: 0 hours 24 minutes 24 seconds  Total Procedure Duration: 0 hours 26 minutes 48 seconds  Findings:                 The perianal and digital rectal examinations were                            normal.                           Four sessile polyps were found in the ascending                            colon. The polyps were 5 to 7 mm in size. These  polyps were removed with a cold snare. Resection                            and retrieval were complete.                           Four sessile polyps were found in the transverse                            colon. The polyps were 5 to 7 mm in size. These                            polyps were removed with a cold snare. Resection                            and retrieval were complete.                           Four sessile polyps were found in the transverse                            colon. The polyps were 3 to 4 mm in size. These                            polyps were removed with a cold biopsy forceps.                            Resection and retrieval were complete.                           Three sessile polyps were found in the sigmoid                            colon (1) and descending colon (2). The polyps were                            6 to 9 mm in size. These polyps  were removed with a                            cold snare. Resection and retrieval were complete.                           Internal hemorrhoids were found during                            retroflexion. The hemorrhoids were small and Grade                            I (internal hemorrhoids that do not prolapse).                           The exam was otherwise without abnormality on  direct and retroflexion views. Complications:            No immediate complications. Estimated blood loss:                            None. Estimated Blood Loss:     Estimated blood loss: none. Impression:               - Four 5 to 7 mm polyps in the ascending colon,                            removed with a cold snare. Resected and retrieved.                           - Four 5 to 7 mm polyps in the transverse colon,                            removed with a cold snare. Resected and retrieved.                           - Four 3 to 4 mm polyps in the transverse colon,                            removed with a cold biopsy forceps. Resected and                            retrieved.                           - Three 6 to 9 mm polyps in the sigmoid colon and                            in the descending colon, removed with a cold snare.                            Resected and retrieved.                           - Internal hemorrhoids.                           - The examination was otherwise normal on direct                            and retroflexion views. Recommendation:           - Repeat colonoscopy after studies are complete for                            surveillance based on pathology results.                           - Patient has a contact number available for  emergencies. The signs and symptoms of potential                            delayed complications were discussed with the                            patient. Return to normal activities  tomorrow.                            Written discharge instructions were provided to the                            patient.                           - Resume previous diet.                           - Continue present medications.                           - Await pathology results. Meryl Dare, MD 04/20/2023 10:56:04 AM This report has been signed electronically.

## 2023-04-20 NOTE — Patient Instructions (Signed)
**  Handouts given on polyps and Hemorrhoids**   YOU HAD AN ENDOSCOPIC PROCEDURE TODAY AT [place]:   Refer to the procedure report that was given to you for any specific questions about what was found during the examination.  If the procedure report does not answer your questions, please call your gastroenterologist to clarify.  If you requested that your care partner not be given the details of your procedure findings, then the procedure report has been included in a sealed envelope for you to review at your convenience later.  YOU SHOULD EXPECT: Some feelings of bloating in the abdomen. Passage of more gas than usual.  Walking can help get rid of the air that was put into your GI tract during the procedure and reduce the bloating. If you had a lower endoscopy (such as a colonoscopy or flexible sigmoidoscopy) you may notice spotting of blood in your stool or on the toilet paper. If you underwent a bowel prep for your procedure, you may not have a normal bowel movement for a few days.  Please Note:  You might notice some irritation and congestion in your nose or some drainage.  This is from the oxygen used during your procedure.  There is no need for concern and it should clear up in a day or so.  SYMPTOMS TO REPORT IMMEDIATELY:  Following lower endoscopy (colonoscopy or flexible sigmoidoscopy):  Excessive amounts of blood in the stool  Significant tenderness or worsening of abdominal pains  Swelling of the abdomen that is new, acute  Fever of 100F or higher   For urgent or emergent issues, a gastroenterologist can be reached at any hour by calling [phone]. Do not use MyChart messaging for urgent concerns.    DIET:  We do recommend a small meal at first, but then you may proceed to your regular diet.  Drink plenty of fluids but you should avoid alcoholic beverages for 24 hours.  ACTIVITY:  You should plan to take it easy for the rest of today and you should NOT  DRIVE or use heavy machinery until tomorrow (because of the sedation medicines used during the test).    FOLLOW UP: Our staff will call the number listed on your records the next business day following your procedure.  We will call around 7:15- 8:00 am to check on you and address any questions or concerns that you may have regarding the information given to you following your procedure. If we do not reach you, we will leave a message.     If any biopsies were taken you will be contacted by phone or by letter within the next 1-3 weeks.  Please call us at [phone] if you have not heard about the biopsies in 3 weeks.    SIGNATURES/CONFIDENTIALITY: You and/or your care partner have signed paperwork which will be entered into your electronic medical record.  These signatures attest to the fact that that the information above on your After Visit Summary has been reviewed and is understood.  Full responsibility of the confidentiality of this discharge information lies with you and/or your care-partner.

## 2023-04-20 NOTE — Progress Notes (Signed)
See 04/04/2023 H&P no changes

## 2023-04-20 NOTE — Progress Notes (Signed)
CBG 57. CRNA made aware.PIV placed and  D5W  started  repeat CBG 77.  Pt's states no medical or surgical changes since previsit or office visit.

## 2023-04-20 NOTE — Progress Notes (Signed)
Report to PACU, RN, vss, BBS= Clear.  

## 2023-04-20 NOTE — Progress Notes (Signed)
Called to room to assist during endoscopic procedure.  Patient ID and intended procedure confirmed with present staff. Received instructions for my participation in the procedure from the performing physician.  

## 2023-04-21 ENCOUNTER — Telehealth: Payer: Self-pay

## 2023-04-21 NOTE — Telephone Encounter (Signed)
  Follow up Call-  [facility]

## 2023-04-22 LAB — SURGICAL PATHOLOGY

## 2023-04-29 DIAGNOSIS — I10 Essential (primary) hypertension: Secondary | ICD-10-CM | POA: Diagnosis not present

## 2023-04-30 LAB — BASIC METABOLIC PANEL
BUN/Creatinine Ratio: 19 (ref 10–24)
BUN: 23 mg/dL (ref 8–27)
CO2: 20 mmol/L (ref 20–29)
Calcium: 9.2 mg/dL (ref 8.6–10.2)
Chloride: 103 mmol/L (ref 96–106)
Creatinine, Ser: 1.21 mg/dL (ref 0.76–1.27)
Glucose: 132 mg/dL — ABNORMAL HIGH (ref 70–99)
Potassium: 4.9 mmol/L (ref 3.5–5.2)
Sodium: 138 mmol/L (ref 134–144)
eGFR: 66 mL/min/{1.73_m2} (ref >59)

## 2023-05-02 ENCOUNTER — Encounter: Payer: Self-pay | Admitting: Gastroenterology

## 2023-05-05 ENCOUNTER — Encounter: Payer: Self-pay | Admitting: *Deleted

## 2023-05-12 ENCOUNTER — Encounter: Payer: Self-pay | Admitting: Internal Medicine

## 2023-05-16 ENCOUNTER — Ambulatory Visit: Payer: Medicare PPO | Admitting: Internal Medicine

## 2023-05-25 ENCOUNTER — Ambulatory Visit (HOSPITAL_BASED_OUTPATIENT_CLINIC_OR_DEPARTMENT_OTHER): Payer: Medicare PPO | Admitting: Pulmonary Disease

## 2023-06-03 ENCOUNTER — Ambulatory Visit: Payer: Medicare PPO | Admitting: Internal Medicine

## 2023-06-06 ENCOUNTER — Other Ambulatory Visit (HOSPITAL_BASED_OUTPATIENT_CLINIC_OR_DEPARTMENT_OTHER): Payer: Self-pay

## 2023-06-06 ENCOUNTER — Encounter: Payer: Self-pay | Admitting: Internal Medicine

## 2023-06-06 ENCOUNTER — Ambulatory Visit: Payer: Medicare PPO | Admitting: Internal Medicine

## 2023-06-06 VITALS — BP 120/76 | HR 74 | Temp 98.2°F | Resp 18 | Ht 72.0 in | Wt 219.5 lb

## 2023-06-06 DIAGNOSIS — I10 Essential (primary) hypertension: Secondary | ICD-10-CM

## 2023-06-06 DIAGNOSIS — E1142 Type 2 diabetes mellitus with diabetic polyneuropathy: Secondary | ICD-10-CM | POA: Diagnosis not present

## 2023-06-06 DIAGNOSIS — E079 Disorder of thyroid, unspecified: Secondary | ICD-10-CM

## 2023-06-06 DIAGNOSIS — E782 Mixed hyperlipidemia: Secondary | ICD-10-CM | POA: Diagnosis not present

## 2023-06-06 DIAGNOSIS — Z23 Encounter for immunization: Secondary | ICD-10-CM | POA: Diagnosis not present

## 2023-06-06 DIAGNOSIS — Z794 Long term (current) use of insulin: Secondary | ICD-10-CM

## 2023-06-06 LAB — LIPID PANEL
Cholesterol: 104 mg/dL (ref 0–200)
HDL: 32.7 mg/dL — ABNORMAL LOW (ref >39.00)
LDL Cholesterol: 50 mg/dL (ref 0–99)
NonHDL: 71.39
Total CHOL/HDL Ratio: 3
Triglycerides: 105 mg/dL (ref 0.0–149.0)
VLDL: 21 mg/dL (ref 0.0–40.0)

## 2023-06-06 LAB — AST: AST: 20 U/L (ref 0–37)

## 2023-06-06 LAB — TSH: TSH: 0.21 u[IU]/mL — ABNORMAL LOW (ref 0.35–5.50)

## 2023-06-06 LAB — ALT: ALT: 25 U/L (ref 0–53)

## 2023-06-06 MED ORDER — COMIRNATY 30 MCG/0.3ML IM SUSY
0.3000 mL | PREFILLED_SYRINGE | Freq: Once | INTRAMUSCULAR | 0 refills | Status: AC
  Filled 2023-06-06: qty 0.3, 1d supply, fill #0

## 2023-06-06 NOTE — Patient Instructions (Addendum)
Vaccines I recommend: Covid booster Tdap (tetanus)   Check the  blood pressure regularly Blood pressure goal:  between 110/65 and  135/85. If it is consistently higher or lower, let me know     GO TO THE LAB : Get the blood work     Next visit with me by May 2025, CPX    Please schedule it at the front desk

## 2023-06-06 NOTE — Assessment & Plan Note (Signed)
Left calf pain and swelling: See LOV, US showed a L  calf fluid collection likely from Baker's cyst, saw Dr. Despina Hick office, Rx conservative treatment.  Feeling better. DM: Per endocrinology HTN: BP was elevated 04/04/2023 when he saw cardiology, spironolactone increased to 50 mg, f/u BMP ok.  Ambulatory BPs in the 120s/80s.  No change, continue checking Hyperlipidemia: Last LDL very good, continue atorvastatin, check a FLP AST ALT CAD: Saw cards 04/04/2023 no further testing recommended Hypothyroidism: On Synthroid, check TSH Anxiety depression: Per Dr. Evelene Croon, reports he is forgetful, recommend to discuss with psych Preventive care: Flu shot today, recommend COVID booster and Tdap at the pharmacy. RTC 10/2023 CPX
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
## 2023-06-07 LAB — MICROALBUMIN / CREATININE URINE RATIO
Creatinine,U: 128.3 mg/dL
Microalb Creat Ratio: 1.5 mg/g (ref 0.0–30.0)
Microalb, Ur: 2 mg/dL — ABNORMAL HIGH (ref 0.0–1.9)

## 2023-06-07 MED ORDER — LEVOTHYROXINE SODIUM 150 MCG PO TABS: 150.0000 ug | ORAL_TABLET | Freq: Every day | ORAL | 1 refills | Status: AC

## 2023-06-07 NOTE — Addendum Note (Signed)
Addended byConrad Trappe D on: 06/07/2023 02:24 PM   Modules accepted: Orders

## 2023-06-30 ENCOUNTER — Encounter (HOSPITAL_BASED_OUTPATIENT_CLINIC_OR_DEPARTMENT_OTHER): Payer: Self-pay | Admitting: Pulmonary Disease

## 2023-06-30 ENCOUNTER — Ambulatory Visit (HOSPITAL_BASED_OUTPATIENT_CLINIC_OR_DEPARTMENT_OTHER): Payer: Medicare PPO | Admitting: Pulmonary Disease

## 2023-06-30 VITALS — BP 102/68 | HR 72 | Resp 14 | Ht 72.0 in | Wt 222.1 lb

## 2023-06-30 DIAGNOSIS — G4733 Obstructive sleep apnea (adult) (pediatric): Secondary | ICD-10-CM

## 2023-06-30 NOTE — Progress Notes (Signed)
 Subjective:   PATIENT ID: Leonard [name] GENDER: male DOB: May 15, 1956, MRN: [id number]  Chief Complaint  Patient presents with   Follow-up    IS using CPAP, has lost weight but thinks [name] can sleep without it. Wants to talk about nose clip instead of full face mask. Snores with mask and [name] wakes up- nightmares every night.     Reason for Visit: Follow-up  [name] is a 68 year old male former smoker with [name], Leonard [name], Leonard [name], Leonard [name], Leonard thyroid  cancer who present for follow-up. Previously followed by Leonard [name] at [facility] for [name].  Initial consult with Leonard [name] has previously been told his apnea has improved however [name] cannot sleep without CPAP device as the snoring will awaken him. [name] sleeps better CPAP. Owns his current CPAP since 2018.  [name] reports upper chest pain/congestion bilaterally that occurs in the mornings that is worsened by productive cough. Denies wheezing. 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 Associated with ear congestion. Improves once [name] is up and during the day. Denies asthma history.   06/30/23 Since our last visit [name] has been compliant with his CPAP. Nasal mask is uncomfortable but tolerable. [name] is interested in trying nasal prongs if this is an option. [name] does report shortness of breath with exertion. No longer having chest tightness. Not using albuterol  inhaler. Not regularly exercising. Denies coughing or wheezing. Has recently started biking one hour a day.  [name] reports recent nightmares requiring movement like kicking and punching as [name] is being weaned off medications including xanax . This is under the assistance of his psychiatrist. Would like to hold off on sleep consult for now.  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 1 ppd x 30 years. 30 pack-years. Quit 2004 Environmental exposures: Radiation  Past Medical History:  Diagnosis Date   ADD (attention deficit disorder)    Anxiety and depression     see's Leonard [name]   Bleeding nose    Right nostril severe bleeding   BPH (benign prostatic hyperplasia)    Bulging lumbar disc    2 in lower back   DM (diabetes mellitus) (HCC)    dx 13 yrs now   Fracture, rib 02/25/2018   Number 10 left side   Heart murmur    History of kidney stones    Leonard [name] (hypertension)    controlled   Nephrolithiasis    has had 7 kidney stones   OCD (obsessive compulsive disorder)    Osteoarthritis    Other and unspecified hyperlipidemia    Papillary thyroid  carcinoma (HCC) 2021   s/p thyroidectomy   Renal artery stenosis (HCC)    some due to ageing   Sleep apnea    mild'-uses CPAP     Family History  Problem Relation Age of Onset   Bipolar disorder Mother    Depression Mother    Early death Mother    Heart failure Father        Deceased at 83-valvular heart disease   Prostate cancer Father    Cancer Father    Heart disease Father    Bipolar disorder Sister    Depression Sister    Diabetes Maternal Grandfather    Hypertension Maternal Grandfather    Depression Daughter    OCD Son    Diabetes Other        Grandfather-Melitus   Prostate cancer Other        Uncles   Colon cancer Neg Leonard  CAD Neg Leonard    Colon polyps Neg Leonard    Esophageal cancer Neg Leonard    Rectal cancer Neg Leonard    Stomach cancer Neg Leonard      Social History   Occupational History   Occupation: retired runner, 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, 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: [employer]  Tobacco Use   Smoking status: Former    Current packs/day: 0.00    Average packs/day: 0.5 packs/day for 25.0 years (12.5 ttl pk-yrs)    Types: Cigarettes    Start date: 08/02/1978    Quit date: 08/03/2003    Years since quitting: 19.9   Smokeless tobacco: Never   Tobacco comments:    Quit 2004  Vaping Use   Vaping status: Never Used  Substance and Sexual Activity   Alcohol use: No   Drug use: No   Sexual activity: Yes    Birth control/protection: None    Allergies  Allergen  Reactions   Naproxen Anxiety, Shortness Of Breath and Palpitations   Niacin Rash     Outpatient Medications Prior to Visit  Medication Sig Dispense Refill   allopurinol  (ZYLOPRIM ) 300 MG tablet Take 300 mg by mouth daily.     ALPRAZolam  (XANAX ) 1 MG tablet Take 1 tablet (1 mg total) by mouth 3 (three) times daily as needed. But max of 2.5 mg total per day (Patient taking differently: Take 1 mg by mouth 2 (two) times daily as needed. But max of 2.5 mg total per day) 75 tablet 0   amphetamine -dextroamphetamine  (ADDERALL) 10 MG tablet Take 1 tablet (10 mg total) by mouth with breakfast, with lunch, and with evening meal. (Patient taking differently: Take 5 mg by mouth with breakfast, with lunch, and with evening meal.) 90 tablet 0   aspirin  81 MG tablet Take 81 mg by mouth at bedtime.      atorvastatin  (LIPITOR) 20 MG tablet TAKE ONE TABLET BY MOUTH DAILY 90 tablet 1   benazepril -hydrochlorthiazide (LOTENSIN  HCT) 20-25 MG tablet Take 1 tablet by mouth daily.     Blood Glucose Monitoring Suppl (ONE TOUCH ULTRA SYSTEM KIT) w/Device KIT 1 kit by Does not apply route once. 1 each 0   chlordiazePOXIDE HCl (LIBRIUM PO) Take 1 each by mouth daily.     cholecalciferol (VITAMIN D3) 25 MCG (1000 UNIT) tablet Take 1,000 Units by mouth daily.     Cyanocobalamin (VITAMIN B 12 PO) Take by mouth.     EPINEPHrine  0.3 mg/0.3 mL IJ SOAJ injection Inject 0.3 mg into the muscle as needed for anaphylaxis. 2 each 0   escitalopram  (LEXAPRO ) 20 MG tablet Take 2 tablets (40 mg total) by mouth daily. (Patient taking differently: Take 20 mg by mouth daily.) 60 tablet 1   insulin  degludec (TRESIBA FLEXTOUCH) 200 UNIT/ML FlexTouch Pen Inject 34 Units into the skin daily.     Insulin  Pen Needle (BD PEN NEEDLE NANO U/F) 32G X 4 MM MISC TO USE WITH INSULIN  100 each 0   JARDIANCE 25 MG TABS tablet Take 1 tablet by mouth daily.     Lancets (ONETOUCH ULTRASOFT) lancets Check blood sugar no more than twice daily 100 each 12    levothyroxine  (SYNTHROID ) 150 MCG tablet Take 1 tablet (150 mcg total) by mouth daily before breakfast. 30 tablet 1   MAGNESIUM GLYCINATE PO Take 500 mg by mouth daily.     metFORMIN  (GLUCOPHAGE ) 1000 MG tablet Take 1,000 mg by mouth 2 (two) times daily  with a meal.     Multiple Vitamin (MULTI-VITAMIN) tablet Take 1 tablet by mouth daily.     Omega-3 Fatty Acids (FISH OIL) 1200 MG CPDR Take by mouth.     ONETOUCH ULTRA test strip CHECK BLOOD SUGAR NO MORE THAN TWICE DAILY 100 each 1   potassium citrate  (UROCIT-K ) 10 MEQ (1080 MG) SR tablet Take 1 tablet by mouth 2 (two) times daily with a meal.     spironolactone  (ALDACTONE ) 50 MG tablet Take 1 tablet (50 mg total) by mouth daily. (Patient taking differently: Take 40 mg by mouth daily.) 90 tablet 3   tamsulosin  (FLOMAX ) 0.4 MG CAPS Take 0.4 mg by mouth at bedtime.     tirzepatide (MOUNJARO) 12.5 MG/0.5ML Pen Inject 12.5 mg into the skin once a week.     albuterol  (VENTOLIN  HFA) 108 (90 Base) MCG/ACT inhaler Inhale 2 puffs into the lungs every 4 (four) hours as needed for wheezing or shortness of breath (chest tightness). (Patient not taking: Reported on 06/30/2023) 8 g 2   No facility-administered medications prior to visit.    Review of Systems  Constitutional:  Negative for chills, diaphoresis, fever, malaise/fatigue and weight loss.  HENT:  Negative for congestion.   Respiratory:  Positive for shortness of breath. Negative for cough, hemoptysis, sputum production and wheezing.   Cardiovascular:  Negative for chest pain, palpitations and leg swelling.     Objective:   Vitals:   06/30/23 0955  BP: 102/68  Pulse: 72  Resp: 14  SpO2: 96%  Weight: 222 lb 1.6 oz (100.7 kg)  Height: 6' (1.829 m)   SpO2: 96 %  Physical Exam: General: Well-appearing, no acute distress HENT: [place], AT Eyes: EOMI, no scleral icterus Respiratory: Clear to auscultation bilaterally.  No crackles, wheezing or rales Cardiovascular: RRR, -M/R/G, no  JVD Extremities:-Edema,-tenderness Neuro: AAO x4, CNII-XII grossly intact Psych: Normal mood, normal affect  Data Reviewed:  Imaging: CT Coronary 09/22/21 - Visualized lung parenchyma with no pulmonary nodules, masses, infiltrate, effusion or pneumothorax.  PFT: None on file  Labs: CBC    Component Value Date/Time   WBC 8.7 01/31/2023 0919   RBC 5.09 01/31/2023 0919   HGB 15.2 01/31/2023 0919   HCT 46.7 01/31/2023 0919   PLT 344.0 01/31/2023 0919   MCV 91.7 01/31/2023 0919   MCH 29.7 03/07/2018 1052   MCHC 32.5 01/31/2023 0919   RDW 14.7 01/31/2023 0919   LYMPHSABS 2.5 01/31/2023 0919   MONOABS 0.8 01/31/2023 0919   EOSABS 0.5 01/31/2023 0919   BASOSABS 0.0 01/31/2023 0919   Sleep PSG 01/04/07 (wt 275) recorded 232 minutes of sleep with only 24.5 minutes of REM sleep. The sleep efficiency of 65%. Although the oral AHI was 8 per hour, but REM related AHI was31.8. The lowest desaturation was 87%. Longest hypopnea was 43 seconds. His lowest heart rate was 52 beats per minute during non-REM sleep. No PLMs or arrhythmias were noted.   CPAP 11/06/22-12/05/22 30/30 days (100%) >4 hours 100% CPAP 10 cm H20 AHI 0.2   CPAP 05/31/23-06/29/23 30/30 days (100%) >4 hours 100% CPAP 10 cm H20 AHI 0.5     Assessment & Plan:   Discussion: 68 year old male former smoker with [name], Leonard [name], Leonard [name], Leonard [name], Leonard thyroid  cancer who presents for follow-up. Prior chest pain has resolved. [name] well controlled and compliance report reviewed.   [name] Patient uses NIV for more than four hours nightly for at least 70% of nights during the last three months of  usage. The patient has been using and benefiting from PAP use and will continue to benefit from therapy.  --Interested in nasal prongs for his CPAP mask.  --Current device obtained in 2018 and working well --May need repeat sleep test in the future when [name] needs machine replacement --Counseled on sleep hygiene --Counseled on weight  loss/maintenance of healthy weight --Counseled NOT to drive if/when sleepy --Advised patient to wear CPAP for at least 4 hours each night for greater than 70% of the time to avoid the machine being repossessed by insurance.  Nightmares --Will consider sleep referral if persistent  Chest tightness - self resolved   Health Maintenance Immunization History  Administered Date(s) Administered   Fluad Quad(high Dose 65+) 05/06/2020, 05/20/2021   Fluad Trivalent(High Dose 65+) 06/06/2023   Hepatitis A 11/11/2005   IPV 11/11/2005   Influenza Split 07/12/2011, 04/06/2012, 04/28/2022   Influenza, High Dose Seasonal PF 04/12/2017   Influenza,inj,Quad PF,6+ Mos 06/07/2013, 05/16/2014, 08/11/2015, 07/09/2016, 05/22/2018, 03/12/2019   Moderna Covid-19 Vaccine Bivalent Booster 81yrs & up 06/12/2021   Moderna Sars-Covid-2 Vaccination 08/24/2019, 09/21/2019, 11/13/2020   PFIZER(Purple Top)SARS-COV-2 Vaccination 06/06/2020   PNEUMOCOCCAL CONJUGATE-20 05/20/2021   Pfizer(Comirnaty )Fall Seasonal Vaccine 12 years and older 06/06/2023   Pneumococcal Conjugate-13 05/16/2014   Pneumococcal Polysaccharide-23 07/19/2013   Respiratory Syncytial Virus Vaccine ,Recomb Aduvanted(Arexvy ) 01/31/2023   Tdap 06/07/2013   Typhoid Live 11/11/2005   Zoster Recombinant(Shingrix ) 02/08/2022, 11/08/2022   Zoster, Live 07/09/2016   CT Lung Screen - not qualified  No orders of the defined types were placed in this encounter.  No orders of the defined types were placed in this encounter.   Return in about 8 months (around 02/28/2024).  I have spent a total time of 32-minutes on the day of the appointment including chart review, data review, collecting history, coordinating care and discussing medical diagnosis and plan with the patient/family. Past medical history, allergies, medications were reviewed. Pertinent imaging, labs and tests included in this note have been reviewed and interpreted independently by me.  Chasitee Zenker  Slater Staff, MD [facility] 06/30/2023 10:01 AM  Office Number [phone]

## 2023-06-30 NOTE — Patient Instructions (Signed)
 OSA Patient uses NIV for more than four hours nightly for at least 70% of nights during the last three months of usage. The patient has been using and benefiting from PAP use and will continue to benefit from therapy.  --Interested in nasal prongs for his CPAP mask.  --Current device obtained in 2018 and working well --May need repeat sleep test in the future when he needs machine replacement --Counseled on sleep hygiene --Counseled on weight loss/maintenance of healthy weight --Counseled NOT to drive if/when sleepy --Advised patient to wear CPAP for at least 4 hours each night for greater than 70% of the time to avoid the machine being repossessed by insurance.

## 2023-08-22 ENCOUNTER — Encounter: Payer: Self-pay | Admitting: Internal Medicine

## 2023-08-23 ENCOUNTER — Telehealth: Payer: Self-pay | Admitting: Neurology

## 2023-08-23 NOTE — Telephone Encounter (Signed)
 TSH already ordered.   Copied from CRM [id number]. Topic: Clinical - Request for Lab/Test Order >> Aug 23, 2023  2:24 PM Sim Boast F wrote: Reason for CRM: Per Dr. Drue Novel MyChart message it's time for patient to recheck thyroid levels, I scheduled patient for 2/28 and a new order is needed

## 2023-08-26 ENCOUNTER — Other Ambulatory Visit (INDEPENDENT_AMBULATORY_CARE_PROVIDER_SITE_OTHER): Payer: Medicare PPO

## 2023-08-26 DIAGNOSIS — E079 Disorder of thyroid, unspecified: Secondary | ICD-10-CM | POA: Diagnosis not present

## 2023-08-26 LAB — TSH: TSH: 2.23 u[IU]/mL (ref 0.35–5.50)

## 2023-08-28 ENCOUNTER — Encounter: Payer: Self-pay | Admitting: Internal Medicine

## 2023-09-20 DIAGNOSIS — C73 Malignant neoplasm of thyroid gland: Secondary | ICD-10-CM | POA: Diagnosis not present

## 2023-09-20 DIAGNOSIS — I1A0 Resistant hypertension: Secondary | ICD-10-CM | POA: Diagnosis not present

## 2023-09-20 DIAGNOSIS — E890 Postprocedural hypothyroidism: Secondary | ICD-10-CM | POA: Diagnosis not present

## 2023-09-20 DIAGNOSIS — E66811 Obesity, class 1: Secondary | ICD-10-CM | POA: Diagnosis not present

## 2023-09-20 DIAGNOSIS — Z794 Long term (current) use of insulin: Secondary | ICD-10-CM | POA: Diagnosis not present

## 2023-09-20 DIAGNOSIS — E1165 Type 2 diabetes mellitus with hyperglycemia: Secondary | ICD-10-CM | POA: Diagnosis not present

## 2023-09-20 LAB — HEMOGLOBIN A1C: Hemoglobin A1C: 6.4

## 2023-09-29 ENCOUNTER — Encounter: Payer: Self-pay | Admitting: Internal Medicine

## 2023-10-05 DIAGNOSIS — H52203 Unspecified astigmatism, bilateral: Secondary | ICD-10-CM | POA: Diagnosis not present

## 2023-10-05 DIAGNOSIS — H5203 Hypermetropia, bilateral: Secondary | ICD-10-CM | POA: Diagnosis not present

## 2023-10-05 DIAGNOSIS — E119 Type 2 diabetes mellitus without complications: Secondary | ICD-10-CM | POA: Diagnosis not present

## 2023-10-05 LAB — HM DIABETES EYE EXAM

## 2023-10-07 ENCOUNTER — Encounter: Payer: Self-pay | Admitting: Internal Medicine

## 2023-11-01 ENCOUNTER — Ambulatory Visit (INDEPENDENT_AMBULATORY_CARE_PROVIDER_SITE_OTHER)

## 2023-11-01 VITALS — Ht 72.0 in | Wt 215.0 lb

## 2023-11-01 DIAGNOSIS — Z0000 Encounter for general adult medical examination without abnormal findings: Secondary | ICD-10-CM | POA: Diagnosis not present

## 2023-11-01 NOTE — Patient Instructions (Addendum)
 [name] , Thank you for taking time to come for your Medicare Wellness Visit. I appreciate your ongoing commitment to your health goals. Please review the following plan we discussed and let me know if I can assist you in the future.   Referrals/Orders/Follow-Ups/Clinician Recommendations:   This is a list of the screening recommended for you and due dates:  Health Maintenance  Topic Date Due   DTaP/Tdap/Td vaccine (2 - Td or Tdap) 06/08/2023   COVID-19 Vaccine (7 - Mixed Product risk 2024-25 season) 12/05/2023   Flu Shot  01/27/2024   Complete foot exam   03/21/2024   Hemoglobin A1C  03/22/2024   Colon Cancer Screening  04/19/2024   Yearly kidney function blood test for diabetes  04/28/2024   Yearly kidney health urinalysis for diabetes  06/05/2024   Eye exam for diabetics  10/04/2024   Medicare Annual Wellness Visit  10/31/2024   Pneumonia Vaccine  Completed   Hepatitis C Screening  Completed   Zoster (Shingles) Vaccine  Completed   HPV Vaccine  Aged Out   Meningitis B Vaccine  Aged Out    Advanced directives: (Copy Requested) Please bring a copy of your health care power of attorney and living will to the office to be added to your chart at your convenience. You can mail to [facility] [address] [place], [place] [address] or email to [email or web address]  Next Medicare Annual Wellness Visit scheduled for next year: Yes  Have you seen your provider in the last 6 months (3 months if uncontrolled diabetes)? Yes

## 2023-11-01 NOTE — Addendum Note (Signed)
 Addended by: Dewayne Ford on: 11/01/2023 04:08 PM   Modules accepted: Level of Service
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
## 2023-11-15 ENCOUNTER — Ambulatory Visit (INDEPENDENT_AMBULATORY_CARE_PROVIDER_SITE_OTHER): Payer: Medicare PPO | Admitting: Internal Medicine

## 2023-11-15 ENCOUNTER — Encounter: Payer: Self-pay | Admitting: Internal Medicine

## 2023-11-15 VITALS — BP 122/66 | HR 82 | Temp 97.7°F | Resp 16 | Ht 72.0 in | Wt 220.2 lb

## 2023-11-15 DIAGNOSIS — I10 Essential (primary) hypertension: Secondary | ICD-10-CM

## 2023-11-15 DIAGNOSIS — Z0000 Encounter for general adult medical examination without abnormal findings: Secondary | ICD-10-CM

## 2023-11-15 DIAGNOSIS — E1142 Type 2 diabetes mellitus with diabetic polyneuropathy: Secondary | ICD-10-CM

## 2023-11-15 DIAGNOSIS — Z794 Long term (current) use of insulin: Secondary | ICD-10-CM

## 2023-11-15 DIAGNOSIS — E785 Hyperlipidemia, unspecified: Secondary | ICD-10-CM

## 2023-11-15 DIAGNOSIS — Z23 Encounter for immunization: Secondary | ICD-10-CM | POA: Diagnosis not present

## 2023-11-15 DIAGNOSIS — Z0001 Encounter for general adult medical examination with abnormal findings: Secondary | ICD-10-CM

## 2023-11-15 DIAGNOSIS — T148XXA Other injury of unspecified body region, initial encounter: Secondary | ICD-10-CM | POA: Diagnosis not present

## 2023-11-15 LAB — COMPREHENSIVE METABOLIC PANEL WITH GFR
ALT: 32 U/L (ref 0–53)
AST: 28 U/L (ref 0–37)
Albumin: 4.4 g/dL (ref 3.5–5.2)
Alkaline Phosphatase: 76 U/L (ref 39–117)
BUN: 37 mg/dL — ABNORMAL HIGH (ref 6–23)
CO2: 23 meq/L (ref 19–32)
Calcium: 9.2 mg/dL (ref 8.4–10.5)
Chloride: 103 meq/L (ref 96–112)
Creatinine, Ser: 1.43 mg/dL (ref 0.40–1.50)
GFR: 50.61 mL/min — ABNORMAL LOW (ref >60.00)
Glucose, Bld: 82 mg/dL (ref 70–99)
Potassium: 5.1 meq/L (ref 3.5–5.1)
Sodium: 134 meq/L — ABNORMAL LOW (ref 135–145)
Total Bilirubin: 0.6 mg/dL (ref 0.2–1.2)
Total Protein: 7.2 g/dL (ref 6.0–8.3)

## 2023-11-15 LAB — CBC WITH DIFFERENTIAL/PLATELET
Basophils Absolute: 0 10*3/uL (ref 0.0–0.1)
Basophils Relative: 0.4 % (ref 0.0–3.0)
Eosinophils Absolute: 0.2 10*3/uL (ref 0.0–0.7)
Eosinophils Relative: 3.8 % (ref 0.0–5.0)
HCT: 42.2 % (ref 39.0–52.0)
Hemoglobin: 14.1 g/dL (ref 13.0–17.0)
Lymphocytes Relative: 20.8 % (ref 12.0–46.0)
Lymphs Abs: 1.3 10*3/uL (ref 0.7–4.0)
MCHC: 33.4 g/dL (ref 30.0–36.0)
MCV: 90.8 fl (ref 78.0–100.0)
Monocytes Absolute: 0.6 10*3/uL (ref 0.1–1.0)
Monocytes Relative: 10.3 % (ref 3.0–12.0)
Neutro Abs: 4.1 10*3/uL (ref 1.4–7.7)
Neutrophils Relative %: 64.7 % (ref 43.0–77.0)
Platelets: 265 10*3/uL (ref 150.0–400.0)
RBC: 4.64 Mil/uL (ref 4.22–5.81)
RDW: 13.9 % (ref 11.5–15.5)
WBC: 6.3 10*3/uL (ref 4.0–10.5)

## 2023-11-15 NOTE — Assessment & Plan Note (Signed)
 Here for CPX  Other issues: DM: Per endocrinology.  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 Dyslipidemia: Last LDL 42 mg per chart review (09/20/2023).  Continue atorvastatin  Hypothyroidism: Few months ago TSH was 0.2, Synthroid  decreased from 175 mcg to 150 mcg.  Follow-up TSH 2.23 (08/26/23); TSH 0.3 (09/20/23) meds adjusted per ENDO. Nephrolithiasis: On allopurinol  Multiple scratches: Located on the arms, related to playing with his dog and yard work.  Tdap today. RTC 6 months.
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
## 2023-11-15 NOTE — Patient Instructions (Addendum)
 Vaccines I recommend: Flu shot every fall COVID booster if not done by 02-2023    Check the  blood pressure regularly Blood pressure goal:  between 110/65 and  135/85. If it is consistently higher or lower, let me know     GO TO THE LAB :  Get the blood work   Your results will be posted on MyChart with my comments  Next office visit for a checkup in 6 months Please make an appointment before you leave today

## 2023-11-15 NOTE — Assessment & Plan Note (Signed)
 Here for CPX -Td 2025  - PNM shot: 2015;  prevnar: 2015; PNM 20: 04-2021 - zostavax - 06-2016; s/p shingrix  x 2 - Vaccines I recommend: Tdap,   COVID booster if not done after 02-2023, flu shot every fall - PSAs per urology  -CCS: reports a Cscope at age 68, normal per patient; s/p cscope 06/2017, colonoscopy 03-2023.  Next per GI -Lifestyle: Very active, cycling frequently, yard work. -Labs: Reviewed, will get a CMP and CBC

## 2023-11-16 ENCOUNTER — Other Ambulatory Visit: Payer: Self-pay | Admitting: Family Medicine

## 2023-11-17 ENCOUNTER — Ambulatory Visit: Payer: Self-pay | Admitting: Internal Medicine

## 2023-11-28 DIAGNOSIS — R351 Nocturia: Secondary | ICD-10-CM | POA: Diagnosis not present

## 2023-11-28 DIAGNOSIS — N401 Enlarged prostate with lower urinary tract symptoms: Secondary | ICD-10-CM | POA: Diagnosis not present

## 2023-11-28 DIAGNOSIS — N200 Calculus of kidney: Secondary | ICD-10-CM | POA: Diagnosis not present

## 2023-12-06 ENCOUNTER — Ambulatory Visit: Admitting: Licensed Clinical Social Worker

## 2023-12-07 ENCOUNTER — Ambulatory Visit: Admitting: Licensed Clinical Social Worker

## 2023-12-07 DIAGNOSIS — F411 Generalized anxiety disorder: Secondary | ICD-10-CM | POA: Diagnosis not present

## 2023-12-07 DIAGNOSIS — F429 Obsessive-compulsive disorder, unspecified: Secondary | ICD-10-CM | POA: Diagnosis not present
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
## 2023-12-08 DIAGNOSIS — F411 Generalized anxiety disorder: Secondary | ICD-10-CM | POA: Insufficient documentation

## 2023-12-19 ENCOUNTER — Ambulatory Visit (INDEPENDENT_AMBULATORY_CARE_PROVIDER_SITE_OTHER): Admitting: Licensed Clinical Social Worker

## 2023-12-19 DIAGNOSIS — F429 Obsessive-compulsive disorder, unspecified: Secondary | ICD-10-CM | POA: Diagnosis not present

## 2023-12-19 DIAGNOSIS — F411 Generalized anxiety disorder: Secondary | ICD-10-CM

## 2023-12-19 DIAGNOSIS — F331 Major depressive disorder, recurrent, moderate: Secondary | ICD-10-CM | POA: Diagnosis not present

## 2023-12-20 NOTE — Progress Notes (Signed)
 [facility] Counselor/Therapist Progress Note  Patient ID: Leonard [name], MRN: [id number]    Date: 12/20/23  Time Spent: 0905  am - 1001 am : 56 Minutes  Treatment Type: Individual Therapy.  Reported Symptoms: Symptoms of anxiety, OCD, and facial tics   Mental Status Exam: Appearance:  Casual     Behavior: Appropriate  Motor: Normal  Speech/Language:  Clear and Coherent  Affect: Appropriate  Mood: normal  Thought process: normal  Thought content:   WNL  Sensory/Perceptual disturbances:   WNL  Orientation: oriented to person, place, time/date, situation, day of week, month of year, and year  Attention: Good  Concentration: Good  Memory: WNL  Fund of knowledge:  Good  Insight:   Good  Judgment:  Good  Impulse Control: Good    Risk Assessment: Danger to Self:  No Self-injurious Behavior: No Danger to Others: No Duty to Warn:no Physical Aggression / Violence:No  Access to Firearms a concern: No  Gang Involvement:No    Subjective:    Leonard [name] participated in person from office,located at [employer]. Leonard [name] consented to treatment. Therapist participated from office with patient present.    Leonard [name] presented for his session and was pleasant and cooperative.Leonard [name] reports that he continues to struggle with tics and feels that he gets on his wife's nerves. He reports that he is alone all day with the pets and his wife works long hours. He reports that once she gets home he wants to talk and his wife is not in the mood. Patient reports that he has been volunteering at a food pantry and that will help him with interacting with others. Patient states he remains concerned about his tics and feels that this is an area he has yet to find a resolution for.   Clinician actively listened and provided patient with feedback and additional information on tics and ideas for coping skills to assist with tics. Clinician processed with patient:  1.  Relaxation and Stress Management: Deep Breathing: Practicing deep, slow breaths can help calm the body and mind, reducing the urge to tic.  Mindfulness and Meditation: Engaging in mindfulness exercises can increase awareness of the body and mind, helping individuals notice and manage the premonitory urges that precede tics.  Progressive Muscle Relaxation: This technique involves tensing and releasing different muscle groups to promote relaxation.  Listen to Music: Calming music can be a helpful distraction and relaxation tool.  Visualization: Imagining a peaceful scene or experience can be a relaxing distraction.  2. Behavioral Therapies: Comprehensive Behavioral Intervention for Tics (CBIT): SABRA CBIT is a well-established therapy that combines awareness training, competing response training, and relaxation techniques to help individuals gain control over their tics.  Habit Reversal Training (HRT): SABRA HRT focuses on identifying the urge to tic and then performing a competing response that is physically incompatible with the tic.  Exposure and Response Prevention (ERP): SABRA ERP helps individuals tolerate the uncomfortable feelings that precede a tic, rather than immediately acting on the urge to tic.  3. Lifestyle Adjustments: Identify and Manage Triggers: Understanding what situations, emotions, or environments trigger tics can help in developing strategies to manage or avoid them.  Get Enough Sleep: Fatigue can worsen tics, so maintaining a regular sleep schedule is important.  Limit Caffeine and Sugar: Some individuals find that caffeine and sugar can exacerbate tics, so limiting or avoiding them may be helpful.  Exercise Regularly: Physical activity can be a healthy way to reduce stress and improve overall well-being.  4. Support and Education: Support Groups: Connecting with others who have tics can provide a sense of community and understanding.  Educate Others: Informing family,  friends, teachers, and employers about tics can help them understand the condition and reduce stigma.   Leonard [name] was fully engaged and took notes and interacted with Clinician during the session. Leonard [name] is motivated for treatment and voices his goals of improving his quality of life. Leonard [name] will continue to utilize coping skills and engage in bi weekly therapy sessions. Treatment planning will be reviewed by 12/06/2024.   Interventions: Cognitive Behavioral Therapy, Motivational Interviewing and psycho education. Clinician conducted session in person at clinician's office at [facility]. Reviewed events since last session. Assessed patient's mood since last session and current mood. Clinician reviewed diagnoses and treatment recommendations. Provided psycho education related to diagnoses and treatment.     Diagnosis: Generalized Anxiety Disorder, OCD, Major Depressive Disorder, recurrent, moderate   Damien Junk MSW, LCSW/DATE 12/19/2023

## 2023-12-22 ENCOUNTER — Encounter (HOSPITAL_BASED_OUTPATIENT_CLINIC_OR_DEPARTMENT_OTHER): Payer: Self-pay | Admitting: Pulmonary Disease

## 2023-12-23 DIAGNOSIS — G4733 Obstructive sleep apnea (adult) (pediatric): Secondary | ICD-10-CM | POA: Diagnosis not present

## 2023-12-23 DIAGNOSIS — I10 Essential (primary) hypertension: Secondary | ICD-10-CM | POA: Diagnosis not present

## 2024-01-04 ENCOUNTER — Ambulatory Visit (INDEPENDENT_AMBULATORY_CARE_PROVIDER_SITE_OTHER): Admitting: Licensed Clinical Social Worker

## 2024-01-04 DIAGNOSIS — F411 Generalized anxiety disorder: Secondary | ICD-10-CM | POA: Diagnosis not present

## 2024-01-04 DIAGNOSIS — F429 Obsessive-compulsive disorder, unspecified: Secondary | ICD-10-CM | POA: Diagnosis not present

## 2024-01-04 NOTE — Progress Notes (Signed)
 [facility] Counselor/Therapist Progress Note  Patient ID: Leonard [name], MRN: [id number]    Date: 01/04/24  Time Spent: 0906  am - 1003 am : 57 Minutes  Treatment Type: Individual Therapy.  Reported Symptoms: Symptoms of anxiety, OCD, and facial tics   Mental Status Exam: Appearance:  Casual     Behavior: Appropriate  Motor: Normal  Speech/Language:  Clear and Coherent  Affect: Appropriate  Mood: normal  Thought process: normal  Thought content:   WNL  Sensory/Perceptual disturbances:   WNL  Orientation: oriented to person, place, time/date, situation, day of week, month of year, and year  Attention: Good  Concentration: Good  Memory: WNL  Fund of knowledge:  Good  Insight:   Good  Judgment:  Good  Impulse Control: Good    Risk Assessment: Danger to Self:  No Self-injurious Behavior: No Danger to Others: No Duty to Warn:no Physical Aggression / Violence:No  Access to Firearms a concern: No  Gang Involvement:No    Subjective:    Leonard [name] participated in person from office,located at [employer]. Leonard [name] consented to treatment. Therapist participated from office with patient present.   Leonard [name] presented for his session in a positive mood. Leonard [name] identified that he continues to have the tics and reports that he stays frustrated at the tics and how they affect him.Leonard [name] reports that he stays busy but also allows himself to rest. Leonard [name] reported that he is working on not picking with his spouse as he knows she is tired. Patient reports that  he is volunteering and that helps him to feel better. Patient also reports that he find the socialization very helpful. Patient was able to identify that his anxiety does increase his tics and wants to be more aware of how to calm his anxiety.  Clinician actively listened and provided support and encouragement via verbal interaction. Clinician processed with patient how anxiety can increase the  tics and discussed additional coping skills :Set Boundaries : Recognize your limits. It's important to help others while ensuring you don't overextend yourself. Share Your Struggles : Sometimes, being open about your own challenges can create a sense of connection. Offer Small Acts of KindnessIdentify and Avoid Triggers: Recognize situations or events that tend to worsen tics and try to minimize exposure to them.  Relaxation Techniques: Practice deep breathing, visual imagery, or guided relaxation to reduce stress and anxiety.  Mindfulness: Engage in mindfulness exercises to improve awareness of your body and thoughts, helping to manage stress and reduce tic frequency.  Regular Exercise: Physical activity can help release pent-up energy and reduce stress levels, potentially leading to fewer tics.  Patient will continue to engage in bi weekly therapy sessions. Patient will utilize coping skills to assist with decreasing mental health symptoms. Patient was pleasant and cooperative and motivated for treatment. Treatment planning will be reviewed by 12/06/2024.  Interventions: Cognitive Behavioral Therapy, Dialectical Behavioral Therapy, Assertiveness/Communication, Motivational Interviewing, Solution-Oriented/Positive Psychology, and Insight-Oriented  Diagnosis: Generalized Anxiety Disorder, Obsessive Compulsive Disorder    Damien Junk MSW, LCSW/DATE 01/04/2024

## 2024-01-09 ENCOUNTER — Other Ambulatory Visit (HOSPITAL_BASED_OUTPATIENT_CLINIC_OR_DEPARTMENT_OTHER): Payer: Self-pay

## 2024-01-09 DIAGNOSIS — G4733 Obstructive sleep apnea (adult) (pediatric): Secondary | ICD-10-CM

## 2024-01-13 ENCOUNTER — Emergency Department (HOSPITAL_BASED_OUTPATIENT_CLINIC_OR_DEPARTMENT_OTHER)

## 2024-01-13 ENCOUNTER — Telehealth: Payer: Self-pay

## 2024-01-13 ENCOUNTER — Emergency Department (HOSPITAL_BASED_OUTPATIENT_CLINIC_OR_DEPARTMENT_OTHER)
Admission: EM | Admit: 2024-01-13 | Discharge: 2024-01-13 | Disposition: A | Discharge status: Home / Self Care | Attending: Emergency Medicine | Admitting: Emergency Medicine

## 2024-01-13 ENCOUNTER — Encounter (HOSPITAL_BASED_OUTPATIENT_CLINIC_OR_DEPARTMENT_OTHER): Payer: Self-pay

## 2024-01-13 ENCOUNTER — Other Ambulatory Visit: Payer: Self-pay

## 2024-01-13 DIAGNOSIS — R112 Nausea with vomiting, unspecified: Secondary | ICD-10-CM | POA: Diagnosis not present

## 2024-01-13 DIAGNOSIS — E860 Dehydration: Secondary | ICD-10-CM | POA: Diagnosis not present

## 2024-01-13 DIAGNOSIS — Z7982 Long term (current) use of aspirin: Secondary | ICD-10-CM | POA: Diagnosis not present

## 2024-01-13 DIAGNOSIS — Z794 Long term (current) use of insulin: Secondary | ICD-10-CM | POA: Diagnosis not present

## 2024-01-13 DIAGNOSIS — Z7984 Long term (current) use of oral hypoglycemic drugs: Secondary | ICD-10-CM | POA: Diagnosis not present

## 2024-01-13 DIAGNOSIS — R109 Unspecified abdominal pain: Secondary | ICD-10-CM | POA: Diagnosis not present

## 2024-01-13 DIAGNOSIS — N179 Acute kidney failure, unspecified: Secondary | ICD-10-CM | POA: Insufficient documentation

## 2024-01-13 DIAGNOSIS — N281 Cyst of kidney, acquired: Secondary | ICD-10-CM | POA: Diagnosis not present

## 2024-01-13 DIAGNOSIS — I700 Atherosclerosis of aorta: Secondary | ICD-10-CM | POA: Diagnosis not present

## 2024-01-13 DIAGNOSIS — K5730 Diverticulosis of large intestine without perforation or abscess without bleeding: Secondary | ICD-10-CM | POA: Diagnosis not present

## 2024-01-13 DIAGNOSIS — R748 Abnormal levels of other serum enzymes: Secondary | ICD-10-CM | POA: Insufficient documentation

## 2024-01-13 DIAGNOSIS — N200 Calculus of kidney: Secondary | ICD-10-CM | POA: Diagnosis not present

## 2024-01-13 DIAGNOSIS — I10 Essential (primary) hypertension: Secondary | ICD-10-CM | POA: Insufficient documentation

## 2024-01-13 LAB — CBC
HCT: 40.4 % (ref 39.0–52.0)
Hemoglobin: 13.8 g/dL (ref 13.0–17.0)
MCH: 30.7 pg (ref 26.0–34.0)
MCHC: 34.2 g/dL (ref 30.0–36.0)
MCV: 89.8 fL (ref 80.0–100.0)
Platelets: 298 K/uL (ref 150–400)
RBC: 4.5 MIL/uL (ref 4.22–5.81)
RDW: 13.8 % (ref 11.5–15.5)
WBC: 9.2 K/uL (ref 4.0–10.5)
nRBC: 0 % (ref 0.0–0.2)

## 2024-01-13 LAB — URINALYSIS, ROUTINE W REFLEX MICROSCOPIC
Bacteria, UA: NONE SEEN
Bilirubin Urine: NEGATIVE
Glucose, UA: >1000 mg/dL — AB
Hgb urine dipstick: NEGATIVE
Ketones, ur: NEGATIVE mg/dL
Leukocytes,Ua: NEGATIVE
Nitrite: NEGATIVE
Protein, ur: NEGATIVE mg/dL
Specific Gravity, Urine: 1.021 (ref 1.005–1.030)
pH: 5.5 (ref 5.0–8.0)

## 2024-01-13 LAB — COMPREHENSIVE METABOLIC PANEL WITH GFR
ALT: 50 U/L — ABNORMAL HIGH (ref 0–44)
AST: 37 U/L (ref 15–41)
Albumin: 4.3 g/dL (ref 3.5–5.0)
Alkaline Phosphatase: 80 U/L (ref 38–126)
Anion gap: 17 — ABNORMAL HIGH (ref 5–15)
BUN: 46 mg/dL — ABNORMAL HIGH (ref 8–23)
CO2: 18 mmol/L — ABNORMAL LOW (ref 22–32)
Calcium: 9.7 mg/dL (ref 8.9–10.3)
Chloride: 100 mmol/L (ref 98–111)
Creatinine, Ser: 1.84 mg/dL — ABNORMAL HIGH (ref 0.61–1.24)
GFR, Estimated: 40 mL/min — ABNORMAL LOW (ref >60)
Glucose, Bld: 126 mg/dL — ABNORMAL HIGH (ref 70–99)
Potassium: 4.6 mmol/L (ref 3.5–5.1)
Sodium: 135 mmol/L (ref 135–145)
Total Bilirubin: 0.2 mg/dL (ref 0.0–1.2)
Total Protein: 6.9 g/dL (ref 6.5–8.1)

## 2024-01-13 LAB — LIPASE, BLOOD: Lipase: 245 U/L — ABNORMAL HIGH (ref 11–51)

## 2024-01-13 LAB — TROPONIN T, HIGH SENSITIVITY
Troponin T High Sensitivity: 36 ng/L — ABNORMAL HIGH (ref <19)
Troponin T High Sensitivity: 33 ng/L — ABNORMAL HIGH (ref <19)

## 2024-01-13 MED ORDER — SODIUM CHLORIDE 0.9 % IV BOLUS
1000.0000 mL | Freq: Once | INTRAVENOUS | Status: AC
  Administered 2024-01-13: 1000 mL via INTRAVENOUS

## 2024-01-13 MED ORDER — ONDANSETRON HCL 4 MG/2ML IJ SOLN
4.0000 mg | Freq: Once | INTRAMUSCULAR | Status: DC
  Filled 2024-01-13: qty 2

## 2024-01-13 NOTE — Telephone Encounter (Signed)
 FYI. Pt scheduled appt on Tuesday, 7/22 regarding feeling weak, stomach upset, nausea for 10 days, Pt was sent to nurse triage, they recommended he be seen within 4 hours. Pt uncertain if he will go be seen.

## 2024-01-13 NOTE — Telephone Encounter (Addendum)
 Recommend to be seen today as recommended by the triage nurse

## 2024-01-13 NOTE — Telephone Encounter (Signed)
LMOM informing Pt of PCP recommendations.  

## 2024-01-13 NOTE — ED Provider Notes (Signed)
 [place] EMERGENCY DEPARTMENT AT [facility] Provider Note   CSN: [id number] Arrival date & time: 01/13/24  8096     Patient presents with: Fatigue   Leonard [name] is a 68 y.o. male.   Patient here with some general fatigue.  Nausea vomiting diarrhea for 1 week symptoms have improved.  Leonard [name] does work outside in the heat as well.  Denies any chest pain shortness of breath weakness numbness tingling otherwise.  No headache.  Leonard [name] is having some abdominal cramping at times.  No cough or sputum production.  History of high blood pressure anxiety depression OCD.  The history is provided by the patient.       Prior to Admission medications   Medication Sig Start Date End Date Taking? Authorizing Provider  albuterol  (VENTOLIN  HFA) 108 (90 Base) MCG/ACT inhaler Inhale 2 puffs into the lungs every 4 (four) hours as needed for wheezing or shortness of breath (chest tightness). Patient not taking: Reported on 11/15/2023 12/07/22   Kassie Acquanetta Bradley, MD  allopurinol  (ZYLOPRIM ) 300 MG tablet Take 300 mg by mouth daily.    [provider]  amphetamine -dextroamphetamine  (ADDERALL) 10 MG tablet Take 1 tablet (10 mg total) by mouth with breakfast, with lunch, and with evening meal. Patient taking differently: Take 5 mg by mouth with breakfast, with lunch, and with evening meal. 04/29/22   Hurst, Verneita DASEN, PA-C  aspirin  81 MG tablet Take 81 mg by mouth at bedtime.     [provider]  atorvastatin  (LIPITOR) 20 MG tablet TAKE ONE TABLET BY MOUTH DAILY 06/08/21   Paz, Jose E, MD  benazepril -hydrochlorthiazide (LOTENSIN  HCT) 20-25 MG tablet Take 1 tablet by mouth daily.    [provider]  Blood Glucose Monitoring Suppl (ONE TOUCH ULTRA SYSTEM KIT) w/Device KIT 1 kit by Does not apply route once. 09/11/15   Paz, Jose E, MD  chlordiazePOXIDE HCl (LIBRIUM PO) Take 1 each by mouth daily.    [provider]  cholecalciferol (VITAMIN D3) 25 MCG (1000 UNIT) tablet  Take 1,000 Units by mouth daily.    [provider]  Cyanocobalamin (B-12 PO) Take by mouth.    [provider]  Cyanocobalamin (VITAMIN B 12 PO) Take by mouth.    [provider]  EPINEPHrine  0.3 mg/0.3 mL IJ SOAJ injection Inject 0.3 mg into the muscle as needed for anaphylaxis. Patient not taking: Reported on 11/15/2023 02/17/23   Kehrli, Kelsey F, PA-C  insulin  degludec (TRESIBA FLEXTOUCH) 200 UNIT/ML FlexTouch Pen Inject 36 Units into the skin daily. 03/17/20   [provider]  Insulin  Pen Needle (BD PEN NEEDLE NANO U/F) 32G X 4 MM MISC TO USE WITH INSULIN  01/31/20   Berneta Elsie Sayre, MD  JARDIANCE 25 MG TABS tablet Take 1 tablet by mouth daily. 10/31/18   [provider]  Lancets JANETT ULTRASOFT) lancets Check blood sugar no more than twice daily 09/11/15   Paz, Jose E, MD  levothyroxine  (SYNTHROID ) 150 MCG tablet Take 1 tablet (150 mcg total) by mouth daily before breakfast. 06/07/23   Paz, Jose E, MD  MAGNESIUM GLYCINATE PO Take 500 mg by mouth daily.    [provider]  metFORMIN  (GLUCOPHAGE ) 1000 MG tablet Take 1,000 mg by mouth 2 (two) times daily with a meal. 03/06/20   Paz, Jose E, MD  Multiple Vitamin (MULTI-VITAMIN) tablet Take 1 tablet by mouth daily.    [provider]  Omega-3 Fatty Acids (FISH OIL) 1200 MG CPDR Take by mouth.  [provider]  [facility] ULTRA test strip CHECK BLOOD SUGAR NO MORE THAN TWICE DAILY 06/07/19   Berneta Elsie Sayre, MD  PARoxetine (PAXIL) 40 MG tablet Take 60 mg by mouth every morning.    [provider]  potassium citrate  (UROCIT-K ) 10 MEQ (1080 MG) SR tablet Take 1 tablet by mouth 2 (two) times daily with a meal. 05/25/21   [provider]  spironolactone  (ALDACTONE ) 50 MG tablet Take 1 tablet (50 mg total) by mouth daily. Patient taking differently: Take 40 mg by mouth daily. 04/04/23   Lavona Agent, MD  tamsulosin  (FLOMAX ) 0.4 MG CAPS Take 0.4 mg by  mouth at bedtime.    [provider]  tirzepatide CLOYDE) 12.5 MG/0.5ML Pen Inject 12.5 mg into the skin once a week.    [provider]    Allergies: Naproxen and Niacin    Review of Systems  Updated Vital Signs BP 125/81   Pulse 64   Temp 97.7 F (36.5 C) (Oral)   Resp 17   Ht 6' (1.829 m)   Wt 93 kg   SpO2 99%   BMI 27.80 kg/m   Physical Exam Vitals and nursing note reviewed.  Constitutional:      General: Leonard [name] is not in acute distress.    Appearance: Leonard [name] is well-developed. Leonard [name] is not ill-appearing.  HENT:     Head: Normocephalic and atraumatic.     Nose: Nose normal.     Mouth/Throat:     Mouth: Mucous membranes are moist.  Eyes:     Extraocular Movements: Extraocular movements intact.     Conjunctiva/sclera: Conjunctivae normal.     Pupils: Pupils are equal, round, and reactive to light.  Cardiovascular:     Rate and Rhythm: Normal rate and regular rhythm.     Heart sounds: No murmur heard. Pulmonary:     Effort: Pulmonary effort is normal. No respiratory distress.     Breath sounds: Normal breath sounds.  Abdominal:     General: Abdomen is flat.     Palpations: Abdomen is soft.     Tenderness: There is abdominal tenderness.  Musculoskeletal:        General: No swelling. Normal range of motion.     Cervical back: Normal range of motion and neck supple.  Skin:    General: Skin is warm and dry.     Capillary Refill: Capillary refill takes less than 2 seconds.  Neurological:     General: No focal deficit present.     Mental Status: Leonard [name] is alert and oriented to person, place, and time.     Cranial Nerves: No cranial nerve deficit.     Sensory: No sensory deficit.     Motor: No weakness.     Coordination: Coordination normal.     Comments: 5+ out of 5 strength throughout, normal sensation, no drift, normal finger-nose-finger, normal speech  Psychiatric:        Mood and Affect: Mood normal.     (all labs ordered are listed, but only  abnormal results are displayed) Labs Reviewed  LIPASE, BLOOD - Abnormal; Notable for the following components:      Result Value   Lipase 245 (*)    All other components within normal limits  COMPREHENSIVE METABOLIC PANEL WITH GFR - Abnormal; Notable for the following components:   CO2 18 (*)    Glucose, Bld 126 (*)    BUN 46 (*)    Creatinine, Ser 1.84 (*)    ALT  50 (*)    GFR, Estimated 40 (*)    Anion gap 17 (*)    All other components within normal limits  URINALYSIS, ROUTINE W REFLEX MICROSCOPIC - Abnormal; Notable for the following components:   Glucose, UA >1,000 (*)    All other components within normal limits  TROPONIN T, HIGH SENSITIVITY - Abnormal; Notable for the following components:   Troponin T High Sensitivity 36 (*)    All other components within normal limits  TROPONIN T, HIGH SENSITIVITY - Abnormal; Notable for the following components:   Troponin T High Sensitivity 33 (*)    All other components within normal limits  CBC    EKG: EKG Interpretation Date/Time:  Friday January 13 2024 19:53:05 EDT Ventricular Rate:  68 PR Interval:  136 QRS Duration:  102 QT Interval:  390 QTC Calculation: 415 R Axis:   45  Text Interpretation: Sinus rhythm Confirmed by Ruthe Cornet [phone]) on 01/13/2024 8:23:40 PM  Radiology: CT ABDOMEN PELVIS WO CONTRAST Result Date: 01/13/2024 EXAM: CT ABDOMEN AND PELVIS WITHOUT CONTRAST 01/13/2024 09:07:10 PM TECHNIQUE: CT of the abdomen and pelvis was performed without the administration of intravenous contrast. Multiplanar reformatted images are provided for review. Automated exposure control, iterative reconstruction, and/or weight based adjustment of the mA/kV was utilized to reduce the radiation dose to as low as reasonably achievable. COMPARISON: 02/08/2020 CLINICAL HISTORY: Abdominal pain, acute, nonlocalized. Pt reports abd pain w/ N/V/D x1 week. Pt reports Leonard [name] has felt increased lethargy and fatigue x1 week. FINDINGS: LOWER CHEST: No  acute abnormality. Mild degenerative changes of the lower thoracic spine. LIVER: The liver is unremarkable. GALLBLADDER AND BILE DUCTS: Gallbladder is unremarkable. No biliary ductal dilatation. SPLEEN: No acute abnormality. PANCREAS: No acute abnormality. ADRENAL GLANDS: No acute abnormality. KIDNEYS, URETERS AND BLADDER: 3 nonobstructing right renal calculi measuring up to 3 mm. Additional 3 mm nonobstructing left lower pole renal calculus. No ureteral or bladder calculi. No hydronephrosis. Small simple bilateral renal cysts, measuring up to 15 mm in the left upper pole, benign (Bosniak 1). No follow-up is recommended. 8 mm hemorrhagic left upper pole renal cyst, benign (Bosniak 2). No follow-up is recommended. GI AND BOWEL: Normal appendix. Sigmoid diverticulosis, without evidence of diverticulitis. Stomach demonstrates no acute abnormality. There is no bowel obstruction. No bowel wall thickening. PERITONEUM AND RETROPERITONEUM: No ascites. No free air. VASCULATURE: Atherosclerotic calcifications of the abdominal aorta and branch vessels. LYMPH NODES: No lymphadenopathy. REPRODUCTIVE ORGANS: Prostatomegaly, with enlargement of the central gland indenting the base of the bladder, suggesting BPH. BONES AND SOFT TISSUES: No acute osseous abnormality. No focal soft tissue abnormality. IMPRESSION: 1. No acute findings. 2. Small bilateral nonobstructing renal calculi measuring up to 3 mm. No hydronephrosis. 3. Sigmoid diverticulosis, without evidence of diverticulitis. 4. Prostatomegaly, suggesting BPH. Electronically signed by: Pinkie Pebbles MD 01/13/2024 09:16 PM EDT RP Workstation: [id number]   DG Chest Portable 1 View Result Date: 01/13/2024 CLINICAL DATA:  Nausea vomiting diarrhea EXAM: PORTABLE CHEST 1 VIEW COMPARISON:  12/11/2014 FINDINGS: The heart size and mediastinal contours are within normal limits. Both lungs are clear. The visualized skeletal structures are unremarkable. Aortic atherosclerosis.  IMPRESSION: No active disease. Electronically Signed   By: Luke Bun M.D.   On: 01/13/2024 20:30     Procedures   Medications Ordered in the ED  ondansetron  (ZOFRAN ) injection 4 mg (4 mg Intravenous Patient Refused/Not Given 01/13/24 1940)  sodium chloride  0.9 % bolus 1,000 mL (0 mLs Intravenous Stopped 01/13/24 2059)  sodium chloride  0.9 %  bolus 1,000 mL (1,000 mLs Intravenous New Bag/Given 01/13/24 2124)                                    Medical Decision Making Amount and/or Complexity of Data Reviewed Labs: ordered. Radiology: ordered.  Risk Prescription drug management.   LIANDRO THELIN is here with generalized weakness fatigue nausea vomit diarrhea.  Normal vitals.  No fever.  History of diabetes anxiety depression.  High cholesterol.  OCD.  Differential diagnosis likely dehydration.  Will check for electrolyte abnormality AKI will check for intra-abdominal process including colitis.  Will check for cardiac process with troponin EKG chest x-ray.  IV fluids to be given.  IV Zofran  given.  EKG shows sinus rhythm.  No ischemic changes.  Urinalysis negative for infection.  Lipase is 245 but CT scan showed no evidence of pancreatitis.  Creatinine mildly elevated above baseline to 1.84.  Suspect that this is from dehydration.  Initial troponin 36.  Likely from CKD and AKI.  EKG is reassuring.  Will repeat troponin.  CT scan otherwise was unremarkable.  While awaiting the second troponin will give Leonard [name] another liter of IV fluids.  My suspicion is that symptoms are secondary to dehydration from diarrhea and heat.  Repeat troponin decreased to 33.  Leonard [name] feels much better after IV fluids.  Blood pressures improved.  Overall I do suspect mild dehydration.  Will have Leonard [name] follow-up with nephrology primary care told to return if symptoms worsen.  Discharge.  This chart was dictated using voice recognition software.  Despite best efforts to proofread,  errors can occur which can change  the documentation meaning.      Final diagnoses:  AKI (acute kidney injury) [facility])  Dehydration    ED Discharge Orders     None          Ruthe Cornet, DO 01/13/24 2206

## 2024-01-13 NOTE — ED Notes (Signed)
 Reviewed discharge instructions and follow up care with pt. Pt verbalized understanding and had no further questions. Pt exited ED without complications.

## 2024-01-13 NOTE — Telephone Encounter (Signed)
 Initial Comment Caller has upset stomach, feeling weak and nauseas. Caller states every time he eats his stomach hurts. Pt states these symptoms have been going on for 7 days. Translation No Nurse Assessment Nurse: V[place]a, RN, Jon Date/Time (Eastern Time): 01/13/2024 3:23:55 PM Confirm and document reason for call. If symptomatic, describe symptoms. ---Caller states he has been having abdominal pain after eating for the last 7 days. No vomiting and no diarrhea. He c/o weakness and being nauseated Does the patient have any new or worsening symptoms? ---Yes Will a triage be completed? ---Yes Related visit to physician within the last 2 weeks? ---No Does the PT have any chronic conditions? (i.e. diabetes, asthma, this includes High risk factors for pregnancy, etc.) ---Yes List chronic conditions. ---diabetes. hypertension Is this a behavioral health or substance abuse call? ---No Guidelines Guideline Title Affirmed Question Affirmed Notes Nurse Date/Time (Eastern Time) Weakness (Generalized) and Fatigue [1] MODERATE weakness (i.e., interferes with work, school, normal activities) AND [2] cause unknown (Exceptions: Weakness from [place], Paramedic 01/13/2024 3:26:00 PM PLEASE NOTE: All timestamps contained within this report are represented as [place] Standard Time. CONFIDENTIALTY NOTICE: This fax transmission is intended only for the addressee. It contains information that is legally privileged, confidential or otherwise protected from use or disclosure. If you are not the intended recipient, you are strictly prohibited from reviewing, disclosing, copying using or disseminating any of this information or taking any action in reliance on or regarding this information. If you have received this fax in error, please notify us  immediately by telephone so that we can arrange for its return to us . Phone: [phone], Toll-Free: [phone], Fax:  [phone] [email or web address] 08-08-1955 Page: 1 of2 CallId: [id number] Guidelines Guideline Title Affirmed Question Affirmed Notes Nurse Date/Time Titus Time) acute minor illness or poor fluid intake; weakness is chronic and not worse.) Disp. Time Titus Time) Disposition Final User 01/13/2024 3:21:33 PM Send to Urgent Queue [place] Led 01/13/2024 3:31:41 PM See HCP within 4 Hours (or PCP triage) Yes Vinita, RN, Jon Final Disposition 01/13/2024 3:31:41 PM See HCP within 4 Hours (or PCP triage) Yes Vinita, RN, Jon [place] Disagree/Comply Comply Caller Understands Yes PreDisposition Did not know what to do Care Advice Given Per Guideline SEE HCP (OR PCP TRIAGE) WITHIN 4 HOURS: * IF OFFICE WILL BE CLOSED AND NO PCP (PRIMARY CARE PROVIDER) SECOND-LEVEL TRIAGE: You need to be seen within the next 3 or 4 hours. A nearby Urgent Care Center [facility]) is often a good source of care. Another choice is to go to the ED. Go sooner if you become worse. CALL BACK IF: * You become worse CARE ADVICE given per Weakness (Generalized) and Fatigue (Adult) guideline. Referrals GO TO FACILITY UNDECIDED

## 2024-01-13 NOTE — Telephone Encounter (Signed)
 Okay, thank you!

## 2024-01-13 NOTE — ED Notes (Signed)
 ED Provider at bedside.

## 2024-01-13 NOTE — ED Triage Notes (Signed)
 Pt reports abd pain w/ N/V/D x1 week. Pt reports he has felt increased lethargy and fatigue x1 week. Pt reports taking his BP today and was 92/50. Pt CA&O x4. VSS in triage.

## 2024-01-16 NOTE — Telephone Encounter (Signed)
 Pt seen at ED

## 2024-01-17 ENCOUNTER — Encounter: Payer: Self-pay | Admitting: Internal Medicine

## 2024-01-17 ENCOUNTER — Ambulatory Visit: Payer: Self-pay | Admitting: Internal Medicine

## 2024-01-17 ENCOUNTER — Ambulatory Visit: Admitting: Internal Medicine

## 2024-01-17 VITALS — BP 124/72 | HR 72 | Temp 98.0°F | Resp 16 | Ht 72.0 in | Wt 216.5 lb

## 2024-01-17 DIAGNOSIS — E1142 Type 2 diabetes mellitus with diabetic polyneuropathy: Secondary | ICD-10-CM

## 2024-01-17 DIAGNOSIS — I10 Essential (primary) hypertension: Secondary | ICD-10-CM

## 2024-01-17 DIAGNOSIS — Z794 Long term (current) use of insulin: Secondary | ICD-10-CM

## 2024-01-17 DIAGNOSIS — R110 Nausea: Secondary | ICD-10-CM

## 2024-01-17 DIAGNOSIS — N179 Acute kidney failure, unspecified: Secondary | ICD-10-CM | POA: Diagnosis not present

## 2024-01-17 DIAGNOSIS — K529 Noninfective gastroenteritis and colitis, unspecified: Secondary | ICD-10-CM | POA: Diagnosis not present

## 2024-01-17 LAB — MICROALBUMIN / CREATININE URINE RATIO
Creatinine,U: 100.3 mg/dL
Microalb Creat Ratio: 66.6 mg/g — ABNORMAL HIGH (ref 0.0–30.0)
Microalb, Ur: 6.7 mg/dL — ABNORMAL HIGH (ref 0.0–1.9)

## 2024-01-17 LAB — BASIC METABOLIC PANEL WITH GFR
BUN: 28 mg/dL — ABNORMAL HIGH (ref 6–23)
CO2: 29 meq/L (ref 19–32)
Calcium: 9.1 mg/dL (ref 8.4–10.5)
Chloride: 102 meq/L (ref 96–112)
Creatinine, Ser: 1.28 mg/dL (ref 0.40–1.50)
GFR: 57.74 mL/min — ABNORMAL LOW (ref >60.00)
Glucose, Bld: 114 mg/dL — ABNORMAL HIGH (ref 70–99)
Potassium: 4.7 meq/L (ref 3.5–5.1)
Sodium: 137 meq/L (ref 135–145)

## 2024-01-17 NOTE — Patient Instructions (Addendum)
 Continue holding spironolactone  and benazepril -hydrochlorothiazide .  Check your blood pressure once or twice daily.  Blood pressure goal:  between 110/65 and  135/85.  I anticipate that your blood pressure will start increasing in the next few days.  Once is consistently higher than 135, restart both benazepril -hydrochlorothiazide  and spironolactone .  Please reach out if you have any questions    GO TO THE LAB :  Get the blood work   Your results will be posted on MyChart with my comments  You have an appointment with me in November, see you then.  Call sooner if needed
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
## 2024-01-17 NOTE — Assessment & Plan Note (Signed)
 Gastroenteritis: Presented to the ER with nausea vomiting and diarrhea, he was dehydrated, creatinine elevated, lipase slightly high but CT did not show pancreatitis. He is feeling much better. He thinks is related to Mounjaro, this is the second time he has GI symptoms 2 days after Mounjaro injection. He is currently holding Aldactone  and benazepril  HCTZ. Plan: Good hydration, BMP. Monitor BPs closely, restart Aldactone  and benazepril  once BPs are consistently more than 135.  See AVS. Also rec to d/w  endocrinology if is appropriate to go back on Mounjaro, patient suspect this could has been a side effect. HTN: See above DM: See above RTC scheduled for November.

## 2024-01-18 ENCOUNTER — Ambulatory Visit (INDEPENDENT_AMBULATORY_CARE_PROVIDER_SITE_OTHER): Admitting: Licensed Clinical Social Worker

## 2024-01-18 DIAGNOSIS — F429 Obsessive-compulsive disorder, unspecified: Secondary | ICD-10-CM | POA: Diagnosis not present

## 2024-01-18 DIAGNOSIS — F411 Generalized anxiety disorder: Secondary | ICD-10-CM

## 2024-01-18 NOTE — Progress Notes (Signed)
 [facility] Counselor/Therapist Progress Note  Patient ID: CLAXTON LEVITZ, MRN: [id number]    Date: 01/18/24  Time Spent: 0903  am - 1000 am : 57 Minutes  Treatment Type: Individual Therapy.  Reported Symptoms: Symptoms of anxiety, OCD, and facial tics   Mental Status Exam: Appearance:  Casual     Behavior: Appropriate  Motor: Normal  Speech/Language:  Clear and Coherent  Affect: Appropriate  Mood: normal  Thought process: normal  Thought content:   WNL  Sensory/Perceptual disturbances:   WNL  Orientation: oriented to person, place, time/date, situation, day of week, month of year, and year  Attention: Good  Concentration: Good  Memory: WNL  Fund of knowledge:  Good  Insight:   Good  Judgment:  Good  Impulse Control: Good    Risk Assessment: Danger to Self:  No Self-injurious Behavior: No Danger to Others: No Duty to Warn:no Physical Aggression / Violence:No  Access to Firearms a concern: No  Gang Involvement:No    Subjective:    [name] participated in person from office,located at [employer]. Evander consented to treatment. Therapist participated from office with patient present.   Nijee presented for his session reporting that he has been sick with stomach issues fro about two weeks. He reports that he has spent his days in bed and feeling terrible. Patient reports that he believes it is his Monjaro and plans to have his doctor look at the medication and if they should make a change. Patient reports that he has felt an increase in anxiety and depressive symptoms. Patient was insightful and identified that is likely due to his physical condition. [name] shared that he often researches his condition and focusses on treatments. [name] states that he needs to find ways to not be so focussed on his situation and to prevent racing thoughts.  Ranell was fully engaged in discussion and motivated for treatment. [name] and Clinician  discussed finding a audio book that is enjoyable and not related to his mental health to listen to. We also discussed listening to music when he is trying to complete task to help him remain calm and prevent his mind from getting to many things going at one time. [name] will utilize coping skills and continue to engage in bi weekly therapy sessions. Treatment planning to be reviewed by 12/06/2024.  Interventions: Cognitive Behavioral Therapy, Dialectical Behavioral Therapy, and Solution-Oriented/Positive Psychology  Diagnosis: Generalized Anxiety Disorder, Obsessive Compulsive Disorder    Damien Junk MSW, LCSW/DATE 01/18/2024

## 2024-01-27 DIAGNOSIS — E1165 Type 2 diabetes mellitus with hyperglycemia: Secondary | ICD-10-CM | POA: Diagnosis not present

## 2024-01-27 DIAGNOSIS — E890 Postprocedural hypothyroidism: Secondary | ICD-10-CM | POA: Diagnosis not present

## 2024-01-27 DIAGNOSIS — Z794 Long term (current) use of insulin: Secondary | ICD-10-CM | POA: Diagnosis not present

## 2024-02-01 ENCOUNTER — Ambulatory Visit: Admitting: Licensed Clinical Social Worker

## 2024-02-01 DIAGNOSIS — F411 Generalized anxiety disorder: Secondary | ICD-10-CM

## 2024-02-01 DIAGNOSIS — F331 Major depressive disorder, recurrent, moderate: Secondary | ICD-10-CM

## 2024-02-01 DIAGNOSIS — F429 Obsessive-compulsive disorder, unspecified: Secondary | ICD-10-CM

## 2024-02-01 NOTE — Progress Notes (Signed)
 [facility] Counselor/Therapist Progress Note  Patient ID: CALISTRO RAUF, MRN: [id number]    Date: 02/01/24  Time Spent: 0904  am - 1000 am : 56 Minutes  Treatment Type: Individual Therapy.  Reported Symptoms: Symptoms of anxiety, OCD, and facial tics   Mental Status Exam: Appearance:  Casual     Behavior: Appropriate  Motor: Normal  Speech/Language:  Clear and Coherent  Affect: Appropriate  Mood: normal  Thought process: normal  Thought content:   WNL  Sensory/Perceptual disturbances:   WNL  Orientation: oriented to person, place, time/date, situation, day of week, month of year, and year  Attention: Good  Concentration: Good  Memory: WNL  Fund of knowledge:  Good  Insight:   Good  Judgment:  Good  Impulse Control: Good    Risk Assessment: Danger to Self:  No Self-injurious Behavior: No Danger to Others: No Duty to Warn:no Physical Aggression / Violence:No  Access to Firearms a concern: No  Gang Involvement:No    Subjective:    [name] participated in person from office,located at [employer]. Breandan consented to treatment. Therapist participated from office with patient present.   [name] presented for his session reporting that he is thankful he has Clinician to talk to and for her to listen. Patient reports that his friends avoid the topic and his wife at times gets frustrated. Patient states that he at times just needs someone to listen. Patient states that he has not been getting his  list of to do's completed and finds himself inside watching documentaries or listening to pod cast. [name] states that he has trouble motivating himself to complete task and gets anxious easily.  Clinician actively listened and  discussed with patient his concerns. Clinician provided positive support and encouragement. Clinician and patient processed grounding and mindfulness as well as discussed use of the ADHD timer that Clinician provided  for patient. Patient was challenged to complete 4 things on his list over the next few weeks. We processed time for relaxation and work as a balance and that those are things to put on his list as well.   [name] was pleasant and cooperative. [name] is motivated for treatment and always takes notes and is eager to learn new ideas to help decrease hi symptoms. [name] will continue to utilize coping skills to decrease his anxiety and tics. He will continue to engage in bi weekly therapy sessions. Treatment planning to be reviewed by 12/06/2024.   Interventions: Cognitive Behavioral Therapy, Dialectical Behavioral Therapy, Motivational Interviewing, and Solution-Oriented/Positive Psychology  Diagnosis: Generalized Anxiety Disorder, Major Depressive Disorder, recurrent, moderate, OCD unspecified   Damien Junk MSW, LCSW/DATE 02/01/2024

## 2024-02-08 DIAGNOSIS — I10 Essential (primary) hypertension: Secondary | ICD-10-CM | POA: Diagnosis not present

## 2024-02-08 DIAGNOSIS — G4733 Obstructive sleep apnea (adult) (pediatric): Secondary | ICD-10-CM | POA: Diagnosis not present

## 2024-02-15 ENCOUNTER — Ambulatory Visit (INDEPENDENT_AMBULATORY_CARE_PROVIDER_SITE_OTHER): Admitting: Licensed Clinical Social Worker

## 2024-02-15 DIAGNOSIS — F411 Generalized anxiety disorder: Secondary | ICD-10-CM

## 2024-02-15 DIAGNOSIS — F331 Major depressive disorder, recurrent, moderate: Secondary | ICD-10-CM | POA: Insufficient documentation

## 2024-02-15 DIAGNOSIS — F429 Obsessive-compulsive disorder, unspecified: Secondary | ICD-10-CM
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
## 2024-02-16 DIAGNOSIS — I701 Atherosclerosis of renal artery: Secondary | ICD-10-CM | POA: Diagnosis not present

## 2024-02-16 DIAGNOSIS — E1122 Type 2 diabetes mellitus with diabetic chronic kidney disease: Secondary | ICD-10-CM | POA: Diagnosis not present

## 2024-02-16 DIAGNOSIS — E785 Hyperlipidemia, unspecified: Secondary | ICD-10-CM | POA: Diagnosis not present

## 2024-02-16 DIAGNOSIS — N200 Calculus of kidney: Secondary | ICD-10-CM | POA: Diagnosis not present

## 2024-02-16 DIAGNOSIS — N179 Acute kidney failure, unspecified: Secondary | ICD-10-CM | POA: Diagnosis not present

## 2024-02-16 DIAGNOSIS — N1831 Chronic kidney disease, stage 3a: Secondary | ICD-10-CM | POA: Diagnosis not present

## 2024-02-16 DIAGNOSIS — I129 Hypertensive chronic kidney disease with stage 1 through stage 4 chronic kidney disease, or unspecified chronic kidney disease: Secondary | ICD-10-CM | POA: Diagnosis not present

## 2024-02-16 LAB — PROTEIN / CREATININE RATIO, URINE
Albumin, U: 34.6
Creatinine, Urine: 107.2

## 2024-02-16 LAB — MICROALBUMIN / CREATININE URINE RATIO: Microalb Creat Ratio: 32

## 2024-02-20 ENCOUNTER — Encounter: Payer: Self-pay | Admitting: Internal Medicine

## 2024-02-20 DIAGNOSIS — G2401 Drug induced subacute dyskinesia: Secondary | ICD-10-CM

## 2024-02-20 NOTE — Addendum Note (Signed)
 Addended by: Alvon Nygaard D on: 02/20/2024 04:41 PM   Modules accepted: Orders

## 2024-02-20 NOTE — Telephone Encounter (Signed)
 Neurology referral. Diagnosis tardive dyskinesia, rule out

## 2024-02-20 NOTE — Telephone Encounter (Signed)
 Referral placed.

## 2024-02-28 ENCOUNTER — Encounter: Payer: Self-pay | Admitting: Internal Medicine

## 2024-02-29 ENCOUNTER — Ambulatory Visit: Admitting: Licensed Clinical Social Worker

## 2024-03-14 ENCOUNTER — Ambulatory Visit: Admitting: Licensed Clinical Social Worker

## 2024-03-14 DIAGNOSIS — F331 Major depressive disorder, recurrent, moderate: Secondary | ICD-10-CM

## 2024-03-14 DIAGNOSIS — F411 Generalized anxiety disorder: Secondary | ICD-10-CM | POA: Diagnosis not present

## 2024-03-14 DIAGNOSIS — F429 Obsessive-compulsive disorder, unspecified: Secondary | ICD-10-CM

## 2024-03-14 NOTE — Progress Notes (Signed)
 [facility] Counselor/Therapist Progress Note  Patient ID: Leonard [name], MRN: [id number]    Date: 03/14/24  Time Spent: 0900  am - 0958 am : 58 Minutes  Treatment Type: Individual Therapy.  Reported Symptoms: Symptoms of anxiety, OCD, and facial tics   Mental Status Exam: Appearance:  Casual     Behavior: Appropriate  Motor: Normal  Speech/Language:  Clear and Coherent  Affect: Appropriate  Mood: normal  Thought process: normal  Thought content:   WNL  Sensory/Perceptual disturbances:   WNL  Orientation: oriented to person, place, time/date, situation, day of week, month of year, and year  Attention: Good  Concentration: Good  Memory: WNL  Fund of knowledge:  Good  Insight:   Good  Judgment:  Good  Impulse Control: Good    Risk Assessment: Danger to Self:  No Self-injurious Behavior: No Danger to Others: No Duty to Warn:no Physical Aggression / Violence:No  Access to Firearms a concern: No  Gang Involvement:No    Subjective:    Leonard [name] participated via video from his home. Clinician presented from office,located at [facility]. Leonard [name] consented to treatment.    Leonard [name] presented for his session stating that he has weaned himself off of all of his Psychotropic medications. Leonard [name] reports that he did not consult the doctor but did it according to how he had been told to do it in the past. Patient states that he researched Tardive Dyskinsia and feels that it was a factor in his Tics. Leonard [name] reports that he has now 90% less tics than before. Leonard [name] reports that he continues to struggle with loneliness and depression. He reports that his wife is always gone and bare interacts when home due to being tired. Leonard [name] reports that he wants to improve his marriage and spend more time together.    Clinician actively listened and provided encouragement and support via active listening and verbal feedback. Clinician processed with patient  his concerns and discussed the importance of consulting with his provider about his medication taper. Clinician encouraged patient to incorporate healthy communication techniques into his interactions with his spouse. Clinician also processed with his patient his thoughts on micr dosing of psychodelic mushrooms. Clinician discouraged use of anything not been approved by the FDA or his provider. Clinician encouraged patient to speak to his provider when he meets with him this week.  Leonard [name] was pleasant and cooperative, and engaged in discussion. Leonard [name] was motivated for treatment as evidenced by his being eager to research alternative methods for treatment and as well as side effects of his medications. Leonard [name] will Verbally express understanding of the relationship between feelings of depression, anxiety and their impact on thinking patterns and behaviors. Leonard [name] will verbalize an understanding of the role that distorted thinking plays in creating fears, excessive worry, and ruminations. Patient is to use CBT, mindfulness and coping skills to help manage decrease symptoms associated with their diagnosis. Treatment plan to be reviewed by 12/06/2024.   Interventions: Cognitive Behavioral Therapy, Dialectical Behavioral Therapy, Mindfulness Meditation, and Solution-Oriented/Positive Psychology  Diagnosis: Generalized Anxiety Disorder, Major Depressive Disorder, recurrent moderate, OCD    Damien Junk MSW, LCSW/DATE 03/14/2024

## 2024-03-28 ENCOUNTER — Ambulatory Visit: Admitting: Licensed Clinical Social Worker

## 2024-04-18 ENCOUNTER — Ambulatory Visit: Admitting: Licensed Clinical Social Worker

## 2024-04-30 DIAGNOSIS — D225 Melanocytic nevi of trunk: Secondary | ICD-10-CM | POA: Diagnosis not present

## 2024-04-30 DIAGNOSIS — L821 Other seborrheic keratosis: Secondary | ICD-10-CM | POA: Diagnosis not present

## 2024-04-30 DIAGNOSIS — L738 Other specified follicular disorders: Secondary | ICD-10-CM | POA: Diagnosis not present

## 2024-04-30 DIAGNOSIS — L570 Actinic keratosis: Secondary | ICD-10-CM | POA: Diagnosis not present

## 2024-05-16 ENCOUNTER — Ambulatory Visit (INDEPENDENT_AMBULATORY_CARE_PROVIDER_SITE_OTHER): Admitting: Licensed Clinical Social Worker

## 2024-05-16 DIAGNOSIS — F331 Major depressive disorder, recurrent, moderate: Secondary | ICD-10-CM

## 2024-05-16 DIAGNOSIS — F411 Generalized anxiety disorder: Secondary | ICD-10-CM

## 2024-05-16 DIAGNOSIS — F429 Obsessive-compulsive disorder, unspecified: Secondary | ICD-10-CM | POA: Diagnosis not present

## 2024-05-16 NOTE — Progress Notes (Signed)
 [facility] Counselor/Therapist Progress Note  Patient ID: Leonard [name], MRN: [id number]    Date: 05/16/24  Time Spent: 902  am - 1000 am : 58 Minutes  Treatment Type: Individual Therapy.  Treatment Type: Individual Therapy.   Reported Symptoms: Symptoms of anxiety, OCD, and facial tics   Mental Status Exam: Appearance:  Casual     Behavior: Appropriate  Motor: Normal  Speech/Language:  Clear and Coherent  Affect: Appropriate  Mood: normal  Thought process: normal  Thought content:   WNL  Sensory/Perceptual disturbances:   WNL  Orientation: oriented to person, place, time/date, situation, day of week, month of year, and year  Attention: Good  Concentration: Good  Memory: WNL  Fund of knowledge:  Good  Insight:   Good  Judgment:  Good  Impulse Control: Good    Risk Assessment: Danger to Self:  No Self-injurious Behavior: No Danger to Others: No Duty to Warn:no Physical Aggression / Violence:No  Access to Firearms a concern: No  Gang Involvement:No    Subjective:    Leonard [name] participated via video from his home. Clinician presented from office,located at [facility]. Leonard [name] consented to treatment.   Leonard [name] presented for his session stating he has weaned himself off of his medication. He states that he is sleeping better with melatonin. He reports that his wife is not as naughty with him and he feels that they are getting better. He states that his wife has become a boring and inactive woman. He reports that he is hopeful that things will improve once she retires in January. He states that she has said that she has agreed to do more once she retires. Patient reports that he has decreased by 70% in his tics. He states he is aware of them and he begins to focus on not doing them. He states that he is using mindfulness as a coping skill. He reports trying to distract himself with activities.  Patient reports that  he would rate his  depression as moderate. Patient states that the anxiety is also still there but he would rate it decreased by 70% and the tics by 80%.  Clinician actively listened and provided support and encouragement. Clinician processed with patient additional coping skills for his depression and anxiety. We processed Coping with depression and anxiety without medication involves lifestyle changes like regular exercise, prioritizing sleep, and maintaining a healthy diet. Other strategies include practicing stress-reduction techniques such as meditation and deep breathing, building a strong social support system, and engaging in pleasant activities to boost your mood.   Leonard [name] was pleasant and cooperative. Leonard [name] is motivated for treatment and always takes notes and is eager to learn new ideas to help decrease hi symptoms. Leonard [name] will continue to utilize coping skills to decrease his anxiety and tics. He will continue to engage in bi weekly therapy sessions. Treatment planning to be reviewed by 12/06/2024.     Interventions: Cognitive Behavioral Therapy, Dialectical Behavioral Therapy, Motivational Interviewing, and Solution-Oriented/Positive Psychology   Diagnosis: Generalized Anxiety Disorder, Major Depressive Disorder, recurrent, moderate, OCD unspecified     Damien Junk MSW, LCSW/DATE 05/16/2024

## 2024-05-21 ENCOUNTER — Ambulatory Visit: Admitting: Internal Medicine

## 2024-05-21 ENCOUNTER — Ambulatory Visit: Admitting: Licensed Clinical Social Worker

## 2024-06-13 ENCOUNTER — Ambulatory Visit (INDEPENDENT_AMBULATORY_CARE_PROVIDER_SITE_OTHER): Admitting: Licensed Clinical Social Worker

## 2024-06-13 DIAGNOSIS — F429 Obsessive-compulsive disorder, unspecified: Secondary | ICD-10-CM | POA: Diagnosis not present

## 2024-06-13 DIAGNOSIS — F331 Major depressive disorder, recurrent, moderate: Secondary | ICD-10-CM

## 2024-06-13 DIAGNOSIS — F411 Generalized anxiety disorder: Secondary | ICD-10-CM

## 2024-06-17 NOTE — Progress Notes (Signed)
 [facility] Counselor/Therapist Progress Note  Patient ID: Leonard [name], MRN: [id number]    Date: 06/13/2024  Time Spent: 0203  pm - 0302 pm : 59 Minutes  Treatment Type: Individual Therapy.  Treatment Type: Individual Therapy.   Reported Symptoms: Symptoms of anxiety, OCD, and facial tics   Mental Status Exam: Appearance:  Casual     Behavior: Appropriate  Motor: Normal  Speech/Language:  Clear and Coherent  Affect: Appropriate  Mood: normal  Thought process: normal  Thought content:   WNL  Sensory/Perceptual disturbances:   WNL  Orientation: oriented to person, place, time/date, situation, day of week, month of year, and year  Attention: Good  Concentration: Good  Memory: WNL  Fund of knowledge:  Good  Insight:   Good  Judgment:  Good  Impulse Control: Good    Risk Assessment: Danger to Self:  No Self-injurious Behavior: No Danger to Others: No Duty to Warn:no Physical Aggression / Violence:No  Access to Firearms a concern: No  Gang Involvement:No    Subjective:    Leonard [name] and his wife Leonard [name] participated in person at [place] location. Clinician presented from office,located at [facility]. [place] consented to treatment.   Leonard [name] and Leonard [name] presented together and report that he is struggling very bad. He has weaned himself off of his medications and states he spoke with his doctor about this. He states he has even taken steps to use unconventional methods such as herbs and mushrooms. Patient state she lacks motivation and nothing brings him joy. He reports that he struggles to complete basic daily task and although he has noticed a decline in tics he is struggling with his depression and anxiety. Leonard [name] reports that she has found a very big decline in in his symptoms and is concerned about him. She states they have a new grandchild coming and he is not even excited. Leonard [name] reports that he has an appointment with a  neuropsychologist at [facility] in a few weeks and is hoping they will be able to help him.   Clinician questioned patient about thoughts of harming himself and he fully denied SI/HI. He reports that he does feel hopeless sometimes but would never harm himself dur to not wanting to hurt himself or others. Clinician highly encouraged an appointment with both PCP and Psychiatrist so that they can be aware of medication absence and see what can be implemented to help improve symptoms. Clinician encouraged patient to set a routine and follow through as closely as possible and get out of the house at least 1 time daily. Clinician encouraged patient to call 911 and/or go to the ED if he has any thoughts of harming himself or others or notices an increase in his mental health symptoms.   Leonard [name] was pleasant and cooperative. Leonard [name] is motivated for treatment. He appeared to be definsive  at times when discussing treatment and medications. Leonard [name] will continue to provide support and encouragement. Leonard [name] will continue to utilize coping skills to decrease his anxiety and tics. He will continue to engage in bi weekly therapy sessions. Treatment planning to be reviewed by 12/06/2024.     Interventions: Cognitive Behavioral Therapy, Dialectical Behavioral Therapy, Motivational Interviewing, and Solution-Oriented/Positive Psychology   Diagnosis: Generalized Anxiety Disorder, Major Depressive Disorder, recurrent, moderate, OCD unspecified      Leonard [name] MSW, LCSW/DATE 06/13/2024

## 2024-06-22 ENCOUNTER — Other Ambulatory Visit: Payer: Self-pay | Admitting: Internal Medicine

## 2024-06-22 ENCOUNTER — Ambulatory Visit: Payer: Self-pay

## 2024-06-22 MED ORDER — OSELTAMIVIR PHOSPHATE 75 MG PO CAPS: 75.0000 mg | ORAL_CAPSULE | Freq: Two times a day (BID) | ORAL | 0 refills | Status: AC

## 2024-06-22 NOTE — Telephone Encounter (Signed)
 Spoke with the patient: Symptoms started yesterday with chills, cough, postnasal dripping. No chest pain or difficulty breathing.  Some headache. His wife was diagnosed with influenza before him. He sounded well over the phone. Plan: Tamiflu  Rx sent.  Rest, fluids, OTCs.  Urgent care if not gradually better.  He verbalized understanding

## 2024-06-22 NOTE — Telephone Encounter (Signed)
 FYI Only or Action Required?: Action required by provider: Tamiflu  request to pharmacy today.  Patient was last seen in primary care on 01/17/2024 by Leonard Aloysius BRAVO, MD.  Called Nurse Triage reporting Influenza.  Symptoms began yesterday.  Interventions attempted: Rest, hydration, or home remedies.  Symptoms are: gradually worsening.  Triage Disposition: Call PCP Within 24 Hours  Patient/caregiver understands and will follow disposition?: No, wishes to speak with PCP   Reason for Disposition  Patient is HIGH RISK (e.g., age > 64 years, pregnant, HIV+, or chronic medical condition)  Answer Assessment - Initial Assessment Questions Two home tests positive for flu A, requesting Tamiflu . Declines appointment.     1. WORST SYMPTOM: What is your worst symptom? (e.g., cough, runny nose, muscle aches, headache, sore throat, fever)      Sore throat 2. ONSET: When did your flu symptoms start?      06/21/24 late day  3. COUGH: How bad is the cough?       Mild  4. RESPIRATORY DISTRESS: Describe your breathing.      denies 5. FEVER: Do you have a fever? If Yes, ask: What is your temperature, how was it measured, and when did it start?     Felt feverish yesterday  6. EXPOSURE: Were you exposed to someone with influenza?       Yes, wife test positive few days ago  7. FLU VACCINE: Did you get a flu shot this year?     No-interested in receiving vaccine once recovered  8. HIGH RISK DISEASE: Do you have any chronic medical problems? (e.g., heart or lung disease, asthma, weak immune system, or other HIGH RISK conditions)      9. PREGNANCY: Is there any chance you are pregnant? When was your last menstrual period?      10. OTHER SYMPTOMS: Do you have any other symptoms?  (e.g., runny nose, muscle aches, headache, sore throat)       Mild headache, intermittent short of breath and chest tightness when laying down not worse than normal, states he always has chest tightness  and sob due to anxiety this doesn't feel any different.  Protocols used: Influenza (Flu) - Seasonal-A-AH  Message from [place] C sent at 06/22/2024  2:59 PM EST  Reason for Triage:Patient called in tested positive for the flu, patient wanted to know what to do if he can get tamiflu  would like a callback
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
## 2024-07-18 ENCOUNTER — Ambulatory Visit: Admitting: Licensed Clinical Social Worker

## 2024-07-24 ENCOUNTER — Encounter: Payer: Self-pay | Admitting: Neurology

## 2024-07-24 ENCOUNTER — Ambulatory Visit: Admitting: Neurology

## 2024-07-24 VITALS — BP 125/72 | HR 81 | Ht 72.0 in | Wt 238.0 lb

## 2024-07-24 DIAGNOSIS — F959 Tic disorder, unspecified: Secondary | ICD-10-CM | POA: Diagnosis not present
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
## 2024-08-01 ENCOUNTER — Encounter: Payer: Self-pay | Admitting: Cardiology

## 2024-08-01 ENCOUNTER — Ambulatory Visit: Admitting: Cardiology

## 2024-08-01 VITALS — BP 144/94 | HR 76 | Ht 72.0 in | Wt 241.2 lb

## 2024-08-01 DIAGNOSIS — R0989 Other specified symptoms and signs involving the circulatory and respiratory systems: Secondary | ICD-10-CM | POA: Diagnosis not present

## 2024-08-01 DIAGNOSIS — E785 Hyperlipidemia, unspecified: Secondary | ICD-10-CM | POA: Diagnosis not present

## 2024-08-01 DIAGNOSIS — I10 Essential (primary) hypertension: Secondary | ICD-10-CM

## 2024-08-01 DIAGNOSIS — I2510 Atherosclerotic heart disease of native coronary artery without angina pectoris: Secondary | ICD-10-CM | POA: Diagnosis not present

## 2024-08-01 MED ORDER — SPIRONOLACTONE 25 MG PO TABS: 25.0000 mg | ORAL_TABLET | Freq: Every day | ORAL | Status: AC

## 2024-08-01 NOTE — Patient Instructions (Addendum)
 Medication Instructions:  Continue Spironolactone  25 mg daily Take Clonidine  0.1 2 times daily  *If you need a refill on your cardiac medications before your next appointment, please call your pharmacy*  Lab Work: NONE If you have labs (blood work) drawn today and your tests are completely normal, you will receive your results only by: MyChart Message (if you have MyChart) OR A paper copy in the mail If you have any lab test that is abnormal or we need to change your treatment, we will call you to review the results.  Testing/Procedures: Carotid Dopplers  Follow-Up: At [facility], you and your health needs are our priority.  As part of our continuing mission to provide you with exceptional heart care, our providers are all part of one team.  This team includes your primary Cardiologist (physician) and Advanced Practice Providers or APPs (Physician Assistants and Nurse Practitioners) who all work together to provide you with the care you need, when you need it.  Your next appointment:   1 year(s)  Provider:   Lynwood Schilling, MD    We recommend signing up for the patient portal called MyChart.  Sign up information is provided on this After Visit Summary.  MyChart is used to connect with patients for Virtual Visits (Telemedicine).  Patients are able to view lab/test results, encounter notes, upcoming appointments, etc.  Non-urgent messages can be sent to your provider as well.   To learn more about what you can do with MyChart, go to [email or web address].

## 2024-08-14 ENCOUNTER — Ambulatory Visit: Admitting: Internal Medicine

## 2024-09-03 ENCOUNTER — Ambulatory Visit (HOSPITAL_COMMUNITY)

## 2024-11-06 ENCOUNTER — Ambulatory Visit
